# Patient Record
Sex: Male | Born: 1950
Health system: Southern US, Community
[De-identification: ages and names within clinical notes are randomized; demographics above are authoritative.]

## PROBLEM LIST (undated history)

## (undated) DIAGNOSIS — R079 Chest pain, unspecified: Secondary | ICD-10-CM

## (undated) DIAGNOSIS — E119 Type 2 diabetes mellitus without complications: Secondary | ICD-10-CM

## (undated) DIAGNOSIS — G4730 Sleep apnea, unspecified: Secondary | ICD-10-CM

## (undated) DIAGNOSIS — R011 Cardiac murmur, unspecified: Secondary | ICD-10-CM

## (undated) DIAGNOSIS — E349 Endocrine disorder, unspecified: Secondary | ICD-10-CM

## (undated) DIAGNOSIS — K219 Gastro-esophageal reflux disease without esophagitis: Secondary | ICD-10-CM

## (undated) DIAGNOSIS — I10 Essential (primary) hypertension: Secondary | ICD-10-CM

## (undated) DIAGNOSIS — R5383 Other fatigue: Secondary | ICD-10-CM

## (undated) DIAGNOSIS — Z79899 Other long term (current) drug therapy: Secondary | ICD-10-CM

## (undated) DIAGNOSIS — E669 Obesity, unspecified: Secondary | ICD-10-CM

## (undated) DIAGNOSIS — G4733 Obstructive sleep apnea (adult) (pediatric): Secondary | ICD-10-CM

## (undated) DIAGNOSIS — F419 Anxiety disorder, unspecified: Secondary | ICD-10-CM

## (undated) DIAGNOSIS — E559 Vitamin D deficiency, unspecified: Secondary | ICD-10-CM

## (undated) DIAGNOSIS — T7840XA Allergy, unspecified, initial encounter: Secondary | ICD-10-CM

## (undated) DIAGNOSIS — E785 Hyperlipidemia, unspecified: Secondary | ICD-10-CM

## (undated) DIAGNOSIS — K589 Irritable bowel syndrome without diarrhea: Secondary | ICD-10-CM

## (undated) DIAGNOSIS — I341 Nonrheumatic mitral (valve) prolapse: Secondary | ICD-10-CM

## (undated) HISTORY — DX: Cardiac murmur, unspecified: R01.1

## (undated) HISTORY — DX: Endocrine disorder, unspecified: E34.9

## (undated) HISTORY — DX: Type 2 diabetes mellitus without complications: E11.9

## (undated) HISTORY — DX: Allergy, unspecified, initial encounter: T78.40XA

## (undated) HISTORY — DX: Sleep apnea, unspecified: G47.30

## (undated) HISTORY — DX: Anxiety disorder, unspecified: F41.9

## (undated) HISTORY — DX: Other fatigue: R53.83

## (undated) HISTORY — DX: Nonrheumatic mitral (valve) prolapse: I34.1

## (undated) HISTORY — PX: WISDOM TOOTH EXTRACTION: SHX21

## (undated) HISTORY — DX: Gastro-esophageal reflux disease without esophagitis: K21.9

## (undated) HISTORY — DX: Obesity, unspecified: E66.9

## (undated) HISTORY — DX: Hyperlipidemia, unspecified: E78.5

## (undated) HISTORY — DX: Essential (primary) hypertension: I10

## (undated) HISTORY — DX: Irritable bowel syndrome, unspecified: K58.9

## (undated) HISTORY — DX: Other long term (current) drug therapy: Z79.899

## (undated) HISTORY — DX: Vitamin D deficiency, unspecified: E55.9

## (undated) HISTORY — DX: Chest pain, unspecified: R07.9

## (undated) HISTORY — PX: TONSILLECTOMY: SUR1361

## (undated) HISTORY — DX: Obstructive sleep apnea (adult) (pediatric): G47.33

## (undated) HISTORY — PX: COLONOSCOPY: SHX174

---

## 1999-08-29 ENCOUNTER — Ambulatory Visit: Admission: RE | Admit: 1999-08-29 | Discharge: 1999-08-29 | Payer: Self-pay | Admitting: Otolaryngology

## 2000-09-08 ENCOUNTER — Encounter: Payer: Self-pay | Admitting: Emergency Medicine

## 2000-09-08 ENCOUNTER — Emergency Department (HOSPITAL_COMMUNITY): Admission: EM | Admit: 2000-09-08 | Discharge: 2000-09-08 | Payer: Self-pay | Admitting: Emergency Medicine

## 2003-10-30 ENCOUNTER — Ambulatory Visit (HOSPITAL_COMMUNITY): Admission: RE | Admit: 2003-10-30 | Discharge: 2003-10-30 | Payer: Self-pay | Admitting: Internal Medicine

## 2005-06-23 ENCOUNTER — Ambulatory Visit (HOSPITAL_COMMUNITY): Admission: RE | Admit: 2005-06-23 | Discharge: 2005-06-23 | Payer: Self-pay | Admitting: Internal Medicine

## 2007-04-05 ENCOUNTER — Ambulatory Visit: Payer: Self-pay | Admitting: Gastroenterology

## 2007-06-28 ENCOUNTER — Ambulatory Visit: Payer: Self-pay | Admitting: Gastroenterology

## 2009-05-29 ENCOUNTER — Emergency Department (HOSPITAL_COMMUNITY): Admission: EM | Admit: 2009-05-29 | Discharge: 2009-05-29 | Payer: Self-pay | Admitting: Emergency Medicine

## 2010-07-31 LAB — POCT I-STAT, CHEM 8
BUN: 11 mg/dL (ref 6–23)
Calcium, Ion: 1.24 mmol/L (ref 1.12–1.32)
Chloride: 105 mEq/L (ref 96–112)
Creatinine, Ser: 0.9 mg/dL (ref 0.4–1.5)
Glucose, Bld: 113 mg/dL — ABNORMAL HIGH (ref 70–99)
HCT: 40 % (ref 39.0–52.0)
Hemoglobin: 13.6 g/dL (ref 13.0–17.0)
Potassium: 3.7 mEq/L (ref 3.5–5.1)
Sodium: 140 mEq/L (ref 135–145)
TCO2: 27 mmol/L (ref 0–100)

## 2012-02-23 ENCOUNTER — Other Ambulatory Visit (HOSPITAL_COMMUNITY): Payer: Self-pay | Admitting: Internal Medicine

## 2012-02-23 ENCOUNTER — Ambulatory Visit (HOSPITAL_COMMUNITY)
Admission: RE | Admit: 2012-02-23 | Discharge: 2012-02-23 | Disposition: A | Discharge status: Home / Self Care | Payer: 59 | Source: Ambulatory Visit | Attending: Internal Medicine | Admitting: Internal Medicine

## 2012-02-23 DIAGNOSIS — E119 Type 2 diabetes mellitus without complications: Secondary | ICD-10-CM | POA: Insufficient documentation

## 2012-02-23 DIAGNOSIS — J984 Other disorders of lung: Secondary | ICD-10-CM | POA: Insufficient documentation

## 2012-02-23 DIAGNOSIS — Z0000 Encounter for general adult medical examination without abnormal findings: Secondary | ICD-10-CM | POA: Insufficient documentation

## 2012-02-23 DIAGNOSIS — I10 Essential (primary) hypertension: Secondary | ICD-10-CM | POA: Insufficient documentation

## 2012-02-23 DIAGNOSIS — J9819 Other pulmonary collapse: Secondary | ICD-10-CM | POA: Insufficient documentation

## 2012-03-04 ENCOUNTER — Other Ambulatory Visit: Payer: Self-pay | Admitting: Internal Medicine

## 2012-03-04 ENCOUNTER — Ambulatory Visit
Admission: RE | Admit: 2012-03-04 | Discharge: 2012-03-04 | Disposition: A | Discharge status: Home / Self Care | Payer: 59 | Source: Ambulatory Visit | Attending: Internal Medicine | Admitting: Internal Medicine

## 2012-03-04 ENCOUNTER — Other Ambulatory Visit: Payer: 59

## 2012-03-04 DIAGNOSIS — R1011 Right upper quadrant pain: Secondary | ICD-10-CM

## 2012-06-11 ENCOUNTER — Encounter: Payer: Self-pay | Admitting: Gastroenterology

## 2012-12-10 ENCOUNTER — Encounter: Payer: Self-pay | Admitting: Gastroenterology

## 2013-01-17 ENCOUNTER — Encounter: Payer: Self-pay | Admitting: Cardiovascular Disease

## 2013-01-17 ENCOUNTER — Encounter: Payer: Self-pay | Admitting: *Deleted

## 2013-01-17 DIAGNOSIS — E782 Mixed hyperlipidemia: Secondary | ICD-10-CM | POA: Insufficient documentation

## 2013-01-17 DIAGNOSIS — K219 Gastro-esophageal reflux disease without esophagitis: Secondary | ICD-10-CM | POA: Insufficient documentation

## 2013-01-17 DIAGNOSIS — I10 Essential (primary) hypertension: Secondary | ICD-10-CM | POA: Insufficient documentation

## 2013-01-17 DIAGNOSIS — F419 Anxiety disorder, unspecified: Secondary | ICD-10-CM | POA: Insufficient documentation

## 2013-01-20 ENCOUNTER — Ambulatory Visit: Payer: 59 | Admitting: Cardiovascular Disease

## 2013-02-19 ENCOUNTER — Encounter: Payer: Self-pay | Admitting: Cardiovascular Disease

## 2013-02-20 ENCOUNTER — Ambulatory Visit (INDEPENDENT_AMBULATORY_CARE_PROVIDER_SITE_OTHER): Payer: 59 | Admitting: Cardiovascular Disease

## 2013-02-20 ENCOUNTER — Encounter: Payer: Self-pay | Admitting: Cardiovascular Disease

## 2013-02-20 ENCOUNTER — Encounter (INDEPENDENT_AMBULATORY_CARE_PROVIDER_SITE_OTHER): Payer: Self-pay

## 2013-02-20 VITALS — BP 122/70 | HR 84 | Ht 73.0 in | Wt 239.0 lb

## 2013-02-20 DIAGNOSIS — R0609 Other forms of dyspnea: Secondary | ICD-10-CM

## 2013-02-20 DIAGNOSIS — R0989 Other specified symptoms and signs involving the circulatory and respiratory systems: Secondary | ICD-10-CM

## 2013-02-20 DIAGNOSIS — R0600 Dyspnea, unspecified: Secondary | ICD-10-CM

## 2013-02-20 DIAGNOSIS — R079 Chest pain, unspecified: Secondary | ICD-10-CM

## 2013-02-20 DIAGNOSIS — I10 Essential (primary) hypertension: Secondary | ICD-10-CM

## 2013-02-20 DIAGNOSIS — R5383 Other fatigue: Secondary | ICD-10-CM

## 2013-02-20 DIAGNOSIS — E785 Hyperlipidemia, unspecified: Secondary | ICD-10-CM

## 2013-02-20 DIAGNOSIS — E119 Type 2 diabetes mellitus without complications: Secondary | ICD-10-CM

## 2013-02-20 DIAGNOSIS — R5381 Other malaise: Secondary | ICD-10-CM

## 2013-02-20 NOTE — Progress Notes (Signed)
Patient ID: Wayne [name], male   DOB: 1950-09-24, 62 y.o.   MRN: [id number] 62 yo referred for multiple atypical cardiac complaints and family history of premature CAD/CVA.  Patient describes having echo 20 years ago and having MVP.  Has infrequent palpitations with flip flops No sustained rapid beats.  Has sharp shooting pains on left side of chest not always with exercise. Has some exertional dyspnea.  Says he had a normal ETT 10 years ago.  Sounds like it may have been nuclear.  Been on statin for 4 years and takes baby aspirin  Last TC was 179 11/23/12 with LDL 124  A1c is 6.6 and is on metformin.  Wants to be more active but scared that something may happen to his heart    ROS: Denies fever, malais, weight loss, blurry vision, decreased visual acuity, cough, sputum, SOB, hemoptysis, pleuritic pain, palpitaitons, heartburn, abdominal pain, melena, lower extremity edema, claudication, or rash.  All other systems reviewed and negative   General: Affect appropriate Healthy:  appears stated age HEENT: normal Neck supple with no adenopathy JVP normal no bruits no thyromegaly Lungs clear with no wheezing and good diaphragmatic motion Heart:  S1/S2 no murmur,rub, gallop or click PMI normal Abdomen: benighn, BS positve, no tenderness, no AAA no bruit.  No HSM or HJR Distal pulses intact with no bruits No edema Neuro non-focal Skin warm and dry No muscular weakness  Medications Current Outpatient Prescriptions  Medication Sig Dispense Refill  . ALPRAZolam (XANAX) 1 MG tablet Take 1 mg by mouth at bedtime as needed.       Marland Kitchen aspirin 81 MG tablet Take 81 mg by mouth daily.      Marland Kitchen atenolol (TENORMIN) 100 MG tablet Take 1 tablet by mouth daily.      . Cholecalciferol (VITAMIN D3) 2000 UNITS capsule Take 2,000 Units by mouth daily.      . citalopram (CELEXA) 40 MG tablet Take 1 tablet by mouth daily.      . CRESTOR 10 MG tablet Take 1 tablet by mouth. On Monday, Wednesday & Friday      .  hyoscyamine (LEVBID) 0.375 MG 12 hr tablet Take 0.375 mg by mouth every 12 (twelve) hours as needed for cramping.      Marland Kitchen losartan-hydrochlorothiazide (HYZAAR) 100-25 MG per tablet       . Magnesium 400 MG CAPS Take by mouth daily.      . metFORMIN (GLUCOPHAGE-XR) 500 MG 24 hr tablet Take 1 tablet by mouth 2 (two) times daily.      . Probiotic Product (PROBIOTIC DAILY PO) Take by mouth daily.       No current facility-administered medications for this visit.    Allergies Review of patient's allergies indicates no known allergies.  Family History: Family History  Problem Relation Age of Onset  . Stroke    . Hypertension    . Hyperlipidemia    . Diabetes      Social History: History   Social History  . Marital Status: Married    Spouse Name: N/A    Number of Children: N/A  . Years of Education: N/A   Occupational History  . Not on file.   Social History Main Topics  . Smoking status: Former Games developer  . Smokeless tobacco: Not on file  . Alcohol Use: Yes     Comment: occasional  . Drug Use: No  . Sexual Activity: Not on file   Other Topics Concern  . Not on file  Social History Narrative  . No narrative on file    Electrocardiogram:  Assessment and Plan

## 2013-02-20 NOTE — Assessment & Plan Note (Signed)
Discussed low carb diet.  Target hemoglobin A1c is 6.5 or less.  Continue current medications.  

## 2013-02-20 NOTE — Patient Instructions (Signed)
Your physician recommends that you schedule a follow-up appointment in: AS  NEEDED Your physician recommends that you continue on your current medications as directed. Please refer to the Current Medication list given to you today.  Your physician has requested that you have an exercise tolerance test. For further information please visit [email or web address] Please also follow instruction sheet, as given.  Your physician has requested that you have an echocardiogram. Echocardiography is a painless test that uses sound waves to create images of your heart. It provides your doctor with information about the size and shape of your heart and how well your heart's chambers and valves are working. This procedure takes approximately one hour. There are no restrictions for this procedure.   CALCIUM  SCORE  OUT OF   POCKET  $150.00

## 2013-02-20 NOTE — Assessment & Plan Note (Addendum)
Atypical but multiple risk factors and wants to start exercise program  F/U ETT  Long term risk event can be assessed by calcium score and will help guide aggressiveness of chol and BS control

## 2013-02-20 NOTE — Assessment & Plan Note (Signed)
Dyspnea with history of MVP  F/u echo

## 2013-02-20 NOTE — Assessment & Plan Note (Signed)
If calcium score high may need higher dose of crestor or vytorin

## 2013-02-20 NOTE — Assessment & Plan Note (Signed)
Well controlled.  Continue current medications and low sodium Dash type diet.    

## 2013-04-02 ENCOUNTER — Ambulatory Visit (INDEPENDENT_AMBULATORY_CARE_PROVIDER_SITE_OTHER)
Admission: RE | Admit: 2013-04-02 | Discharge: 2013-04-02 | Disposition: A | Discharge status: Home / Self Care | Payer: 59 | Source: Ambulatory Visit | Attending: Cardiovascular Disease | Admitting: Cardiovascular Disease

## 2013-04-02 ENCOUNTER — Ambulatory Visit (INDEPENDENT_AMBULATORY_CARE_PROVIDER_SITE_OTHER): Payer: 59 | Admitting: Physician Assistant

## 2013-04-02 ENCOUNTER — Encounter: Payer: Self-pay | Admitting: Cardiovascular Disease

## 2013-04-02 ENCOUNTER — Ambulatory Visit (HOSPITAL_COMMUNITY): Payer: 59 | Attending: Cardiovascular Disease | Admitting: Radiology

## 2013-04-02 DIAGNOSIS — I10 Essential (primary) hypertension: Secondary | ICD-10-CM | POA: Insufficient documentation

## 2013-04-02 DIAGNOSIS — R0609 Other forms of dyspnea: Secondary | ICD-10-CM

## 2013-04-02 DIAGNOSIS — R5381 Other malaise: Secondary | ICD-10-CM | POA: Insufficient documentation

## 2013-04-02 DIAGNOSIS — R0602 Shortness of breath: Secondary | ICD-10-CM

## 2013-04-02 DIAGNOSIS — R0989 Other specified symptoms and signs involving the circulatory and respiratory systems: Secondary | ICD-10-CM | POA: Insufficient documentation

## 2013-04-02 DIAGNOSIS — R0600 Dyspnea, unspecified: Secondary | ICD-10-CM

## 2013-04-02 DIAGNOSIS — R079 Chest pain, unspecified: Secondary | ICD-10-CM

## 2013-04-02 DIAGNOSIS — Z8249 Family history of ischemic heart disease and other diseases of the circulatory system: Secondary | ICD-10-CM | POA: Insufficient documentation

## 2013-04-02 DIAGNOSIS — R5383 Other fatigue: Secondary | ICD-10-CM

## 2013-04-02 DIAGNOSIS — Z87891 Personal history of nicotine dependence: Secondary | ICD-10-CM | POA: Insufficient documentation

## 2013-04-02 DIAGNOSIS — E785 Hyperlipidemia, unspecified: Secondary | ICD-10-CM | POA: Insufficient documentation

## 2013-04-02 NOTE — Progress Notes (Signed)
Echocardiogram performed.  

## 2013-04-02 NOTE — Progress Notes (Signed)
Exercise Treadmill Test  Pre-Exercise Testing Evaluation Rhythm: normal sinus  Rate: 61 bpm     Test  Exercise Tolerance Test Ordering MD: Charlton Haws, MD  Interpreting MD: Tereso Newcomer PA-C  Unique Test No: 1  Treadmill:  1  Indication for ETT: chest pain - rule out ischemia  Contraindication to ETT: No   Stress Modality: exercise - treadmill  Cardiac Imaging Performed: non   Protocol: standard Bruce - maximal  Max BP:  154/65  Max MPHR (bpm):  160 85% MPR (bpm):  136  MPHR obtained (bpm):  137 % MPHR obtained:  85  Reached 85% MPHR (min:sec):  85 Total Exercise Time (min-sec):  9:00  Workload in METS:  10.1 Borg Scale: 15  Reason ETT Terminated:  patient's desire to stop    ST Segment Analysis At Rest: normal ST segments - no evidence of significant ST depression With Exercise: non-specific ST changes  Other Information Arrhythmia:  No Angina during ETT:  absent (0) Quality of ETT:  diagnostic  ETT Interpretation:  normal - no evidence of ischemia by ST analysis  Comments: Good exercise capacity. No chest pain. Normal BP response to exercise. No significant ST changes to suggest ischemia.  There were occasional PVCs and rare ventricular couplets.  Recommendations: F/u with Dr. Charlton Haws as directed. Signed,  Tereso Newcomer, PA-C   04/02/2013 3:11 PM

## 2013-04-06 ENCOUNTER — Other Ambulatory Visit: Payer: Self-pay | Admitting: Internal Medicine

## 2013-04-16 ENCOUNTER — Other Ambulatory Visit: Payer: Self-pay | Admitting: Emergency Medicine

## 2013-04-25 ENCOUNTER — Ambulatory Visit: Payer: Self-pay | Admitting: Emergency Medicine

## 2013-04-25 ENCOUNTER — Ambulatory Visit: Payer: Self-pay | Admitting: Internal Medicine

## 2013-05-01 ENCOUNTER — Encounter: Payer: Self-pay | Admitting: Internal Medicine

## 2013-05-13 ENCOUNTER — Other Ambulatory Visit: Payer: Self-pay | Admitting: Emergency Medicine

## 2013-05-13 ENCOUNTER — Other Ambulatory Visit: Payer: Self-pay | Admitting: *Deleted

## 2013-05-13 MED ORDER — HYOSCYAMINE SULFATE ER 0.375 MG PO TB12: 0.3750 mg | ORAL_TABLET | Freq: Two times a day (BID) | ORAL | Status: DC | PRN

## 2013-05-13 MED ORDER — CITALOPRAM HYDROBROMIDE 40 MG PO TABS: 40.0000 mg | ORAL_TABLET | Freq: Every day | ORAL | Status: DC

## 2013-05-13 MED ORDER — ATENOLOL 100 MG PO TABS: 100.0000 mg | ORAL_TABLET | Freq: Every day | ORAL | Status: DC

## 2013-05-13 MED ORDER — ROSUVASTATIN CALCIUM 10 MG PO TABS: 10.0000 mg | ORAL_TABLET | Freq: Every day | ORAL | Status: DC

## 2013-05-13 MED ORDER — METFORMIN HCL ER 500 MG PO TB24: ORAL_TABLET | ORAL | Status: DC

## 2013-06-12 DIAGNOSIS — N182 Chronic kidney disease, stage 2 (mild): Secondary | ICD-10-CM

## 2013-06-12 DIAGNOSIS — G4733 Obstructive sleep apnea (adult) (pediatric): Secondary | ICD-10-CM | POA: Insufficient documentation

## 2013-06-12 DIAGNOSIS — E1122 Type 2 diabetes mellitus with diabetic chronic kidney disease: Secondary | ICD-10-CM | POA: Insufficient documentation

## 2013-06-12 DIAGNOSIS — K589 Irritable bowel syndrome without diarrhea: Secondary | ICD-10-CM | POA: Insufficient documentation

## 2013-06-13 ENCOUNTER — Encounter: Payer: Self-pay | Admitting: Internal Medicine

## 2013-06-13 DIAGNOSIS — E559 Vitamin D deficiency, unspecified: Secondary | ICD-10-CM | POA: Insufficient documentation

## 2013-06-13 DIAGNOSIS — Z7189 Other specified counseling: Secondary | ICD-10-CM | POA: Insufficient documentation

## 2013-06-13 DIAGNOSIS — Z79899 Other long term (current) drug therapy: Secondary | ICD-10-CM

## 2013-06-13 HISTORY — DX: Other long term (current) drug therapy: Z79.899

## 2013-06-13 NOTE — Patient Instructions (Signed)

## 2013-06-13 NOTE — Progress Notes (Signed)
Patient ID: Wayne [name], male   DOB: [birth year], 63 y.o.   MRN: [id number]   This very nice 63 y.o. male presents for 3 month follow up with Hypertension, Hyperlipidemia, Pre-Diabetes and Vitamin D Deficiency.    HTN predates since   . BP has been controlled at home. Today's   . Patient denies any cardiac type chest pain, palpitations, dyspnea/orthopnea/PND, dizziness, claudication, or dependent edema.   Hyperlipidemia is controlled with diet & meds. Last Cholesterol was  , Triglycerides were    , HDL    and LDL    . Patient denies myalgias or other med SE's.    Also, the patient has history of PreDiabetes/insulin resistance since     with last A1c of    . Patient denies any symptoms of reactive hypoglycemia, diabetic polys, paresthesias or visual blurring.   Further, Patient has history of Vitamin D Deficiency with last vitamin D of   . Patient supplements vitamin D without any suspected side-effects.    Medication List       This list is accurate as of: 06/13/13  2:45 AM.  Always use your most recent med list.               ALPRAZolam 1 MG tablet  Commonly known as:  XANAX  Take 1 mg by mouth at bedtime as needed.     aspirin 81 MG tablet  Take 81 mg by mouth daily.     atenolol 100 MG tablet  Commonly known as:  TENORMIN  Take 1 tablet (100 mg total) by mouth daily.     citalopram 40 MG tablet  Commonly known as:  CELEXA  Take 1 tablet (40 mg total) by mouth daily.     hyoscyamine 0.375 MG 12 hr tablet  Commonly known as:  LEVBID  Take 1 tablet (0.375 mg total) by mouth every 12 (twelve) hours as needed for cramping.     losartan-hydrochlorothiazide 100-25 MG per tablet  Commonly known as:  HYZAAR  TAKE 1 TABLET BY MOUTH DAILY     Magnesium 400 MG Caps  Take by mouth daily.     metFORMIN 500 MG 24 hr tablet  Commonly known as:  GLUCOPHAGE-XR  Take 2 pills BID     PROBIOTIC DAILY PO  Take by mouth daily.     rosuvastatin 10 MG tablet  Commonly known as:   CRESTOR  Take 1 tablet (10 mg total) by mouth daily. On Monday, Wednesday & Friday     Vitamin D3 2000 UNITS capsule  Take 2,000 Units by mouth daily.         No Known Allergies  PMHx:   Past Medical History  Diagnosis Date  . Hyperlipemia   . Fatigue   . Chest pain   . GERD (gastroesophageal reflux disease)   . Anxiety     On Xanax  . Vitamin D deficiency   . OSA (obstructive sleep apnea)     Restated CPAP  . IBS (irritable bowel syndrome)   . HTN (hypertension)   . Type II or unspecified type diabetes mellitus without mention of complication, not stated as uncontrolled     FHx:    Reviewed / unchanged  SHx:    Reviewed / unchanged  Systems Review: Constitutional: Denies fever, chills, wt changes, headaches, insomnia, fatigue, night sweats, change in appetite. Eyes: Denies redness, blurred vision, diplopia, discharge, itchy, watery eyes.  ENT: Denies discharge, congestion, post nasal drip, epistaxis, sore throat, earache, hearing  loss, dental pain, tinnitus, vertigo, sinus pain, snoring.  CV: Denies chest pain, palpitations, irregular heartbeat, syncope, dyspnea, diaphoresis, orthopnea, PND, claudication, edema. Respiratory: denies cough, dyspnea, DOE, pleurisy, hoarseness, laryngitis, wheezing.  Gastrointestinal: Denies dysphagia, odynophagia, heartburn, reflux, water brash, abdominal pain or cramps, nausea, vomiting, bloating, diarrhea, constipation, hematemesis, melena, hematochezia,  or hemorrhoids. Genitourinary: Denies dysuria, frequency, urgency, nocturia, hesitancy, discharge, hematuria, flank pain. Musculoskeletal: Denies arthralgias, myalgias, stiffness, jt. swelling, pain, limp, strain/sprain.  Skin: Denies pruritus, rash, hives, warts, acne, eczema, change in skin lesion(s). Neuro: No weakness, tremor, incoordination, spasms, paresthesia, or pain. Psychiatric: Denies confusion, memory loss, or sensory loss. Endo: Denies change in weight, skin, hair change.   Heme/Lymph: No excessive bleeding, bruising, orenlarged lymph nodes.  There were no vitals filed for this visit.  Estimated body mass index is 31.54 kg/(m^2) as calculated from the following:   Height as of 02/20/13: 6\' 1"  (1.854 m).   Weight as of 02/20/13: 239 lb (108.41 kg).  On Exam: Appears well nourished - in no distress. Eyes: PERRLA, EOMs, conjunctiva no swelling or erythema. Sinuses: No frontal/maxillary tenderness ENT/Mouth: EAC's clear, TM's nl w/o erythema, bulging. Nares clear w/o erythema, swelling, exudates. Oropharynx clear without erythema or exudates. Oral hygiene is good. Tongue normal, non obstructing. Hearing intact.  Neck: Supple. Thyroid nl. Car 2+/2+ without bruits, nodes or JVD. Chest: Respirations nl with BS clear & equal w/o rales, rhonchi, wheezing or stridor.  Cor: Heart sounds normal w/ regular rate and rhythm without sig. murmurs, gallops, clicks, or rubs. Peripheral pulses normal and equal  without edema.  Abdomen: Soft & bowel sounds normal. Non-tender w/o guarding, rebound, hernias, masses, or organomegaly.  Lymphatics: Unremarkable.  Musculoskeletal: Full ROM all peripheral extremities, joint stability, 5/5 strength, and normal gait.  Skin: Warm, dry without exposed rashes, lesions, ecchymosis apparent.  Neuro: Cranial nerves intact, reflexes equal bilaterally. Sensory-motor testing grossly intact. Tendon reflexes grossly intact.  Pysch: Alert & oriented x 3. Insight and judgement nl & appropriate. No ideations.  Assessment and Plan:  1. Hypertension - Continue monitor blood pressure at home. Continue diet/meds same.  2. Hyperlipidemia - Continue diet/meds, exercise,& lifestyle modifications. Continue monitor periodic cholesterol/liver & renal functions   3. Pre-diabetes/Insulin Resistance - Continue diet, exercise, lifestyle modifications. Monitor appropriate labs.  3. Diabetes - continue recommend prudent low glycemic diet, weight control, regular  exercise, diabetic monitoring and periodic eye exams.  4. Vitamin D Deficiency - Continue supplementation.  Recommended regular exercise, BP monitoring, weight control, and discussed med and SE's. Recommended labs to assess and monitor clinical status. Further disposition pending results of labs.  This encounter was created in error - please disregard.

## 2013-06-16 ENCOUNTER — Encounter: Payer: Self-pay | Admitting: Internal Medicine

## 2013-06-16 ENCOUNTER — Encounter: Payer: Self-pay | Admitting: Physician Assistant

## 2013-07-18 ENCOUNTER — Ambulatory Visit (INDEPENDENT_AMBULATORY_CARE_PROVIDER_SITE_OTHER): Payer: 59 | Admitting: Internal Medicine

## 2013-07-18 ENCOUNTER — Encounter: Payer: Self-pay | Admitting: Internal Medicine

## 2013-07-18 VITALS — BP 104/76 | HR 68 | Temp 97.3°F | Resp 16 | Ht 78.0 in | Wt 237.8 lb

## 2013-07-18 DIAGNOSIS — Z79899 Other long term (current) drug therapy: Secondary | ICD-10-CM

## 2013-07-18 DIAGNOSIS — E559 Vitamin D deficiency, unspecified: Secondary | ICD-10-CM

## 2013-07-18 DIAGNOSIS — E785 Hyperlipidemia, unspecified: Secondary | ICD-10-CM

## 2013-07-18 DIAGNOSIS — I10 Essential (primary) hypertension: Secondary | ICD-10-CM

## 2013-07-18 DIAGNOSIS — E119 Type 2 diabetes mellitus without complications: Secondary | ICD-10-CM

## 2013-07-18 LAB — CBC WITH DIFFERENTIAL/PLATELET
BASOS ABS: 0.1 10*3/uL (ref 0.0–0.1)
BASOS PCT: 1 % (ref 0–1)
Eosinophils Absolute: 0.3 10*3/uL (ref 0.0–0.7)
Eosinophils Relative: 6 % — ABNORMAL HIGH (ref 0–5)
HEMATOCRIT: 40 % (ref 39.0–52.0)
Hemoglobin: 13.9 g/dL (ref 13.0–17.0)
LYMPHS PCT: 55 % — AB (ref 12–46)
Lymphs Abs: 3.2 10*3/uL (ref 0.7–4.0)
MCH: 31.2 pg (ref 26.0–34.0)
MCHC: 34.8 g/dL (ref 30.0–36.0)
MCV: 89.9 fL (ref 78.0–100.0)
Monocytes Absolute: 0.4 10*3/uL (ref 0.1–1.0)
Monocytes Relative: 7 % (ref 3–12)
NEUTROS ABS: 1.8 10*3/uL (ref 1.7–7.7)
NEUTROS PCT: 31 % — AB (ref 43–77)
Platelets: 315 10*3/uL (ref 150–400)
RBC: 4.45 MIL/uL (ref 4.22–5.81)
RDW: 13.3 % (ref 11.5–15.5)
WBC: 5.8 10*3/uL (ref 4.0–10.5)

## 2013-07-18 LAB — HEMOGLOBIN A1C
Hgb A1c MFr Bld: 6.8 % — ABNORMAL HIGH (ref <5.7)
MEAN PLASMA GLUCOSE: 148 mg/dL — AB (ref <117)

## 2013-07-18 NOTE — Patient Instructions (Signed)

## 2013-07-18 NOTE — Progress Notes (Signed)
Patient ID: Wayne [name], male   DOB: [birth year], 63 y.o.   MRN: [id number]    This very nice 63 y.o. MBM presents for 3 month follow up with Hypertension, Hyperlipidemia, OSA/CPAP,  T2 NIDDM and Vitamin D Deficiency.    HTN predates since 1989. BP has been controlled at home. Today's BP: 104/76 mmHg . In Nov 2014 he had a normal 2DEC and negative ETT by Dr Melburn Popper. Patient denies any cardiac type chest pain, palpitations, dyspnea/orthopnea/PND, dizziness, claudication, or dependent edema.   Hyperlipidemia is not controlled with diet & meds. Last Cholesterol was 179 , Triglycerides were 91, HDL 37  and LDL 124 in July 2014 - not at goal. Patient denies myalgias or other med SE's.    Also, the patient has history of Obesity (BMI 32) T2 NIDDM since 2007 with last A1c of 6.6% in July 2014.  GFR was 79 in July 2014 consistent with Stage I CKD. He infrequently checks blood sugars . Patient denies any symptoms of reactive hypoglycemia, diabetic polys, paresthesias or visual blurring.   Further, Patient has history of Vitamin D Deficiency of 22 in 2008 with last vitamin D of 79 in July 2014. Patient supplements vitamin D without any suspected side-effects.    Medication List       This list is accurate as of: 07/18/13  3:31 PM.  Always use your most recent med list.               ALPRAZolam 1 MG tablet  Commonly known as:  XANAX  Take 1 mg by mouth at bedtime as needed.     aspirin 81 MG tablet  Take 81 mg by mouth daily.     atenolol 100 MG tablet  Commonly known as:  TENORMIN  Take 1 tablet (100 mg total) by mouth daily.     citalopram 40 MG tablet  Commonly known as:  CELEXA  Take 1 tablet (40 mg total) by mouth daily.     hyoscyamine 0.375 MG 12 hr tablet  Commonly known as:  LEVBID  Take 1 tablet (0.375 mg total) by mouth every 12 (twelve) hours as needed for cramping.     losartan-hydrochlorothiazide 100-25 MG per tablet  Commonly known as:  HYZAAR  TAKE 1 TABLET BY MOUTH DAILY      Magnesium 400 MG Caps  Take by mouth 2 (two) times daily.     metFORMIN 500 MG 24 hr tablet  Commonly known as:  GLUCOPHAGE-XR  Take 2 pills BID     PROBIOTIC DAILY PO  Take by mouth daily.     rosuvastatin 10 MG tablet  Commonly known as:  CRESTOR  Take 1 tablet (10 mg total) by mouth daily. On Monday, Wednesday & Friday     Vitamin D3 2000 UNITS capsule  Take 2,000 Units by mouth daily.         No Known Allergies  PMHx:   Past Medical History  Diagnosis Date  . Hyperlipemia   . Fatigue   . Chest pain   . GERD (gastroesophageal reflux disease)   . Anxiety     On Xanax  . Vitamin D deficiency   . OSA (obstructive sleep apnea)     Restated CPAP  . IBS (irritable bowel syndrome)   . HTN (hypertension)   . Type II or unspecified type diabetes mellitus without mention of complication, not stated as uncontrolled     FHx:    Reviewed / unchanged  SHx:  Reviewed / unchanged  Systems Review: Constitutional: Denies fever, chills, wt changes, headaches, insomnia, fatigue, night sweats, change in appetite. Eyes: Denies redness, blurred vision, diplopia, discharge, itchy, watery eyes.  ENT: Denies discharge, congestion, post nasal drip, epistaxis, sore throat, earache, hearing loss, dental pain, tinnitus, vertigo, sinus pain, snoring.  CV: Denies chest pain, palpitations, irregular heartbeat, syncope, dyspnea, diaphoresis, orthopnea, PND, claudication, edema. Respiratory: denies cough, dyspnea, DOE, pleurisy, hoarseness, laryngitis, wheezing.  Gastrointestinal: Denies dysphagia, odynophagia, heartburn, reflux, water brash, abdominal pain or cramps, nausea, vomiting, bloating, diarrhea, constipation, hematemesis, melena, hematochezia,  or hemorrhoids. Genitourinary: Denies dysuria, frequency, urgency, nocturia, hesitancy, discharge, hematuria, flank pain. Musculoskeletal: Denies arthralgias, myalgias, stiffness, jt. swelling, pain, limp, strain/sprain.  Skin: Denies  pruritus, rash, hives, warts, acne, eczema, change in skin lesion(s). Neuro: No weakness, tremor, incoordination, spasms, paresthesia, or pain. Psychiatric: Denies confusion, memory loss, or sensory loss. Endo: Denies change in weight, skin, hair change.  Heme/Lymph: No excessive bleeding, bruising, orenlarged lymph nodes.  BP: 104/76  Pulse: 68  Temp: 97.3 F (36.3 C)  Resp: 16    Estimated body mass index is 27.49 kg/(m^2) as calculated from the following:   Height as of this encounter: 6\' 6"  (1.981 m).   Weight as of this encounter: 237 lb 12.8 oz (107.865 kg).  On Exam: Appears well nourished - in no distress. Eyes: PERRLA, EOMs, conjunctiva no swelling or erythema. Sinuses: No frontal/maxillary tenderness ENT/Mouth: EAC's clear, TM's nl w/o erythema, bulging. Nares clear w/o erythema, swelling, exudates. Oropharynx clear without erythema or exudates. Oral hygiene is good. Tongue normal, non obstructing. Hearing intact.  Neck: Supple. Thyroid nl. Car 2+/2+ without bruits, nodes or JVD. Chest: Respirations nl with BS clear & equal w/o rales, rhonchi, wheezing or stridor.  Cor: Heart sounds normal w/ regular rate and rhythm without sig. murmurs, gallops, clicks, or rubs. Peripheral pulses normal and equal  without edema.  Abdomen: Soft & bowel sounds normal. Non-tender w/o guarding, rebound, hernias, masses, or organomegaly.  Lymphatics: Unremarkable.  Musculoskeletal: Full ROM all peripheral extremities, joint stability, 5/5 strength, and normal gait.  Skin: Warm, dry without exposed rashes, lesions, ecchymosis apparent.  Neuro: Cranial nerves intact, reflexes equal bilaterally. Sensory-motor testing grossly intact. Tendon reflexes grossly intact.  Pysch: Alert & oriented x 3. Insight and judgement nl & appropriate. No ideations.  Assessment and Plan:  1. Hypertension - at goal - Continue monitor blood pressure at home. Continue diet/meds same.  2. Hyperlipidemia - not at goal  - Continue diet/meds, exercise,& lifestyle modifications. Continue monitor periodic cholesterol/liver & renal functions   3. T2 NIDDM w/ Stage I CKD- Continue recommend prudent low glycemic diet, weight control, regular exercise, diabetic monitoring and periodic eye exams.  4. Vitamin D Deficiency - Continue supplementation.  5. IBS  Recommended regular exercise, BP monitoring, weight control, and discussed med and SE's. Recommended labs to assess and monitor clinical status. Further disposition pending results of labs.

## 2013-07-19 LAB — HEPATIC FUNCTION PANEL
ALK PHOS: 51 U/L (ref 39–117)
ALT: 16 U/L (ref 0–53)
AST: 17 U/L (ref 0–37)
Albumin: 4.3 g/dL (ref 3.5–5.2)
BILIRUBIN DIRECT: 0.1 mg/dL (ref 0.0–0.3)
BILIRUBIN INDIRECT: 0.5 mg/dL (ref 0.2–1.2)
BILIRUBIN TOTAL: 0.6 mg/dL (ref 0.2–1.2)
TOTAL PROTEIN: 7 g/dL (ref 6.0–8.3)

## 2013-07-19 LAB — LIPID PANEL
CHOLESTEROL: 158 mg/dL (ref 0–200)
HDL: 36 mg/dL — AB (ref >39)
LDL CALC: 104 mg/dL — AB (ref 0–99)
TRIGLYCERIDES: 90 mg/dL (ref <150)
Total CHOL/HDL Ratio: 4.4 Ratio
VLDL: 18 mg/dL (ref 0–40)

## 2013-07-19 LAB — BASIC METABOLIC PANEL WITH GFR
BUN: 17 mg/dL (ref 6–23)
CHLORIDE: 101 meq/L (ref 96–112)
CO2: 31 mEq/L (ref 19–32)
Calcium: 9.9 mg/dL (ref 8.4–10.5)
Creat: 0.88 mg/dL (ref 0.50–1.35)
Glucose, Bld: 99 mg/dL (ref 70–99)
POTASSIUM: 4.2 meq/L (ref 3.5–5.3)
SODIUM: 140 meq/L (ref 135–145)

## 2013-07-19 LAB — TSH: TSH: 1.262 u[IU]/mL (ref 0.350–4.500)

## 2013-07-19 LAB — VITAMIN D 25 HYDROXY (VIT D DEFICIENCY, FRACTURES): Vit D, 25-Hydroxy: 61 ng/mL (ref 30–89)

## 2013-07-19 LAB — MAGNESIUM: Magnesium: 1.6 mg/dL (ref 1.5–2.5)

## 2013-07-19 LAB — INSULIN, FASTING: Insulin fasting, serum: 20 u[IU]/mL (ref 3–28)

## 2013-08-18 ENCOUNTER — Other Ambulatory Visit: Payer: Self-pay | Admitting: Emergency Medicine

## 2013-09-04 ENCOUNTER — Other Ambulatory Visit: Payer: Self-pay | Admitting: Emergency Medicine

## 2013-09-05 ENCOUNTER — Ambulatory Visit (INDEPENDENT_AMBULATORY_CARE_PROVIDER_SITE_OTHER): Payer: 59 | Admitting: Emergency Medicine

## 2013-09-05 ENCOUNTER — Encounter: Payer: Self-pay | Admitting: Emergency Medicine

## 2013-09-05 VITALS — BP 130/72 | HR 80 | Temp 98.6°F | Resp 18 | Ht 72.0 in | Wt 240.0 lb

## 2013-09-05 DIAGNOSIS — L259 Unspecified contact dermatitis, unspecified cause: Secondary | ICD-10-CM

## 2013-09-05 DIAGNOSIS — L309 Dermatitis, unspecified: Secondary | ICD-10-CM

## 2013-09-05 MED ORDER — AZITHROMYCIN 250 MG PO TABS: ORAL_TABLET | ORAL | Status: AC

## 2013-09-05 MED ORDER — PREDNISONE 10 MG PO TABS: ORAL_TABLET | ORAL | Status: DC

## 2013-09-05 MED ORDER — CLOTRIMAZOLE-BETAMETHASONE 1-0.05 % EX CREA: 1.0000 "application " | TOPICAL_CREAM | Freq: Two times a day (BID) | CUTANEOUS | Status: DC

## 2013-09-05 NOTE — Patient Instructions (Signed)
Folliculitis  Folliculitis is redness, soreness, and swelling (inflammation) of the hair follicles. This condition can occur anywhere on the body. People with weakened immune systems, diabetes, or obesity have a greater risk of getting folliculitis. CAUSES  Bacterial infection. This is the most common cause.  Fungal infection.  Viral infection.  Contact with certain chemicals, especially oils and tars. Long-term folliculitis can result from bacteria that live in the nostrils. The bacteria may trigger multiple outbreaks of folliculitis over time. SYMPTOMS Folliculitis most commonly occurs on the scalp, thighs, legs, back, buttocks, and areas where hair is shaved frequently. An early sign of folliculitis is a small, white or yellow, pus-filled, itchy lesion (pustule). These lesions appear on a red, inflamed follicle. They are usually less than 0.2 inches (5 mm) wide. When there is an infection of the follicle that goes deeper, it becomes a boil or furuncle. A group of closely packed boils creates a larger lesion (carbuncle). Carbuncles tend to occur in hairy, sweaty areas of the body. DIAGNOSIS  Your caregiver can usually tell what is wrong by doing a physical exam. A sample may be taken from one of the lesions and tested in a lab. This can help determine what is causing your folliculitis. TREATMENT  Treatment may include:  Applying warm compresses to the affected areas.  Taking antibiotic medicines orally or applying them to the skin.  Draining the lesions if they contain a large amount of pus or fluid.  Laser hair removal for cases of long-lasting folliculitis. This helps to prevent regrowth of the hair. HOME CARE INSTRUCTIONS  Apply warm compresses to the affected areas as directed by your caregiver.  If antibiotics are prescribed, take them as directed. Finish them even if you start to feel better.  You may take over-the-counter medicines to relieve itching.  Do not shave  irritated skin.  Follow up with your caregiver as directed. SEEK IMMEDIATE MEDICAL CARE IF:   You have increasing redness, swelling, or pain in the affected area.  You have a fever. MAKE SURE YOU:  Understand these instructions.  Will watch your condition.  Will get help right away if you are not doing well or get worse. Document Released: 07/10/2001 Document Revised: 10/31/2011 Document Reviewed: 08/01/2011 [employer] Patient Information 2014 [employer].  

## 2013-09-05 NOTE — Progress Notes (Signed)
   Subjective:    Patient ID: Wayne [name], male    DOB: 07/20/1950, 63 y.o.   MRN: [id number]  HPI Comments: 63 yo male with rash on back of neck x 1 week. He has had similar type rash in past and was treated with Zpak, Clotrimazole/ betamethasone with relief. He notes he shaved his head then went out to clean gutters before rash came. He also notes left hand has been itching since then.   Rash   Current Outpatient Prescriptions on File Prior to Visit  Medication Sig Dispense Refill  . ALPRAZolam (XANAX) 1 MG tablet Take 1 mg by mouth at bedtime as needed.       Marland Kitchen. aspirin 81 MG tablet Take 81 mg by mouth daily.      Marland Kitchen. atenolol (TENORMIN) 100 MG tablet Take 1 tablet (100 mg total) by mouth daily.  90 tablet  0  . Cholecalciferol (VITAMIN D3) 2000 UNITS capsule Take 2,000 Units by mouth daily.      . citalopram (CELEXA) 40 MG tablet Take 1 tablet (40 mg total) by mouth daily.  90 tablet  0  . hyoscyamine (LEVBID) 0.375 MG 12 hr tablet Take 1 tablet (0.375 mg total) by mouth every 12 (twelve) hours as needed for cramping.  90 tablet  0  . losartan-hydrochlorothiazide (HYZAAR) 100-25 MG per tablet TAKE 1 TABLET BY MOUTH EVERY DAY  90 tablet  0  . Magnesium 400 MG CAPS Take by mouth 2 (two) times daily.       . metFORMIN (GLUCOPHAGE-XR) 500 MG 24 hr tablet Take 2 pills BID  360 tablet  0  . Probiotic Product (PROBIOTIC DAILY PO) Take by mouth daily.      . rosuvastatin (CRESTOR) 10 MG tablet Take 1 tablet (10 mg total) by mouth daily. On Monday, Wednesday & Friday  90 tablet  0   No current facility-administered medications on file prior to visit.   No Known Allergies Past Medical History  Diagnosis Date  . Hyperlipemia   . Fatigue   . Chest pain   . GERD (gastroesophageal reflux disease)   . Anxiety     On Xanax  . Vitamin D deficiency   . OSA (obstructive sleep apnea)     Restated CPAP  . IBS (irritable bowel syndrome)   . HTN (hypertension)   . Type II or unspecified type  diabetes mellitus without mention of complication, not stated as uncontrolled       Review of Systems  Skin: Positive for rash.  All other systems reviewed and are negative.  BP 130/72  Pulse 80  Temp(Src) 98.6 F (37 C) (Temporal)  Resp 18  Ht 6' (1.829 m)  Wt 240 lb (108.863 kg)  BMI 32.54 kg/m2     Objective:   Physical Exam  Nursing note and vitals reviewed. Constitutional: He is oriented to person, place, and time. He appears well-developed and well-nourished.  HENT:  Head: Normocephalic and atraumatic.    Right Ear: External ear normal.  Left Ear: External ear normal.  Neurological: He is alert and oriented to person, place, and time.  Skin: Skin is warm and dry. Rash noted.  Psychiatric: He has a normal mood and affect. Judgment normal.          Assessment & Plan:  Folliculitis/Dermatitis- Prob related to shaving head. Advised of hygiene. Pred Dp 10 mg, Lotrisone cream, Zpak all AD

## 2013-10-10 ENCOUNTER — Other Ambulatory Visit: Payer: Self-pay | Admitting: Emergency Medicine

## 2013-10-24 ENCOUNTER — Ambulatory Visit: Payer: Self-pay | Admitting: Emergency Medicine

## 2013-11-28 ENCOUNTER — Other Ambulatory Visit: Payer: Self-pay | Admitting: Internal Medicine

## 2013-12-08 ENCOUNTER — Other Ambulatory Visit: Payer: Self-pay | Admitting: Emergency Medicine

## 2014-01-05 ENCOUNTER — Other Ambulatory Visit: Payer: Self-pay | Admitting: Emergency Medicine

## 2014-01-09 ENCOUNTER — Encounter: Payer: Self-pay | Admitting: Physician Assistant

## 2014-01-09 ENCOUNTER — Ambulatory Visit (INDEPENDENT_AMBULATORY_CARE_PROVIDER_SITE_OTHER): Payer: 59 | Admitting: Physician Assistant

## 2014-01-09 VITALS — BP 120/80 | HR 72 | Temp 97.7°F | Resp 16 | Ht 72.0 in | Wt 243.0 lb

## 2014-01-09 DIAGNOSIS — M26609 Unspecified temporomandibular joint disorder, unspecified side: Secondary | ICD-10-CM

## 2014-01-09 DIAGNOSIS — J069 Acute upper respiratory infection, unspecified: Secondary | ICD-10-CM

## 2014-01-09 MED ORDER — AZITHROMYCIN 250 MG PO TABS: ORAL_TABLET | ORAL | Status: AC

## 2014-01-09 MED ORDER — BACLOFEN 10 MG PO TABS: 10.0000 mg | ORAL_TABLET | Freq: Two times a day (BID) | ORAL | Status: DC

## 2014-01-09 NOTE — Patient Instructions (Signed)
Add an allergy pill from over the counter like allegra, zyrtec, or claritin daily If you get worse fever, green mucus then get on zpak  Tylenol is safe for you blood pressure Aleve, advil, ibuprofen can increase your blood pressure but are safe to take for small amount of time for pain with food.   What is the TMJ? The temporomandibular (tem-PUH-ro-man-DIB-yoo-ler) joint, or the TMJ, connects the upper and lower jawbones. This joint allows the jaw to open wide and move back and forth when you chew, talk, or yawn.There are also several muscles that help this joint move. There can be muscle tightness and pain in the muscle that can cause several symptoms.  What causes TMJ pain? There are many causes of TMJ pain. Repeated chewing (for example, chewing gum) and clenching your teeth can cause pain in the joint. Some TMJ pain has no obvious cause. What can I do to ease the pain? There are many things you can do to help your pain get better. When you have pain:  Eat soft foods and stay away from chewy foods (for example, taffy) Try to use both sides of your mouth to chew Don't chew gum Don't open your mouth wide (for example, during yawning or singing) Don't bite your cheeks or fingernails Lower your amount of stress and worry Massage Can take the muscle relaxer as needed AT NIGHT Applying a warm, damp washcloth to the joint may help. Over-the-counter pain medicines such as ibuprofen (one brand: Advil) or acetaminophen (one brand: Tylenol) might also help. Do not use these medicines if you are allergic to them or if your doctor told you not to use them. How can I stop the pain from coming back? When your pain is better, you can do these exercises to make your muscles stronger and to keep the pain from coming back:  Resisted mouth opening: Place your thumb or two fingers under your chin and open your mouth slowly, pushing up lightly on your chin with your thumb. Hold for three to six seconds. Close  your mouth slowly. Resisted mouth closing: Place your thumbs under your chin and your two index fingers on the ridge between your mouth and the bottom of your chin. Push down lightly on your chin as you close your mouth. Tongue up: Slowly open and close your mouth while keeping the tongue touching the roof of the mouth. Side-to-side jaw movement: Place an object about one fourth of an inch thick (for example, two tongue depressors) between your front teeth. Slowly move your jaw from side to side. Increase the thickness of the object as the exercise becomes easier Forward jaw movement: Place an object about one fourth of an inch thick between your front teeth and move the bottom jaw forward so that the bottom teeth are in front of the top teeth. Increase the thickness of the object as the exercise becomes easier. These exercises should not be painful. If it hurts to do these exercises, stop doing them and talk to your family doctor.

## 2014-01-09 NOTE — Progress Notes (Signed)
   Subjective:    Patient ID: Wayne [name], male    DOB: Nov 20, 1950, 63 y.o.   MRN: [id number]  Sore Throat  This is a new problem. The current episode started in the past 7 days (went to the dentist first but they told him to go tto PCP). The problem has been unchanged. Associated symptoms include congestion, ear pain (left ear) and headaches (left sided headache). Pertinent negatives include no abdominal pain, coughing, diarrhea, drooling, ear discharge, hoarse voice, plugged ear sensation, neck pain, shortness of breath, stridor, swollen glands, trouble swallowing or vomiting. He has tried nothing for the symptoms.      Review of Systems  Constitutional: Negative for fever, chills and diaphoresis.  HENT: Positive for congestion, ear pain (left ear), postnasal drip and rhinorrhea. Negative for drooling, ear discharge, hoarse voice, sinus pressure, sneezing, sore throat, trouble swallowing and voice change.   Eyes: Negative.   Respiratory: Negative for cough, chest tightness, shortness of breath, wheezing and stridor.   Cardiovascular: Negative.   Gastrointestinal: Negative.  Negative for vomiting, abdominal pain and diarrhea.  Genitourinary: Negative.   Musculoskeletal: Negative.  Negative for neck pain.  Neurological: Positive for headaches (left sided headache).       Objective:   Physical Exam  Constitutional: He appears well-developed and well-nourished.  HENT:  Head: Normocephalic and atraumatic.  Right Ear: External ear normal.  Left Ear: External ear normal.  Nose: Nose normal.  Mouth/Throat: Oropharynx is clear and moist. No oropharyngeal exudate.  + TMJ left ear  Eyes: Conjunctivae are normal. Pupils are equal, round, and reactive to light.  Neck: Normal range of motion. Neck supple.  Cardiovascular: Normal rate and regular rhythm.   Pulmonary/Chest: Effort normal and breath sounds normal.  Abdominal: Soft. Bowel sounds are normal.  Lymphadenopathy:    He has no  cervical adenopathy.      Assessment & Plan:  Viral versus TMJ- add allergy pill, will give zpak in case fever worse, likely TMJ too, information given to the patient, no gum/decrease hard foods, warm wet wash clothes, decrease stress, talk with dentist about possible night guard, can do massage, and exercise.

## 2014-01-20 ENCOUNTER — Other Ambulatory Visit: Payer: Self-pay | Admitting: Physician Assistant

## 2014-01-20 MED ORDER — CITALOPRAM HYDROBROMIDE 40 MG PO TABS: ORAL_TABLET | ORAL | Status: DC

## 2014-01-30 ENCOUNTER — Ambulatory Visit (INDEPENDENT_AMBULATORY_CARE_PROVIDER_SITE_OTHER): Payer: 59 | Admitting: Internal Medicine

## 2014-01-30 ENCOUNTER — Encounter: Payer: Self-pay | Admitting: Internal Medicine

## 2014-01-30 VITALS — BP 110/72 | HR 76 | Temp 97.6°F | Resp 16 | Ht 72.5 in | Wt 239.2 lb

## 2014-01-30 DIAGNOSIS — R7401 Elevation of levels of liver transaminase levels: Secondary | ICD-10-CM

## 2014-01-30 DIAGNOSIS — Z113 Encounter for screening for infections with a predominantly sexual mode of transmission: Secondary | ICD-10-CM

## 2014-01-30 DIAGNOSIS — Z125 Encounter for screening for malignant neoplasm of prostate: Secondary | ICD-10-CM

## 2014-01-30 DIAGNOSIS — K219 Gastro-esophageal reflux disease without esophagitis: Secondary | ICD-10-CM

## 2014-01-30 DIAGNOSIS — K589 Irritable bowel syndrome without diarrhea: Secondary | ICD-10-CM

## 2014-01-30 DIAGNOSIS — E785 Hyperlipidemia, unspecified: Secondary | ICD-10-CM

## 2014-01-30 DIAGNOSIS — Z111 Encounter for screening for respiratory tuberculosis: Secondary | ICD-10-CM

## 2014-01-30 DIAGNOSIS — I10 Essential (primary) hypertension: Secondary | ICD-10-CM

## 2014-01-30 DIAGNOSIS — R7402 Elevation of levels of lactic acid dehydrogenase (LDH): Secondary | ICD-10-CM

## 2014-01-30 DIAGNOSIS — Z0000 Encounter for general adult medical examination without abnormal findings: Secondary | ICD-10-CM

## 2014-01-30 DIAGNOSIS — R740 Nonspecific elevation of levels of transaminase and lactic acid dehydrogenase [LDH]: Secondary | ICD-10-CM

## 2014-01-30 DIAGNOSIS — E119 Type 2 diabetes mellitus without complications: Secondary | ICD-10-CM

## 2014-01-30 DIAGNOSIS — G4733 Obstructive sleep apnea (adult) (pediatric): Secondary | ICD-10-CM

## 2014-01-30 DIAGNOSIS — Z23 Encounter for immunization: Secondary | ICD-10-CM

## 2014-01-30 DIAGNOSIS — E559 Vitamin D deficiency, unspecified: Secondary | ICD-10-CM

## 2014-01-30 DIAGNOSIS — Z1212 Encounter for screening for malignant neoplasm of rectum: Secondary | ICD-10-CM

## 2014-01-30 LAB — CBC WITH DIFFERENTIAL/PLATELET
Basophils Absolute: 0 10*3/uL (ref 0.0–0.1)
Basophils Relative: 1 % (ref 0–1)
Eosinophils Absolute: 0.2 10*3/uL (ref 0.0–0.7)
Eosinophils Relative: 5 % (ref 0–5)
HCT: 38 % — ABNORMAL LOW (ref 39.0–52.0)
HEMOGLOBIN: 13.2 g/dL (ref 13.0–17.0)
LYMPHS ABS: 2.8 10*3/uL (ref 0.7–4.0)
Lymphocytes Relative: 58 % — ABNORMAL HIGH (ref 12–46)
MCH: 30.5 pg (ref 26.0–34.0)
MCHC: 34.7 g/dL (ref 30.0–36.0)
MCV: 87.8 fL (ref 78.0–100.0)
MONOS PCT: 6 % (ref 3–12)
Monocytes Absolute: 0.3 10*3/uL (ref 0.1–1.0)
NEUTROS ABS: 1.5 10*3/uL — AB (ref 1.7–7.7)
NEUTROS PCT: 30 % — AB (ref 43–77)
Platelets: 324 10*3/uL (ref 150–400)
RBC: 4.33 MIL/uL (ref 4.22–5.81)
RDW: 13.4 % (ref 11.5–15.5)
WBC: 4.9 10*3/uL (ref 4.0–10.5)

## 2014-01-30 LAB — HEMOGLOBIN A1C
HEMOGLOBIN A1C: 7.3 % — AB (ref <5.7)
Mean Plasma Glucose: 163 mg/dL — ABNORMAL HIGH (ref <117)

## 2014-01-30 NOTE — Progress Notes (Signed)
Patient ID: Wayne [name], male   DOB: [birth year], 63 y.o.   MRN: [id number]  Annual Preventative Comprehensive Examination  This very nice 63 y.o.mbm presents for complete physical.  Patient has been followed for HTN, T2_NIDDM, Hyperlipidemia, and Vitamin D Deficiency.   HTN predates since 1989. Patient's BP has been controlled at home.Today's BP: 110/72 mmHg. Patient denies any cardiac symptoms as chest pain, palpitations, shortness of breath, dizziness or ankle swelling.   Patient's hyperlipidemia is controlled with diet and medications. Patient denies myalgias or other medication SE's. Last lipids were Total Chol 158; HDL 36*; LDL 104*; Trig 90 on 07/18/2013.   Patient has T2_NIDDM (2007) and Stage 2 CKD with GFR 79 ml/min and patient denies reactive hypoglycemic symptoms, visual blurring, diabetic polys, or paresthesias. Last A1c was 6.8% on 07/18/2013.   Finally, patient has history of Vitamin D Deficiency (22 in 2008) and last vitamin D was on 07/18/2013.  Medication Sig  . ALPRAZolam 1 MG tablet Take 1 mg by mouth at bedtime as needed.   Marland Kitchen aspirin 81 MG tablet Take 81 mg by mouth daily.  Marland Kitchen atenolol  100 MG tablet TAKE 1 TABLET BY MOUTH EVERY DAY  . baclofen (LIORESAL) 10 MG tablet Take 1 tablet (10 mg total) by mouth 2 (two) times daily.  Marland Kitchen VITAMIN  2000 U Take 2,000 Units by mouth daily.  . citalopram  40 MG tablet TAKE 1 TABLET BY MOUTH EVERY DAY  .  (LOTRISONE) crm Apply 1 application topically 2 (two) times daily.  . CRESTOR 10 MG tablet TAKE 1 TAB 3 x week MWF  . hyoscyamine (LEVBID) 0.375 MG 12 hr t Take 1 tab every 12  hours as needed  . losartan-hctz 100-25 MG per tablet TAKE ONE TAB DAILY  . Magnesium 400 MG CAPS Take by mouth 2 (two) times daily.   . metFORMIN -XR 500 MG 24 hr tab TAKE 4 TABLETS BY MOUTH DAILY AS DIRECTED  . predniSONE  10 MG tab 1 po TID x 3 days, 1 PO BID x 3 days, 1 po QD x 5 days  . PROBIOTIC DAILY  Take by mouth daily.   No Known Allergies  Past Medical  History  Diagnosis Date  . Hyperlipemia   . Fatigue   . Chest pain   . GERD (gastroesophageal reflux disease)   . Anxiety     On Xanax  . Vitamin D deficiency   . OSA (obstructive sleep apnea)     Restated CPAP  . IBS (irritable bowel syndrome)   . HTN (hypertension)   . Type II or unspecified type diabetes mellitus without mention of complication, not stated as uncontrolled    Past Surgical History  Procedure Laterality Date  . Wisdom tooth extraction     Family History  Problem Relation Age of Onset  . Stroke    . Hypertension    . Hyperlipidemia    . Diabetes     History   Social History  . Marital Status: Married    Spouse Name: N/A    Number of Children: N/A  . Years of Education: N/A   Occupational History  . Sales in alcohol sales inventory.   Social History Main Topics  . Smoking status: Former Smoker    Quit date: 09/06/2007  . Smokeless tobacco: Not on file  . Alcohol Use: Yes     Comment: occasional  . Drug Use: No  . Sexual Activity: Active    ROS Constitutional: Denies fever,  chills, weight loss/gain, headaches, insomnia, fatigue, night sweats or change in appetite. Eyes: Denies redness, blurred vision, diplopia, discharge, itchy or watery eyes.  ENT: Denies discharge, congestion, post nasal drip, epistaxis, sore throat, earache, hearing loss, dental pain, Tinnitus, Vertigo, Sinus pain or snoring.  Cardio: Denies chest pain, palpitations, irregular heartbeat, syncope, dyspnea, diaphoresis, orthopnea, PND, claudication or edema Respiratory: denies cough, dyspnea, DOE, pleurisy, hoarseness, laryngitis or wheezing.  Gastrointestinal: Denies dysphagia, heartburn, reflux, water brash, pain, cramps, nausea, vomiting, bloating, diarrhea, constipation, hematemesis, melena, hematochezia, jaundice or hemorrhoids Genitourinary: Denies dysuria, frequency, urgency, nocturia, hesitancy, discharge, hematuria or flank pain Musculoskeletal: Denies arthralgia,  myalgia, stiffness, Jt. Swelling, pain, limp or strain/sprain. Denies Falls. Skin: Denies puritis, rash, hives, warts, acne, eczema or change in skin lesion Neuro: No weakness, tremor, incoordination, spasms, paresthesia or pain Psychiatric: Denies confusion, memory loss or sensory loss. Denies Depression. Endocrine: Denies change in weight, skin, hair change, nocturia, and paresthesia, diabetic polys, visual blurring or hyper / hypo glycemic episodes.  Heme/Lymph: No excessive bleeding, bruising or enlarged lymph nodes.  Physical Exam  BP 110/72  Pulse 76  Temp 97.6 F   Resp 16  Ht 6' 0.5"   Wt 239 lb 3.2 oz  BMI 31.98   General Appearance: Well nourished, in no apparent distress. Eyes: PERRLA, EOMs, conjunctiva no swelling or erythema, normal fundi and vessels. Sinuses: No frontal/maxillary tenderness ENT/Mouth: EACs patent / TMs  nl. Nares clear without erythema, swelling, mucoid exudates. Oral hygiene is good. No erythema, swelling, or exudate. Tongue normal, non-obstructing. Tonsils not swollen or erythematous. Hearing normal.  Neck: Supple, thyroid normal. No bruits, nodes or JVD. Respiratory: Respiratory effort normal.  BS equal and clear bilateral without rales, rhonci, wheezing or stridor. Cardio: Heart sounds are normal with regular rate and rhythm and no murmurs, rubs or gallops. Peripheral pulses are normal and equal bilaterally without edema. No aortic or femoral bruits. Chest: symmetric with normal excursions and percussion.  Abdomen: Flat, soft, with bowl sounds. Nontender, no guarding, rebound, hernias, masses, or organomegaly.  Lymphatics: Non tender without lymphadenopathy.  Genitourinary: No hernias.Testes nl. DRE - prostate nl for age - smooth & firm w/o nodules. Musculoskeletal: Full ROM all peripheral extremities, joint stability, 5/5 strength, and normal gait. Skin: Warm and dry without rashes, lesions, cyanosis, clubbing or  ecchymosis.  Neuro: Cranial nerves  intact, reflexes equal bilaterally. Normal muscle tone, no cerebellar symptoms. Sensation intact.  Pysch: Awake and oriented X 3with normal affect, insight and judgment appropriate.   Assessment and Plan  1. Annual Screening Examination 2. Hypertension  3. Hyperlipidemia 4. T2_NIDDM 5. Vitamin D Deficiency 6. GERD 7. IBS 8. OSA Continue prudent diet as discussed, weight control, BP monitoring, regular exercise, and medications as discussed.  Discussed med effects and SE's. Routine screening labs and tests as requested with regular follow-up as recommended.

## 2014-01-30 NOTE — Patient Instructions (Signed)
Recommend the book "The END of DIETING" by Dr Baker Janus   and the book "The END of DIABETES " by Dr Excell Seltzer  At [email or web address] - get book & Audio CD's      Being diabetic has a  300% increased risk for heart attack, stroke, cancer, and alzheimer- type vascular dementia. It is very important that you work harder with diet by avoiding all foods that are white except chicken & fish. Avoid white rice (brown & wild rice is OK), white potatoes (sweetpotatoes in moderation is OK), White bread or wheat bread or anything made out of white flour like bagels, donuts, rolls, buns, biscuits, cakes, pastries, cookies, pizza crust, and pasta (made from white flour & egg whites) - vegetarian pasta or spinach or wheat pasta is OK. Multigrain breads like Arnold's or [employer], or multigrain sandwich thins or flatbreads.  Diet, exercise and weight loss can reverse and cure diabetes in the early stages.  Diet, exercise and weight loss is very important in the control and prevention of complications of diabetes which affects every system in your body, ie. Brain - dementia/stroke, eyes - glaucoma/blindness, heart - heart attack/heart failure, kidneys - dialysis, stomach - gastric paralysis, intestines - malabsorption, nerves - severe painful neuritis, circulation - gangrene & loss of a leg(s), and finally cancer and Alzheimers.    I recommend avoid fried & greasy foods,  sweets/candy, white rice (brown or wild rice or Quinoa is OK), white potatoes (sweet potatoes are OK) - anything made from white flour - bagels, doughnuts, rolls, buns, biscuits,white and wheat breads, pizza crust and traditional pasta made of white flour & egg white(vegetarian pasta or spinach or wheat pasta is OK).  Multi-grain bread is OK - like multi-grain flat bread or sandwich thins. Avoid alcohol in excess. Exercise is also important.    Eat all the vegetables you want - avoid meat, especially red meat and dairy - especially cheese.  Cheese  is the most concentrated form of trans-fats which is the worst thing to clog up our arteries. Veggie cheese is OK which can be found in the fresh produce section at [employer] or [employer] or [employer]  Preventive Care for Adults A healthy lifestyle and preventive care can promote health and wellness. Preventive health guidelines for men include the following key practices:  A routine yearly physical is a good way to check with your health care provider about your health and preventative screening. It is a chance to share any concerns and updates on your health and to receive a thorough exam.  Visit your dentist for a routine exam and preventative care every 6 months. Brush your teeth twice a day and floss once a day. Good oral hygiene prevents tooth decay and gum disease.  The frequency of eye exams is based on your age, health, family medical history, use of contact lenses, and other factors. Follow your health care provider's recommendations for frequency of eye exams.  Eat a healthy diet. Foods such as vegetables, fruits, whole grains, low-fat dairy products, and lean protein foods contain the nutrients you need without too many calories. Decrease your intake of foods high in solid fats, added sugars, and salt. Eat the right amount of calories for you.Get information about a proper diet from your health care provider, if necessary.  Regular physical exercise is one of the most important things you can do for your health. Most adults should get at least 150 minutes of moderate-intensity exercise (any activity that  increases your heart rate and causes you to sweat) each week. In addition, most adults need muscle-strengthening exercises on 2 or more days a week.  Maintain a healthy weight. The body mass index (BMI) is a screening tool to identify possible weight problems. It provides an estimate of body fat based on height and weight. Your health care provider can find your BMI and can help you  achieve or maintain a healthy weight.For adults 20 years and older:  A BMI below 18.5 is considered underweight.  A BMI of 18.5 to 24.9 is normal.  A BMI of 25 to 29.9 is considered overweight.  A BMI of 30 and above is considered obese.  Maintain normal blood lipids and cholesterol levels by exercising and minimizing your intake of saturated fat. Eat a balanced diet with plenty of fruit and vegetables. Blood tests for lipids and cholesterol should begin at age 20 and be repeated every 5 years. If your lipid or cholesterol levels are high, you are over 50, or you are at high risk for heart disease, you may need your cholesterol levels checked more frequently.Ongoing high lipid and cholesterol levels should be treated with medicines if diet and exercise are not working.  If you smoke, find out from your health care provider how to quit. If you do not use tobacco, do not start.  Lung cancer screening is recommended for adults aged 72-80 years who are at high risk for developing lung cancer because of a history of smoking. A yearly low-dose CT scan of the lungs is recommended for people who have at least a 30-pack-year history of smoking and are a current smoker or have quit within the past 15 years. A pack year of smoking is smoking an average of 1 pack of cigarettes a day for 1 year (for example: 1 pack a day for 30 years or 2 packs a day for 15 years). Yearly screening should continue until the smoker has stopped smoking for at least 15 years. Yearly screening should be stopped for people who develop a health problem that would prevent them from having lung cancer treatment.  If you choose to drink alcohol, do not have more than 2 drinks per day. One drink is considered to be 12 ounces (355 mL) of beer, 5 ounces (148 mL) of wine, or 1.5 ounces (44 mL) of liquor.  Avoid use of street drugs. Do not share needles with anyone. Ask for help if you need support or instructions about stopping the use of  drugs.  High blood pressure causes heart disease and increases the risk of stroke. Your blood pressure should be checked at least every 1-2 years. Ongoing high blood pressure should be treated with medicines, if weight loss and exercise are not effective.  If you are 28-64 years old, ask your health care provider if you should take aspirin to prevent heart disease.  Diabetes screening involves taking a blood sample to check your fasting blood sugar level. This should be done once every 3 years, after age 13, if you are within normal weight and without risk factors for diabetes. Testing should be considered at a younger age or be carried out more frequently if you are overweight and have at least 1 risk factor for diabetes.  Colorectal cancer can be detected and often prevented. Most routine colorectal cancer screening begins at the age of 78 and continues through age 56. However, your health care provider may recommend screening at an earlier age if you have risk  factors for colon cancer. On a yearly basis, your health care provider may provide home test kits to check for hidden blood in the stool. Use of a small camera at the end of a tube to directly examine the colon (sigmoidoscopy or colonoscopy) can detect the earliest forms of colorectal cancer. Talk to your health care provider about this at age 48, when routine screening begins. Direct exam of the colon should be repeated every 5-10 years through age 60, unless early forms of precancerous polyps or small growths are found.  People who are at an increased risk for hepatitis B should be screened for this virus. You are considered at high risk for hepatitis B if:  You were born in a country where hepatitis B occurs often. Talk with your health care provider about which countries are considered high risk.  Your parents were born in a high-risk country and you have not received a shot to protect against hepatitis B (hepatitis B vaccine).  You have  HIV or AIDS.  You use needles to inject street drugs.  You live with, or have sex with, someone who has hepatitis B.  You are a man who has sex with other men (MSM).  You get hemodialysis treatment.  You take certain medicines for conditions such as cancer, organ transplantation, and autoimmune conditions.  Hepatitis C blood testing is recommended for all people born from [birth year] through 1965 and any individual with known risks for hepatitis C.  Practice safe sex. Use condoms and avoid high-risk sexual practices to reduce the spread of sexually transmitted infections (STIs). STIs include gonorrhea, chlamydia, syphilis, trichomonas, herpes, HPV, and human immunodeficiency virus (HIV). Herpes, HIV, and HPV are viral illnesses that have no cure. They can result in disability, cancer, and death.  If you are at risk of being infected with HIV, it is recommended that you take a prescription medicine daily to prevent HIV infection. This is called preexposure prophylaxis (PrEP). You are considered at risk if:  You are a man who has sex with other men (MSM) and have other risk factors.  You are a heterosexual man, are sexually active, and are at increased risk for HIV infection.  You take drugs by injection.  You are sexually active with a partner who has HIV.  Talk with your health care provider about whether you are at high risk of being infected with HIV. If you choose to begin PrEP, you should first be tested for HIV. You should then be tested every 3 months for as long as you are taking PrEP.  A one-time screening for abdominal aortic aneurysm (AAA) and surgical repair of large AAAs by ultrasound are recommended for men ages 51 to 11 years who are current or former smokers.  Healthy men should no longer receive prostate-specific antigen (PSA) blood tests as part of routine cancer screening. Talk with your health care provider about prostate cancer screening.  Testicular cancer screening is  not recommended for adult males who have no symptoms. Screening includes self-exam, a health care provider exam, and other screening tests. Consult with your health care provider about any symptoms you have or any concerns you have about testicular cancer.  Use sunscreen. Apply sunscreen liberally and repeatedly throughout the day. You should seek shade when your shadow is shorter than you. Protect yourself by wearing long sleeves, pants, a wide-brimmed hat, and sunglasses year round, whenever you are outdoors.  Once a month, do a whole-body skin exam, using a mirror to look  at the skin on your back. Tell your health care provider about new moles, moles that have irregular borders, moles that are larger than a pencil eraser, or moles that have changed in shape or color.  Stay current with required vaccines (immunizations).  Influenza vaccine. All adults should be immunized every year.  Tetanus, diphtheria, and acellular pertussis (Td, Tdap) vaccine. An adult who has not previously received Tdap or who does not know his vaccine status should receive 1 dose of Tdap. This initial dose should be followed by tetanus and diphtheria toxoids (Td) booster doses every 10 years. Adults with an unknown or incomplete history of completing a 3-dose immunization series with Td-containing vaccines should begin or complete a primary immunization series including a Tdap dose. Adults should receive a Td booster every 10 years.  Varicella vaccine. An adult without evidence of immunity to varicella should receive 2 doses or a second dose if he has previously received 1 dose.  Human papillomavirus (HPV) vaccine. Males aged 29-21 years who have not received the vaccine previously should receive the 3-dose series. Males aged 22-26 years may be immunized. Immunization is recommended through the age of 26 years for any male who has sex with males and did not get any or all doses earlier. Immunization is recommended for any  person with an immunocompromised condition through the age of 29 years if he did not get any or all doses earlier. During the 3-dose series, the second dose should be obtained 4-8 weeks after the first dose. The third dose should be obtained 24 weeks after the first dose and 16 weeks after the second dose.  Zoster vaccine. One dose is recommended for adults aged 38 years or older unless certain conditions are present.  Measles, mumps, and rubella (MMR) vaccine. Adults born before [birth year] generally are considered immune to measles and mumps. Adults born in [birth year] or later should have 1 or more doses of MMR vaccine unless there is a contraindication to the vaccine or there is laboratory evidence of immunity to each of the three diseases. A routine second dose of MMR vaccine should be obtained at least 28 days after the first dose for students attending postsecondary schools, health care workers, or international travelers. People who received inactivated measles vaccine or an unknown type of measles vaccine during 1963-1967 should receive 2 doses of MMR vaccine. People who received inactivated mumps vaccine or an unknown type of mumps vaccine before 1979 and are at high risk for mumps infection should consider immunization with 2 doses of MMR vaccine. Unvaccinated health care workers born before [birth year] who lack laboratory evidence of measles, mumps, or rubella immunity or laboratory confirmation of disease should consider measles and mumps immunization with 2 doses of MMR vaccine or rubella immunization with 1 dose of MMR vaccine.  Pneumococcal 13-valent conjugate (PCV13) vaccine. When indicated, a person who is uncertain of his immunization history and has no record of immunization should receive the PCV13 vaccine. An adult aged 68 years or older who has certain medical conditions and has not been previously immunized should receive 1 dose of PCV13 vaccine. This PCV13 should be followed with a dose of pneumococcal  polysaccharide (PPSV23) vaccine. The PPSV23 vaccine dose should be obtained at least 8 weeks after the dose of PCV13 vaccine. An adult aged 95 years or older who has certain medical conditions and previously received 1 or more doses of PPSV23 vaccine should receive 1 dose of PCV13. The PCV13 vaccine dose should be obtained 1  or more years after the last PPSV23 vaccine dose.  Pneumococcal polysaccharide (PPSV23) vaccine. When PCV13 is also indicated, PCV13 should be obtained first. All adults aged 65 years and older should be immunized. An adult younger than age 65 years who has certain medical conditions should be immunized. Any person who resides in a nursing home or long-term care facility should be immunized. An adult smoker should be immunized. People with an immunocompromised condition and certain other conditions should receive both PCV13 and PPSV23 vaccines. People with human immunodeficiency virus (HIV) infection should be immunized as soon as possible after diagnosis. Immunization during chemotherapy or radiation therapy should be avoided. Routine use of PPSV23 vaccine is not recommended for American Indians, [place] Natives, or people younger than 65 years unless there are medical conditions that require PPSV23 vaccine. When indicated, people who have unknown immunization and have no record of immunization should receive PPSV23 vaccine. One-time revaccination 5 years after the first dose of PPSV23 is recommended for people aged 19-64 years who have chronic kidney failure, nephrotic syndrome, asplenia, or immunocompromised conditions. People who received 1-2 doses of PPSV23 before age 65 years should receive another dose of PPSV23 vaccine at age 65 years or later if at least 5 years have passed since the previous dose. Doses of PPSV23 are not needed for people immunized with PPSV23 at or after age 65 years.  Meningococcal vaccine. Adults with asplenia or persistent complement component deficiencies  should receive 2 doses of quadrivalent meningococcal conjugate (MenACWY-D) vaccine. The doses should be obtained at least 2 months apart. Microbiologists working with certain meningococcal bacteria, military recruits, people at risk during an outbreak, and people who travel to or live in countries with a high rate of meningitis should be immunized. A first-year college student up through age 21 years who is living in a residence hall should receive a dose if he did not receive a dose on or after his 16th birthday. Adults who have certain high-risk conditions should receive one or more doses of vaccine.  Hepatitis A vaccine. Adults who wish to be protected from this disease, have certain high-risk conditions, work with hepatitis A-infected animals, work in hepatitis A research labs, or travel to or work in countries with a high rate of hepatitis A should be immunized. Adults who were previously unvaccinated and who anticipate close contact with an international adoptee during the first 60 days after arrival in the [place] from a country with a high rate of hepatitis A should be immunized.  Hepatitis B vaccine. Adults should be immunized if they wish to be protected from this disease, have certain high-risk conditions, may be exposed to blood or other infectious body fluids, are household contacts or sex partners of hepatitis B positive people, are clients or workers in certain care facilities, or travel to or work in countries with a high rate of hepatitis B.  Haemophilus influenzae type b (Hib) vaccine. A previously unvaccinated person with asplenia or sickle cell disease or having a scheduled splenectomy should receive 1 dose of Hib vaccine. Regardless of previous immunization, a recipient of a hematopoietic stem cell transplant should receive a 3-dose series 6-12 months after his successful transplant. Hib vaccine is not recommended for adults with HIV infection. Preventive Service / Frequency Ages  40 to 64  Blood pressure check.** / Every 1 to 2 years.  Lipid and cholesterol check.** / Every 5 years beginning at age 20.  Lung cancer screening. / Every year if you are aged 55-80   55-80 years and have a 30-pack-year history of smoking and currently smoke or have quit within the past 15 years. Yearly screening is stopped once you have quit smoking for at least 15 years or develop a health problem that would prevent you from having lung cancer treatment.  Fecal occult blood test (FOBT) of stool. / Every year beginning at age 50 and continuing until age 13. You may not have to do this test if you get a colonoscopy every 10 years.  Flexible sigmoidoscopy** or colonoscopy.** / Every 5 years for a flexible sigmoidoscopy or every 10 years for a colonoscopy beginning at age 62 and continuing until age 83.  Hepatitis C blood test.** / For all people born from [birth year] through 1965 and any individual with known risks for hepatitis C.  Skin self-exam. / Monthly.  Influenza vaccine. / Every year.  Tetanus, diphtheria, and acellular pertussis (Tdap/Td) vaccine.** / Consult your health care provider. 1 dose of Td every 10 years.  Varicella vaccine.** / Consult your health care provider.  Zoster vaccine.** / 1 dose for adults aged 24 years or older.  Measles, mumps, rubella (MMR) vaccine.** / You need at least 1 dose of MMR if you were born in 1957 or later. You may also need a second dose.  Pneumococcal 13-valent conjugate (PCV13) vaccine.** / Consult your health care provider.  Pneumococcal polysaccharide (PPSV23) vaccine.** / 1 to 2 doses if you smoke cigarettes or if you have certain conditions.  Meningococcal vaccine.** / Consult your health care provider.  Hepatitis A vaccine.** / Consult your health care provider.  Hepatitis B vaccine.** / Consult your health care provider.  Haemophilus influenzae type b (Hib) vaccine.** / Consult your health care provider.

## 2014-01-31 LAB — BASIC METABOLIC PANEL WITH GFR
BUN: 15 mg/dL (ref 6–23)
CO2: 29 meq/L (ref 19–32)
Calcium: 10 mg/dL (ref 8.4–10.5)
Chloride: 101 mEq/L (ref 96–112)
Creat: 0.86 mg/dL (ref 0.50–1.35)
GFR, Est Non African American: >89 mL/min
Glucose, Bld: 142 mg/dL — ABNORMAL HIGH (ref 70–99)
POTASSIUM: 4.1 meq/L (ref 3.5–5.3)
SODIUM: 139 meq/L (ref 135–145)

## 2014-01-31 LAB — PSA: PSA: 1.24 ng/mL

## 2014-01-31 LAB — URINALYSIS, MICROSCOPIC ONLY
Bacteria, UA: NONE SEEN
Casts: NONE SEEN
Crystals: NONE SEEN
Squamous Epithelial / LPF: NONE SEEN

## 2014-01-31 LAB — MICROALBUMIN / CREATININE URINE RATIO
Creatinine, Urine: 228.1 mg/dL
Microalb Creat Ratio: 4.9 mg/g (ref 0.0–30.0)
Microalb, Ur: 1.12 mg/dL (ref 0.00–1.89)

## 2014-01-31 LAB — HEPATIC FUNCTION PANEL
ALT: 18 U/L (ref 0–53)
AST: 20 U/L (ref 0–37)
Albumin: 4.2 g/dL (ref 3.5–5.2)
Alkaline Phosphatase: 55 U/L (ref 39–117)
BILIRUBIN DIRECT: 0.1 mg/dL (ref 0.0–0.3)
BILIRUBIN INDIRECT: 0.4 mg/dL (ref 0.2–1.2)
Total Bilirubin: 0.5 mg/dL (ref 0.2–1.2)
Total Protein: 7.3 g/dL (ref 6.0–8.3)

## 2014-01-31 LAB — HIV ANTIBODY (ROUTINE TESTING W REFLEX): HIV 1&2 Ab, 4th Generation: NONREACTIVE

## 2014-01-31 LAB — LIPID PANEL
CHOLESTEROL: 157 mg/dL (ref 0–200)
HDL: 41 mg/dL (ref >39)
LDL Cholesterol: 100 mg/dL — ABNORMAL HIGH (ref 0–99)
Total CHOL/HDL Ratio: 3.8 Ratio
Triglycerides: 80 mg/dL (ref <150)
VLDL: 16 mg/dL (ref 0–40)

## 2014-01-31 LAB — TSH: TSH: 1.419 u[IU]/mL (ref 0.350–4.500)

## 2014-01-31 LAB — HEPATITIS C ANTIBODY: HCV Ab: NEGATIVE

## 2014-01-31 LAB — HEPATITIS B CORE ANTIBODY, TOTAL: Hep B Core Total Ab: NONREACTIVE

## 2014-01-31 LAB — MAGNESIUM: Magnesium: 1.6 mg/dL (ref 1.5–2.5)

## 2014-01-31 LAB — TESTOSTERONE: TESTOSTERONE: 209 ng/dL — AB (ref 300–890)

## 2014-01-31 LAB — VITAMIN D 25 HYDROXY (VIT D DEFICIENCY, FRACTURES): Vit D, 25-Hydroxy: 53 ng/mL (ref 30–89)

## 2014-01-31 LAB — HEPATITIS A ANTIBODY, TOTAL: HEP A TOTAL AB: NONREACTIVE

## 2014-01-31 LAB — INSULIN, FASTING: Insulin fasting, serum: 76.3 u[IU]/mL — ABNORMAL HIGH (ref 2.0–19.6)

## 2014-01-31 LAB — HEPATITIS B SURFACE ANTIBODY,QUALITATIVE: Hep B S Ab: NEGATIVE

## 2014-01-31 LAB — SYPHILIS: RPR W/REFLEX TO RPR TITER AND TREPONEMAL ANTIBODIES, TRADITIONAL SCREENING AND DIAGNOSIS ALGORITHM

## 2014-01-31 LAB — VITAMIN B12: Vitamin B-12: 416 pg/mL (ref 211–911)

## 2014-02-02 ENCOUNTER — Other Ambulatory Visit: Payer: Self-pay | Admitting: *Deleted

## 2014-02-02 LAB — HEPATITIS B E ANTIBODY: Hepatitis Be Antibody: NONREACTIVE

## 2014-02-02 LAB — TB SKIN TEST
Induration: 0 mm
TB Skin Test: NEGATIVE

## 2014-02-02 MED ORDER — TESTOSTERONE CYPIONATE 200 MG/ML IM SOLN: 300.0000 mg | INTRAMUSCULAR | Status: DC

## 2014-02-06 ENCOUNTER — Encounter: Payer: Self-pay | Admitting: Gastroenterology

## 2014-03-04 ENCOUNTER — Other Ambulatory Visit: Payer: Self-pay | Admitting: Internal Medicine

## 2014-03-16 ENCOUNTER — Other Ambulatory Visit: Payer: Self-pay | Admitting: *Deleted

## 2014-03-16 MED ORDER — HYOSCYAMINE SULFATE ER 0.375 MG PO TB12: ORAL_TABLET | ORAL | Status: DC

## 2014-05-04 ENCOUNTER — Other Ambulatory Visit: Payer: Self-pay

## 2014-05-04 MED ORDER — LOSARTAN POTASSIUM-HCTZ 100-25 MG PO TABS: 1.0000 | ORAL_TABLET | Freq: Every day | ORAL | Status: DC

## 2014-05-04 MED ORDER — ATENOLOL 100 MG PO TABS: 100.0000 mg | ORAL_TABLET | Freq: Every day | ORAL | Status: DC

## 2014-05-06 ENCOUNTER — Encounter: Payer: Self-pay | Admitting: Internal Medicine

## 2014-06-29 ENCOUNTER — Other Ambulatory Visit: Payer: Self-pay | Admitting: *Deleted

## 2014-06-29 MED ORDER — METFORMIN HCL ER 500 MG PO TB24: ORAL_TABLET | ORAL | Status: DC

## 2014-06-30 ENCOUNTER — Other Ambulatory Visit: Payer: Self-pay | Admitting: *Deleted

## 2014-06-30 MED ORDER — ROSUVASTATIN CALCIUM 10 MG PO TABS: ORAL_TABLET | ORAL | Status: DC

## 2014-07-01 ENCOUNTER — Other Ambulatory Visit: Payer: Self-pay | Admitting: *Deleted

## 2014-07-01 MED ORDER — ROSUVASTATIN CALCIUM 10 MG PO TABS: ORAL_TABLET | ORAL | Status: DC

## 2014-07-01 MED ORDER — METFORMIN HCL ER 500 MG PO TB24: ORAL_TABLET | ORAL | Status: DC

## 2014-07-05 ENCOUNTER — Encounter (HOSPITAL_COMMUNITY): Payer: Self-pay | Admitting: *Deleted

## 2014-07-05 ENCOUNTER — Emergency Department (INDEPENDENT_AMBULATORY_CARE_PROVIDER_SITE_OTHER)
Admission: EM | Admit: 2014-07-05 | Discharge: 2014-07-05 | Disposition: A | Discharge status: Home / Self Care | Payer: 59 | Source: Home / Self Care | Attending: Family Medicine | Admitting: Family Medicine

## 2014-07-05 DIAGNOSIS — S46912A Strain of unspecified muscle, fascia and tendon at shoulder and upper arm level, left arm, initial encounter: Secondary | ICD-10-CM

## 2014-07-05 MED ORDER — METAXALONE 800 MG PO TABS: 800.0000 mg | ORAL_TABLET | Freq: Three times a day (TID) | ORAL | Status: DC

## 2014-07-05 NOTE — ED Notes (Signed)
Pt  Reports  Neck     And  l  Shoulder    Pain  With  Pain  Which  Radiates  Across  The    Shoulder    He    denys  Any  specefic injury  But  Pain is  Worse  On  Certain  Movements                    \\

## 2014-07-05 NOTE — Discharge Instructions (Signed)
Use heat, stretch and medicine as needed. See your doctor as needed.

## 2014-07-05 NOTE — ED Provider Notes (Signed)
CSN: [id number]     Arrival date & time 07/05/14  1131 History   None    Chief Complaint  Patient presents with  . Neck Pain   (Consider location/radiation/quality/duration/timing/severity/associated sxs/prior Treatment) Patient is a 64 y.o. male presenting with neck pain. The history is provided by the patient.  Neck Pain Pain location:  L side Quality:  Shooting Pain radiates to:  L scapula and L shoulder Pain severity:  Mild Onset quality:  Gradual Duration:  1 week Progression:  Unchanged Chronicity:  New Context: not fall, not lifting a heavy object and not MVA   Exacerbated by: ice pack. Ineffective treatments:  None tried   Past Medical History  Diagnosis Date  . Hyperlipemia   . Fatigue   . Chest pain   . GERD (gastroesophageal reflux disease)   . Anxiety     On Xanax  . Vitamin D deficiency   . OSA (obstructive sleep apnea)     Restated CPAP  . IBS (irritable bowel syndrome)   . HTN (hypertension)   . Type II or unspecified type diabetes mellitus without mention of complication, not stated as uncontrolled    Past Surgical History  Procedure Laterality Date  . Wisdom tooth extraction     Family History  Problem Relation Age of Onset  . Stroke    . Hypertension    . Hyperlipidemia    . Diabetes     History  Substance Use Topics  . Smoking status: Former Smoker    Quit date: 09/06/2007  . Smokeless tobacco: Not on file  . Alcohol Use: Yes     Comment: occasional    Review of Systems  Constitutional: Negative.   Musculoskeletal: Positive for myalgias, back pain and neck pain. Negative for gait problem.    Allergies  Review of patient's allergies indicates no known allergies.  Home Medications   Prior to Admission medications   Medication Sig Start Date End Date Taking? Authorizing Provider  ALPRAZolam Prudy Feeler) 1 MG tablet Take 1 mg by mouth at bedtime as needed.     Historical Provider, MD  aspirin 81 MG tablet Take 81 mg by mouth daily.     Historical Provider, MD  atenolol (TENORMIN) 100 MG tablet Take 1 tablet (100 mg total) by mouth daily. 05/04/14   Lucky Cowboy, MD  Cholecalciferol (VITAMIN D3) 2000 UNITS capsule Take 2,000 Units by mouth daily.    Historical Provider, MD  citalopram (CELEXA) 40 MG tablet TAKE 1 TABLET BY MOUTH EVERY DAY 01/20/14   Quentin Mulling, PA-C  hyoscyamine (LEVBID) 0.375 MG 12 hr tablet Take 1 -2 tabs Twice Daily prn 03/16/14   Lucky Cowboy, MD  losartan-hydrochlorothiazide (HYZAAR) 100-25 MG per tablet Take 1 tablet by mouth daily. 05/04/14   Lucky Cowboy, MD  Magnesium 400 MG CAPS Take by mouth 2 (two) times daily.     Historical Provider, MD  metaxalone (SKELAXIN) 800 MG tablet Take 1 tablet (800 mg total) by mouth 3 (three) times daily. As muscle relaxer. 07/05/14   Linna Hoff, MD  metFORMIN (GLUCOPHAGE-XR) 500 MG 24 hr tablet TAKE 4 TABLETS BY MOUTH DAILY AS DIRECTED 07/01/14   Lucky Cowboy, MD  Probiotic Product (PROBIOTIC DAILY PO) Take by mouth daily.    Historical Provider, MD  rosuvastatin (CRESTOR) 10 MG tablet TAKE 1 TABLET BY MOUTH EVERY DAY ON MONDAY [facility] AND FRIDAY 07/01/14   Lucky Cowboy, MD  testosterone cypionate (DEPOTESTOTERONE CYPIONATE) 200 MG/ML injection Inject 1.5 mLs (300 mg total)  into the muscle every 21 ( twenty-one) days. 02/02/14   Lucky CowboyWilliam McKeown, MD   BP 122/74 mmHg  Pulse 86  Temp(Src) 98.5 F (36.9 C) (Oral)  Resp 18  SpO2 97% Physical Exam  Constitutional: He is oriented to person, place, and time. He appears well-developed and well-nourished.  Neck: Normal range of motion. Neck supple.  Musculoskeletal: He exhibits tenderness.       Arms: Neurological: He is alert and oriented to person, place, and time.  Skin: Skin is warm and dry.  Nursing note and vitals reviewed.   ED Course  Procedures (including critical care time) Labs Review Labs Reviewed - No data to display  Imaging Review No results found.   MDM   1. Muscle strain of  scapular region, left, initial encounter        Linna HoffJames D Megahn Killings, MD 07/05/14 1237

## 2014-08-15 ENCOUNTER — Other Ambulatory Visit: Payer: Self-pay | Admitting: Physician Assistant

## 2014-08-17 ENCOUNTER — Other Ambulatory Visit: Payer: Self-pay | Admitting: *Deleted

## 2014-08-17 MED ORDER — HYOSCYAMINE SULFATE ER 0.375 MG PO TB12: ORAL_TABLET | ORAL | Status: DC

## 2014-08-17 MED ORDER — CITALOPRAM HYDROBROMIDE 40 MG PO TABS: ORAL_TABLET | ORAL | Status: DC

## 2014-08-20 ENCOUNTER — Other Ambulatory Visit: Payer: Self-pay | Admitting: *Deleted

## 2014-08-20 MED ORDER — CITALOPRAM HYDROBROMIDE 40 MG PO TABS: ORAL_TABLET | ORAL | Status: DC

## 2014-09-10 ENCOUNTER — Encounter: Payer: Self-pay | Admitting: Internal Medicine

## 2014-09-10 ENCOUNTER — Ambulatory Visit (INDEPENDENT_AMBULATORY_CARE_PROVIDER_SITE_OTHER): Payer: 59 | Admitting: Internal Medicine

## 2014-09-10 VITALS — BP 106/74 | HR 64 | Temp 97.6°F | Resp 16 | Ht 72.5 in | Wt 227.2 lb

## 2014-09-10 DIAGNOSIS — E559 Vitamin D deficiency, unspecified: Secondary | ICD-10-CM

## 2014-09-10 DIAGNOSIS — Z79899 Other long term (current) drug therapy: Secondary | ICD-10-CM

## 2014-09-10 DIAGNOSIS — E785 Hyperlipidemia, unspecified: Secondary | ICD-10-CM

## 2014-09-10 DIAGNOSIS — G4733 Obstructive sleep apnea (adult) (pediatric): Secondary | ICD-10-CM

## 2014-09-10 DIAGNOSIS — E119 Type 2 diabetes mellitus without complications: Secondary | ICD-10-CM

## 2014-09-10 DIAGNOSIS — I10 Essential (primary) hypertension: Secondary | ICD-10-CM

## 2014-09-10 LAB — HEMOGLOBIN A1C
Hgb A1c MFr Bld: 6.4 % — ABNORMAL HIGH (ref <5.7)
Mean Plasma Glucose: 137 mg/dL — ABNORMAL HIGH (ref <117)

## 2014-09-10 LAB — BASIC METABOLIC PANEL WITH GFR
BUN: 17 mg/dL (ref 6–23)
CO2: 32 meq/L (ref 19–32)
CREATININE: 0.83 mg/dL (ref 0.50–1.35)
Calcium: 9.9 mg/dL (ref 8.4–10.5)
Chloride: 102 mEq/L (ref 96–112)
GFR, Est African American: >89 mL/min
GFR, Est Non African American: >89 mL/min
Glucose, Bld: 113 mg/dL — ABNORMAL HIGH (ref 70–99)
Potassium: 4 mEq/L (ref 3.5–5.3)
Sodium: 139 mEq/L (ref 135–145)

## 2014-09-10 LAB — CBC WITH DIFFERENTIAL/PLATELET
BASOS PCT: 1 % (ref 0–1)
Basophils Absolute: 0.1 10*3/uL (ref 0.0–0.1)
Eosinophils Absolute: 0.3 10*3/uL (ref 0.0–0.7)
Eosinophils Relative: 4 % (ref 0–5)
HCT: 37.4 % — ABNORMAL LOW (ref 39.0–52.0)
Hemoglobin: 12.7 g/dL — ABNORMAL LOW (ref 13.0–17.0)
LYMPHS PCT: 50 % — AB (ref 12–46)
Lymphs Abs: 3.2 10*3/uL (ref 0.7–4.0)
MCH: 30.9 pg (ref 26.0–34.0)
MCHC: 34 g/dL (ref 30.0–36.0)
MCV: 91 fL (ref 78.0–100.0)
MONOS PCT: 4 % (ref 3–12)
MPV: 10.1 fL (ref 8.6–12.4)
Monocytes Absolute: 0.3 10*3/uL (ref 0.1–1.0)
NEUTROS ABS: 2.6 10*3/uL (ref 1.7–7.7)
NEUTROS PCT: 41 % — AB (ref 43–77)
Platelets: 281 10*3/uL (ref 150–400)
RBC: 4.11 MIL/uL — ABNORMAL LOW (ref 4.22–5.81)
RDW: 13.5 % (ref 11.5–15.5)
WBC: 6.4 10*3/uL (ref 4.0–10.5)

## 2014-09-10 LAB — HEPATIC FUNCTION PANEL
ALK PHOS: 57 U/L (ref 39–117)
ALT: 18 U/L (ref 0–53)
AST: 23 U/L (ref 0–37)
Albumin: 4.2 g/dL (ref 3.5–5.2)
BILIRUBIN INDIRECT: 0.5 mg/dL (ref 0.2–1.2)
Bilirubin, Direct: 0.1 mg/dL (ref 0.0–0.3)
Total Bilirubin: 0.6 mg/dL (ref 0.2–1.2)
Total Protein: 7 g/dL (ref 6.0–8.3)

## 2014-09-10 LAB — LIPID PANEL
CHOL/HDL RATIO: 3.9 ratio
CHOLESTEROL: 164 mg/dL (ref 0–200)
HDL: 42 mg/dL (ref >=40)
LDL Cholesterol: 109 mg/dL — ABNORMAL HIGH (ref 0–99)
TRIGLYCERIDES: 65 mg/dL (ref <150)
VLDL: 13 mg/dL (ref 0–40)

## 2014-09-10 LAB — MAGNESIUM: Magnesium: 2 mg/dL (ref 1.5–2.5)

## 2014-09-10 LAB — TSH: TSH: 1.85 u[IU]/mL (ref 0.350–4.500)

## 2014-09-10 NOTE — Progress Notes (Signed)
Patient ID: Wayne [name], male   DOB: 12/04/1950, 64 y.o.   MRN: [id number]   This very nice 64 y.o. MBM presents for overdue 3 month follow up with Hypertension, Hyperlipidemia, T2_DM and Vitamin D Deficiency.    Patient is treated for HTN & BP has been controlled at home. Today's BP: 106/74 mmHg. Patient has had no complaints of any cardiac type chest pain, palpitations, dyspnea/orthopnea/PND, dizziness, claudication, or dependent edema.   Hyperlipidemia is controlled with diet & meds. Patient denies myalgias or other med SE's. Last Lipids were near goal - Total Chol 164; HDL 42; with sl elevated LDL  109; Triglycerides 65 on 09/10/2014.   Also, the patient has history of T2_NIDDM w/CKD2 (GFR 79 ml/min) since 2007 and has had no symptoms of reactive hypoglycemia, diabetic polys, paresthesias or visual blurring.  Last A1c was not at goal -  7.3% on 01/30/2014.    Further, the patient also has history of Vitamin D Deficiency of 22 in 2008 and supplements vitamin D without any suspected side-effects. Last vitamin D was 53 on  01/30/2014.     Medication Sig  . ALPRAZolam (XANAX) 1 MG tablet Take 1 mg by mouth at bedtime as needed.   Marland Kitchen. aspirin 81 MG tablet Take 81 mg by mouth daily.  Marland Kitchen. atenolol (TENORMIN) 100 MG tablet Take 1 tablet (100 mg total) by mouth daily.  . Cholecalciferol (VITAMIN D3) 2000 UNITS capsule Take 2,000 Units by mouth daily. Takes 6000 units daily  . citalopram (CELEXA) 40 MG tablet TAKE 1 TABLET BY MOUTH EVERY DAY  . hyoscyamine (LEVBID) 0.375 MG 12 hr tablet Take 1 -2 tabs Twice Daily prn  . losartan-hydrochlorothiazide (HYZAAR) 100-25 MG per tablet Take 1 tablet by mouth daily.  . Magnesium 400 MG CAPS Take by mouth 2 (two) times daily.   . metFORMIN (GLUCOPHAGE-XR) 500 MG 24 hr tablet TAKE 4 TABLETS BY MOUTH DAILY AS DIRECTED  . Probiotic Product (PROBIOTIC DAILY PO) Take by mouth daily.  . rosuvastatin (CRESTOR) 10 MG tablet TAKE 1 TABLET BY MOUTH EVERY DAY ON MONDAY  WEDNESDAY AND FRIDAY  . metaxalone (SKELAXIN) 800 MG tablet Take 1 tablet (800 mg total) by mouth 3 (three) times daily. As muscle relaxer.  . testosterone cypionate (DEPOTESTOTERONE CYPIONATE) 200 MG/ML injection Inject 1.5 mLs (300 mg total) into the muscle every 21 ( twenty-one) days.   No Known Allergies  PMHx:   Past Medical History  Diagnosis Date  . Hyperlipemia   . Fatigue   . Chest pain   . GERD (gastroesophageal reflux disease)   . Anxiety     On Xanax  . Vitamin D deficiency   . OSA (obstructive sleep apnea)     Restated CPAP  . IBS (irritable bowel syndrome)   . HTN (hypertension)   . Type II or unspecified type diabetes mellitus without mention of complication, not stated as uncontrolled    Immunization History  Administered Date(s) Administered  . PPD Test 01/30/2014  . Pneumococcal Polysaccharide-23 05/21/2008  . Td 10/30/2003  . Tdap 01/30/2014   Past Surgical History  Procedure Laterality Date  . Wisdom tooth extraction     FHx:    Reviewed / unchanged  SHx:    Reviewed / unchanged  Systems Review:  Constitutional: Denies fever, chills, wt changes, headaches, insomnia, fatigue, night sweats, change in appetite. Eyes: Denies redness, blurred vision, diplopia, discharge, itchy, watery eyes.  ENT: Denies discharge, congestion, post nasal drip, epistaxis, sore throat, earache, hearing loss,  dental pain, tinnitus, vertigo, sinus pain, snoring.  CV: Denies chest pain, palpitations, irregular heartbeat, syncope, dyspnea, diaphoresis, orthopnea, PND, claudication or edema. Respiratory: denies cough, dyspnea, DOE, pleurisy, hoarseness, laryngitis, wheezing.  Gastrointestinal: Denies dysphagia, odynophagia, heartburn, reflux, water brash, abdominal pain or cramps, nausea, vomiting, bloating, diarrhea, constipation, hematemesis, melena, hematochezia  or hemorrhoids. Genitourinary: Denies dysuria, frequency, urgency, nocturia, hesitancy, discharge, hematuria or flank  pain. Musculoskeletal: Denies arthralgias, myalgias, stiffness, jt. swelling, pain, limping or strain/sprain.  Skin: Denies pruritus, rash, hives, warts, acne, eczema or change in skin lesion(s). Neuro: No weakness, tremor, incoordination, spasms, paresthesia or pain. Psychiatric: Denies confusion, memory loss or sensory loss. Endo: Denies change in weight, skin or hair change.  Heme/Lymph: No excessive bleeding, bruising or enlarged lymph nodes.  Physical Exam  BP 106/74   Pulse 64  Temp 97.6 F   Resp 16  Ht 6' 0.5"   Wt 227 lb      BMI 30.37   Appears well nourished and in no distress. Eyes: PERRLA, EOMs, conjunctiva no swelling or erythema. Sinuses: No frontal/maxillary tenderness ENT/Mouth: EAC's clear, TM's nl w/o erythema, bulging. Nares clear w/o erythema, swelling, exudates. Oropharynx clear without erythema or exudates. Oral hygiene is good. Tongue normal, non obstructing. Hearing intact.  Neck: Supple. Thyroid nl. Car 2+/2+ without bruits, nodes or JVD. Chest: Respirations nl with BS clear & equal w/o rales, rhonchi, wheezing or stridor.  Cor: Heart sounds normal w/ regular rate and rhythm without sig. murmurs, gallops, clicks, or rubs. Peripheral pulses normal and equal  without edema.  Abdomen: Soft & bowel sounds normal. Non-tender w/o guarding, rebound, hernias, masses, or organomegaly.  Lymphatics: Unremarkable.  Musculoskeletal: Full ROM all peripheral extremities, joint stability, 5/5 strength, and normal gait.  Skin: Warm, dry without exposed rashes, lesions or ecchymosis apparent.  Neuro: Cranial nerves intact, reflexes equal bilaterally. Sensory-motor testing grossly intact. Tendon reflexes grossly intact.  Pysch: Alert & oriented x 3.  Insight and judgement nl & appropriate. No ideations.  Assessment and Plan:  1. Essential hypertension  - TSH  2. Hyperlipemia  - Lipid panel  3. Diabetes mellitus type II, controlled  - Hemoglobin A1c - Insulin,  random  4. Vitamin D deficiency  - Vit D  25 hydroxy   5. OSA (obstructive sleep apnea)   6. Medication management  - CBC with Differential/Platelet - BASIC METABOLIC PANEL WITH GFR - Hepatic function panel - Magnesium   Recommended regular exercise, BP monitoring, weight control, and discussed med and SE's. Recommended labs to assess and monitor clinical status. Further disposition pending results of labs. Over 30 minutes of exam, counseling, chart review was performed

## 2014-09-10 NOTE — Patient Instructions (Signed)
++++++++++++++++++++++++++++++++++    Recommend Low dose or baby Aspirin 81 mg daily   To reduce risk of Colon Cancer 20 %, Skin Cancer 26 % , Melanoma 46% and   Pancreatic cancer 60%  +++++++++++++++++++++++++++++++++ +++++++++++++++++++++++++++++++++++++++++++++++++++++++++++  Vitamin D goal is between 70-100.   Please make sure that you are taking your Vitamin D as directed.   It is very important as a natural antiinflammatory   helping hair, skin, and nails, as well as reducing stroke and heart attack risk.   It helps your bones and helps with mood.  It also decreases numerous cancer risks so please take it as directed.   +++++++++++++++++++++++++++++++++++++++++++++++++++++++++++ Recommend the book "The END of DIETING" by Dr Joel Fuhrman   & the book "The END of DIABETES " by Dr Joel Fuhrman  At [employer] - get book & Audio CD's     Being diabetic has a  300% increased risk for heart attack, stroke, cancer, and alzheimer- type vascular dementia. It is very important that you work harder with diet by avoiding all foods that are white. Avoid white rice (brown & wild rice is OK), white potatoes (sweetpotatoes in moderation is OK), White bread or wheat bread or anything made out of white flour like bagels, donuts, rolls, buns, biscuits, cakes, pastries, cookies, pizza crust, and pasta (made from white flour & egg whites) - vegetarian pasta or spinach or wheat pasta is OK. Multigrain breads like Arnold's or [employer], or multigrain sandwich thins or flatbreads.  Diet, exercise and weight loss can reverse and cure diabetes in the early stages.  Diet, exercise and weight loss is very important in the control and prevention of complications of diabetes which affects every system in your body, ie. Brain - dementia/stroke, eyes - glaucoma/blindness, heart - heart attack/heart failure, kidneys - dialysis, stomach - gastric paralysis, intestines - malabsorption, nerves - severe painful  neuritis, circulation - gangrene & loss of a leg(s), and finally cancer and Alzheimers.    I recommend avoid fried & greasy foods,  sweets/candy, white rice (brown or wild rice or Quinoa is OK), white potatoes (sweet potatoes are OK) - anything made from white flour - bagels, doughnuts, rolls, buns, biscuits,white and wheat breads, pizza crust and traditional pasta made of white flour & egg white(vegetarian pasta or spinach or wheat pasta is OK).  Multi-grain bread is OK - like multi-grain flat bread or sandwich thins. Avoid alcohol in excess. Exercise is also important.    Eat all the vegetables you want - avoid meat, especially red meat and dairy - especially cheese.  Cheese is the most concentrated form of trans-fats which is the worst thing to clog up our arteries. Veggie cheese is OK which can be found in the fresh produce section at [employer] or [employer] or [employer]  ++++++++++++++++++++++++++++++++++++++++++++++++++++++++   

## 2014-09-11 ENCOUNTER — Other Ambulatory Visit: Payer: Self-pay | Admitting: *Deleted

## 2014-09-11 LAB — VITAMIN D 25 HYDROXY (VIT D DEFICIENCY, FRACTURES): VIT D 25 HYDROXY: 50 ng/mL (ref 30–100)

## 2014-09-11 LAB — INSULIN, RANDOM: Insulin: 55.5 u[IU]/mL — ABNORMAL HIGH (ref 2.0–19.6)

## 2014-09-11 MED ORDER — LOSARTAN POTASSIUM-HCTZ 100-25 MG PO TABS: 1.0000 | ORAL_TABLET | Freq: Every day | ORAL | Status: DC

## 2014-09-11 MED ORDER — ROSUVASTATIN CALCIUM 10 MG PO TABS: ORAL_TABLET | ORAL | Status: DC

## 2014-09-11 MED ORDER — ATENOLOL 100 MG PO TABS: 100.0000 mg | ORAL_TABLET | Freq: Every day | ORAL | Status: DC

## 2014-09-11 MED ORDER — METFORMIN HCL ER 500 MG PO TB24: ORAL_TABLET | ORAL | Status: DC

## 2014-09-14 ENCOUNTER — Telehealth: Payer: Self-pay | Admitting: *Deleted

## 2014-09-14 NOTE — Telephone Encounter (Signed)
Pt aware of lab results 

## 2014-09-21 ENCOUNTER — Other Ambulatory Visit: Payer: Self-pay

## 2014-09-21 MED ORDER — CITALOPRAM HYDROBROMIDE 40 MG PO TABS: ORAL_TABLET | ORAL | Status: DC

## 2015-02-03 ENCOUNTER — Encounter: Payer: Self-pay | Admitting: Internal Medicine

## 2015-02-24 ENCOUNTER — Other Ambulatory Visit: Payer: Self-pay | Admitting: Internal Medicine

## 2015-02-24 DIAGNOSIS — E119 Type 2 diabetes mellitus without complications: Secondary | ICD-10-CM

## 2015-02-24 DIAGNOSIS — I10 Essential (primary) hypertension: Secondary | ICD-10-CM

## 2015-02-24 MED ORDER — LOSARTAN POTASSIUM-HCTZ 100-25 MG PO TABS: 30.0000 | ORAL_TABLET | Freq: Every day | ORAL | Status: DC

## 2015-02-24 MED ORDER — METFORMIN HCL ER 500 MG PO TB24
ORAL_TABLET | ORAL | Status: DC
Start: 2015-02-24 — End: 2015-03-01

## 2015-02-24 MED ORDER — ATENOLOL 100 MG PO TABS: 100.0000 mg | ORAL_TABLET | Freq: Every day | ORAL | Status: DC

## 2015-03-01 ENCOUNTER — Ambulatory Visit: Payer: Self-pay | Admitting: Internal Medicine

## 2015-03-01 ENCOUNTER — Encounter: Payer: Self-pay | Admitting: Internal Medicine

## 2015-03-01 ENCOUNTER — Ambulatory Visit (INDEPENDENT_AMBULATORY_CARE_PROVIDER_SITE_OTHER): Payer: 59 | Admitting: Internal Medicine

## 2015-03-01 VITALS — BP 122/74 | HR 68 | Temp 98.4°F | Resp 16 | Ht 72.5 in | Wt 234.0 lb

## 2015-03-01 DIAGNOSIS — Z23 Encounter for immunization: Secondary | ICD-10-CM | POA: Diagnosis not present

## 2015-03-01 DIAGNOSIS — Z1331 Encounter for screening for depression: Secondary | ICD-10-CM

## 2015-03-01 DIAGNOSIS — Z789 Other specified health status: Secondary | ICD-10-CM | POA: Diagnosis not present

## 2015-03-01 DIAGNOSIS — Z130 Encounter for screening for diseases of the blood and blood-forming organs and certain disorders involving the immune mechanism: Secondary | ICD-10-CM

## 2015-03-01 DIAGNOSIS — Z125 Encounter for screening for malignant neoplasm of prostate: Secondary | ICD-10-CM

## 2015-03-01 DIAGNOSIS — I493 Ventricular premature depolarization: Secondary | ICD-10-CM

## 2015-03-01 DIAGNOSIS — I10 Essential (primary) hypertension: Secondary | ICD-10-CM | POA: Diagnosis not present

## 2015-03-01 DIAGNOSIS — Z9181 History of falling: Secondary | ICD-10-CM

## 2015-03-01 DIAGNOSIS — Z0000 Encounter for general adult medical examination without abnormal findings: Secondary | ICD-10-CM

## 2015-03-01 DIAGNOSIS — Z0001 Encounter for general adult medical examination with abnormal findings: Secondary | ICD-10-CM

## 2015-03-01 DIAGNOSIS — Z1212 Encounter for screening for malignant neoplasm of rectum: Secondary | ICD-10-CM

## 2015-03-01 DIAGNOSIS — Z1389 Encounter for screening for other disorder: Secondary | ICD-10-CM

## 2015-03-01 DIAGNOSIS — E785 Hyperlipidemia, unspecified: Secondary | ICD-10-CM

## 2015-03-01 DIAGNOSIS — E559 Vitamin D deficiency, unspecified: Secondary | ICD-10-CM

## 2015-03-01 DIAGNOSIS — Z136 Encounter for screening for cardiovascular disorders: Secondary | ICD-10-CM

## 2015-03-01 DIAGNOSIS — E119 Type 2 diabetes mellitus without complications: Secondary | ICD-10-CM

## 2015-03-01 DIAGNOSIS — Z79899 Other long term (current) drug therapy: Secondary | ICD-10-CM

## 2015-03-01 DIAGNOSIS — E349 Endocrine disorder, unspecified: Secondary | ICD-10-CM

## 2015-03-01 LAB — CBC WITH DIFFERENTIAL/PLATELET
BASOS ABS: 0.1 10*3/uL (ref 0.0–0.1)
Basophils Relative: 1 % (ref 0–1)
Eosinophils Absolute: 0.5 10*3/uL (ref 0.0–0.7)
Eosinophils Relative: 5 % (ref 0–5)
HEMATOCRIT: 38.1 % — AB (ref 39.0–52.0)
Hemoglobin: 13 g/dL (ref 13.0–17.0)
LYMPHS PCT: 56 % — AB (ref 12–46)
Lymphs Abs: 5 10*3/uL — ABNORMAL HIGH (ref 0.7–4.0)
MCH: 31.2 pg (ref 26.0–34.0)
MCHC: 34.1 g/dL (ref 30.0–36.0)
MCV: 91.4 fL (ref 78.0–100.0)
MPV: 10.1 fL (ref 8.6–12.4)
Monocytes Absolute: 0.5 10*3/uL (ref 0.1–1.0)
Monocytes Relative: 5 % (ref 3–12)
NEUTROS PCT: 33 % — AB (ref 43–77)
Neutro Abs: 3 10*3/uL (ref 1.7–7.7)
Platelets: 297 10*3/uL (ref 150–400)
RBC: 4.17 MIL/uL — ABNORMAL LOW (ref 4.22–5.81)
RDW: 13.6 % (ref 11.5–15.5)
WBC: 9 10*3/uL (ref 4.0–10.5)

## 2015-03-01 LAB — MAGNESIUM: MAGNESIUM: 1.7 mg/dL (ref 1.5–2.5)

## 2015-03-01 LAB — BASIC METABOLIC PANEL WITH GFR
BUN: 13 mg/dL (ref 7–25)
CALCIUM: 9.6 mg/dL (ref 8.6–10.3)
CO2: 28 mmol/L (ref 20–31)
Chloride: 103 mmol/L (ref 98–110)
Creat: 0.83 mg/dL (ref 0.70–1.25)
Glucose, Bld: 84 mg/dL (ref 65–99)
Potassium: 4.1 mmol/L (ref 3.5–5.3)
Sodium: 139 mmol/L (ref 135–146)

## 2015-03-01 LAB — HEPATIC FUNCTION PANEL
ALT: 17 U/L (ref 9–46)
AST: 18 U/L (ref 10–35)
Albumin: 4.2 g/dL (ref 3.6–5.1)
Alkaline Phosphatase: 53 U/L (ref 40–115)
BILIRUBIN DIRECT: 0.1 mg/dL (ref <=0.2)
BILIRUBIN INDIRECT: 0.3 mg/dL (ref 0.2–1.2)
BILIRUBIN TOTAL: 0.4 mg/dL (ref 0.2–1.2)
Total Protein: 7.6 g/dL (ref 6.1–8.1)

## 2015-03-01 LAB — IRON AND TIBC
%SAT: 19 % (ref 15–60)
IRON: 68 ug/dL (ref 50–180)
TIBC: 349 ug/dL (ref 250–425)
UIBC: 281 ug/dL (ref 125–400)

## 2015-03-01 LAB — LIPID PANEL
CHOL/HDL RATIO: 3.8 ratio (ref <=5.0)
Cholesterol: 129 mg/dL (ref 125–200)
HDL: 34 mg/dL — AB (ref >=40)
LDL Cholesterol: 74 mg/dL (ref <130)
Triglycerides: 107 mg/dL (ref <150)
VLDL: 21 mg/dL (ref <30)

## 2015-03-01 MED ORDER — ROSUVASTATIN CALCIUM 10 MG PO TABS: ORAL_TABLET | ORAL | Status: DC

## 2015-03-01 MED ORDER — HYOSCYAMINE SULFATE ER 0.375 MG PO TB12: ORAL_TABLET | ORAL | Status: DC

## 2015-03-01 MED ORDER — METFORMIN HCL ER 500 MG PO TB24: ORAL_TABLET | ORAL | Status: DC

## 2015-03-01 MED ORDER — ATENOLOL 100 MG PO TABS: 100.0000 mg | ORAL_TABLET | Freq: Every day | ORAL | Status: DC

## 2015-03-01 MED ORDER — LOSARTAN POTASSIUM-HCTZ 100-25 MG PO TABS: 1.0000 | ORAL_TABLET | Freq: Every day | ORAL | Status: DC

## 2015-03-01 NOTE — Patient Instructions (Signed)
[email or web address]   Preventive Care for Adults  A healthy lifestyle and preventive care can promote health and wellness. Preventive health guidelines for men include the following key practices:  A routine yearly physical is a good way to check with your health care provider about your health and preventative screening. It is a chance to share any concerns and updates on your health and to receive a thorough exam.  Visit your dentist for a routine exam and preventative care every 6 months. Brush your teeth twice a day and floss once a day. Good oral hygiene prevents tooth decay and gum disease.  The frequency of eye exams is based on your age, health, family medical history, use of contact lenses, and other factors. Follow your health care provider's recommendations for frequency of eye exams.  Eat a healthy diet. Foods such as vegetables, fruits, whole grains, low-fat dairy products, and lean protein foods contain the nutrients you need without too many calories. Decrease your intake of foods high in solid fats, added sugars, and salt. Eat the right amount of calories for you.Get information about a proper diet from your health care provider, if necessary.  Regular physical exercise is one of the most important things you can do for your health. Most adults should get at least 150 minutes of moderate-intensity exercise (any activity that increases your heart rate and causes you to sweat) each week. In addition, most adults need muscle-strengthening exercises on 2 or more days a week.  Maintain a healthy weight. The body mass index (BMI) is a screening tool to identify possible weight problems. It provides an estimate of body fat based on height and weight. Your health care provider can find your BMI and can help you achieve or maintain a healthy weight.For adults 20 years and older:  A BMI below 18.5 is considered underweight.  A BMI of 18.5 to 24.9 is normal.  A BMI of 25 to 29.9 is  considered overweight.  A BMI of 30 and above is considered obese.  Maintain normal blood lipids and cholesterol levels by exercising and minimizing your intake of saturated fat. Eat a balanced diet with plenty of fruit and vegetables. Blood tests for lipids and cholesterol should begin at age 33 and be repeated every 5 years. If your lipid or cholesterol levels are high, you are over 50, or you are at high risk for heart disease, you may need your cholesterol levels checked more frequently.Ongoing high lipid and cholesterol levels should be treated with medicines if diet and exercise are not working.  If you smoke, find out from your health care provider how to quit. If you do not use tobacco, do not start.  Lung cancer screening is recommended for adults aged 55-80 years who are at high risk for developing lung cancer because of a history of smoking. A yearly low-dose CT scan of the lungs is recommended for people who have at least a 30-pack-year history of smoking and are a current smoker or have quit within the past 15 years. A pack year of smoking is smoking an average of 1 pack of cigarettes a day for 1 year (for example: 1 pack a day for 30 years or 2 packs a day for 15 years). Yearly screening should continue until the smoker has stopped smoking for at least 15 years. Yearly screening should be stopped for people who develop a health problem that would prevent them from having lung cancer treatment.  If you choose to drink alcohol, do  not have more than 2 drinks per day. One drink is considered to be 12 ounces (355 mL) of beer, 5 ounces (148 mL) of wine, or 1.5 ounces (44 mL) of liquor.  Avoid use of street drugs. Do not share needles with anyone. Ask for help if you need support or instructions about stopping the use of drugs.  High blood pressure causes heart disease and increases the risk of stroke. Your blood pressure should be checked at least every 1-2 years. Ongoing high blood pressure  should be treated with medicines, if weight loss and exercise are not effective.  If you are 32-53 years old, ask your health care provider if you should take aspirin to prevent heart disease.  Diabetes screening involves taking a blood sample to check your fasting blood sugar level. This should be done once every 3 years, after age 19, if you are within normal weight and without risk factors for diabetes. Testing should be considered at a younger age or be carried out more frequently if you are overweight and have at least 1 risk factor for diabetes.  Colorectal cancer can be detected and often prevented. Most routine colorectal cancer screening begins at the age of 32 and continues through age 89. However, your health care provider may recommend screening at an earlier age if you have risk factors for colon cancer. On a yearly basis, your health care provider may provide home test kits to check for hidden blood in the stool. Use of a small camera at the end of a tube to directly examine the colon (sigmoidoscopy or colonoscopy) can detect the earliest forms of colorectal cancer. Talk to your health care provider about this at age 15, when routine screening begins. Direct exam of the colon should be repeated every 5-10 years through age 73, unless early forms of precancerous polyps or small growths are found.   Talk with your health care provider about prostate cancer screening.  Testicular cancer screening isrecommended for adult males. Screening includes self-exam, a health care provider exam, and other screening tests. Consult with your health care provider about any symptoms you have or any concerns you have about testicular cancer.  Use sunscreen. Apply sunscreen liberally and repeatedly throughout the day. You should seek shade when your shadow is shorter than you. Protect yourself by wearing long sleeves, pants, a wide-brimmed hat, and sunglasses year round, whenever you are outdoors.  Once a  month, do a whole-body skin exam, using a mirror to look at the skin on your back. Tell your health care provider about new moles, moles that have irregular borders, moles that are larger than a pencil eraser, or moles that have changed in shape or color.  Stay current with required vaccines (immunizations).  Influenza vaccine. All adults should be immunized every year.  Tetanus, diphtheria, and acellular pertussis (Td, Tdap) vaccine. An adult who has not previously received Tdap or who does not know his vaccine status should receive 1 dose of Tdap. This initial dose should be followed by tetanus and diphtheria toxoids (Td) booster doses every 10 years. Adults with an unknown or incomplete history of completing a 3-dose immunization series with Td-containing vaccines should begin or complete a primary immunization series including a Tdap dose. Adults should receive a Td booster every 10 years.  Varicella vaccine. An adult without evidence of immunity to varicella should receive 2 doses or a second dose if he has previously received 1 dose.  Human papillomavirus (HPV) vaccine. Males aged 13-21 years  who have not received the vaccine previously should receive the 3-dose series. Males aged 22-26 years may be immunized. Immunization is recommended through the age of 57 years for any male who has sex with males and did not get any or all doses earlier. Immunization is recommended for any person with an immunocompromised condition through the age of 26 years if he did not get any or all doses earlier. During the 3-dose series, the second dose should be obtained 4-8 weeks after the first dose. The third dose should be obtained 24 weeks after the first dose and 16 weeks after the second dose.  Zoster vaccine. One dose is recommended for adults aged 40 years or older unless certain conditions are present.    PREVNAR  - Pneumococcal 13-valent conjugate (PCV13) vaccine. When indicated, a person who is uncertain  of his immunization history and has no record of immunization should receive the PCV13 vaccine. An adult aged 78 years or older who has certain medical conditions and has not been previously immunized should receive 1 dose of PCV13 vaccine. This PCV13 should be followed with a dose of pneumococcal polysaccharide (PPSV23) vaccine. The PPSV23 vaccine dose should be obtained at least 8 weeks after the dose of PCV13 vaccine. An adult aged 65 years or older who has certain medical conditions and previously received 1 or more doses of PPSV23 vaccine should receive 1 dose of PCV13. The PCV13 vaccine dose should be obtained 1 or more years after the last PPSV23 vaccine dose.    PNEUMOVAX - Pneumococcal polysaccharide (PPSV23) vaccine. When PCV13 is also indicated, PCV13 should be obtained first. All adults aged 63 years and older should be immunized. An adult younger than age 79 years who has certain medical conditions should be immunized. Any person who resides in a nursing home or long-term care facility should be immunized. An adult smoker should be immunized. People with an immunocompromised condition and certain other conditions should receive both PCV13 and PPSV23 vaccines. People with human immunodeficiency virus (HIV) infection should be immunized as soon as possible after diagnosis. Immunization during chemotherapy or radiation therapy should be avoided. Routine use of PPSV23 vaccine is not recommended for American Indians, [address], or people younger than 65 years unless there are medical conditions that require PPSV23 vaccine. When indicated, people who have unknown immunization and have no record of immunization should receive PPSV23 vaccine. One-time revaccination 5 years after the first dose of PPSV23 is recommended for people aged 19-64 years who have chronic kidney failure, nephrotic syndrome, asplenia, or immunocompromised conditions. People who received 1-2 doses of PPSV23 before age 90 years  should receive another dose of PPSV23 vaccine at age 51 years or later if at least 5 years have passed since the previous dose. Doses of PPSV23 are not needed for people immunized with PPSV23 at or after age 3 years.    Hepatitis A vaccine. Adults who wish to be protected from this disease, have certain high-risk conditions, work with hepatitis A-infected animals, work in hepatitis A research labs, or travel to or work in countries with a high rate of hepatitis A should be immunized. Adults who were previously unvaccinated and who anticipate close contact with an international adoptee during the first 60 days after arrival in the [place] from a country with a high rate of hepatitis A should be immunized.    Hepatitis B vaccine. Adults should be immunized if they wish to be protected from this disease, have certain high-risk conditions, may  be exposed to blood or other infectious body fluids, are household contacts or sex partners of hepatitis B positive people, are clients or workers in certain care facilities, or travel to or work in countries with a high rate of hepatitis B.   Preventive Service / Frequency   Ages 2740 to 5064  Blood pressure check.  Lipid and cholesterol check  Lung cancer screening. / Every year if you are aged 55-80 years and have a 30-pack-year history of smoking and currently smoke or have quit within the past 15 years. Yearly screening is stopped once you have quit smoking for at least 15 years or develop a health problem that would prevent you from having lung cancer treatment.  Fecal occult blood test (FOBT) of stool. / Every year beginning at age 62 and continuing until age 62. You may not have to do this test if you get a colonoscopy every 10 years.  Flexible sigmoidoscopy** or colonoscopy.** / Every 5 years for a flexible sigmoidoscopy or every 10 years for a colonoscopy beginning at age 62 and continuing until age 62. Screening for abdominal aortic aneurysm  (AAA)  by ultrasound is recommended for people who have history of high blood pressure or who are current or former smokers.

## 2015-03-01 NOTE — Progress Notes (Signed)
Patient ID: Wayne [name], male   DOB: 04/02/1951, 64 y.o.   MRN: [id number]  Complete Physical  Assessment and Plan:  Discussed med's effects and SE's. Screening labs and tests as requested with regular follow-up as recommended.  HPI Patient presents for a complete physical.   His blood pressure has been controlled at home, today their BP is BP: 122/74 mmHg He does workout. He denies chest pain, shortness of breath, dizziness.  He reports that he occasionally has some high readings of blood pressure.     He is on cholesterol medication and denies myalgias. His cholesterol is at goal. The cholesterol last visit was:   Lab Results  Component Value Date   CHOL 164 09/10/2014   HDL 42 09/10/2014   LDLCALC 109* 09/10/2014   TRIG 65 09/10/2014   CHOLHDL 3.9 09/10/2014    He has been working on diet and exercise for prediabetes, he is on bASA, he is on ACE/ARB and denies foot ulcerations, hyperglycemia, hypoglycemia , increased appetite, nausea, paresthesia of the feet, polydipsia, polyuria, vomiting and weight loss. Last A1C in the office was:  Lab Results  Component Value Date   HGBA1C 6.4* 09/10/2014    Patient is on Vitamin D supplement.   Lab Results  Component Value Date   VD25OH 50 09/10/2014      Last PSA was: Lab Results  Component Value Date   PSA 1.24 01/30/2014  .  Denies BPH symptoms daytime frequency, double voiding, dysuria, hematuria, hesitancy, incontinence, intermittency, nocturia, sensation of incomplete bladder emptying, suprapubic pain, urgency or weak urinary stream.  He reports that he had some early onset of glaucoma at his last visit.  He is due to go back.  He had that done in the last 60 days.    He is due to have a colonoscopy.      Current Medications:  Current Outpatient Prescriptions on File Prior to Visit  Medication Sig Dispense Refill  . ALPRAZolam (XANAX) 1 MG tablet Take 1 mg by mouth at bedtime as needed.     Marland Kitchen. aspirin 81 MG tablet Take 81  mg by mouth daily.    Marland Kitchen. atenolol (TENORMIN) 100 MG tablet Take 1 tablet (100 mg total) by mouth daily. 30 tablet 0  . Cholecalciferol (VITAMIN D3) 2000 UNITS capsule Take 2,000 Units by mouth daily. Takes 6000 units daily    . citalopram (CELEXA) 40 MG tablet TAKE 1 TABLET BY MOUTH EVERY DAY 90 tablet 4  . losartan-hydrochlorothiazide (HYZAAR) 100-25 MG tablet Take 30 tablets by mouth daily. 90 tablet 0  . Magnesium 400 MG CAPS Take by mouth 2 (two) times daily.     . metFORMIN (GLUCOPHAGE-XR) 500 MG 24 hr tablet TAKE 4 TABLETS BY MOUTH DAILY AS DIRECTED 120 tablet 0  . Probiotic Product (PROBIOTIC DAILY PO) Take by mouth daily.    . rosuvastatin (CRESTOR) 10 MG tablet TAKE 1 TABLET BY MOUTH EVERY DAY 90 tablet 1  . hyoscyamine (LEVBID) 0.375 MG 12 hr tablet Take 1 -2 tabs Twice Daily prn (Patient not taking: Reported on 03/01/2015) 10 tablet 0   No current facility-administered medications on file prior to visit.    Health Maintenance:  Immunization History  Administered Date(s) Administered  . PPD Test 01/30/2014  . Pneumococcal Polysaccharide-23 05/21/2008  . Td 10/30/2003  . Tdap 01/30/2014    Tetanus: UTD Pneumovax: UTD Flu vaccine: Done today Colonoscopy: He is due, he is going to schedule with Dr. Molli P[facility]Jacobs [facility] Exam: UTD Dentist:  Sees his dentist at least yearly  Patient Care Team: Lucky Cowboy, MD as PCP - General (Internal Medicine)  Allergies: No Known Allergies  Medical History:  Past Medical History  Diagnosis Date  . Hyperlipemia   . Fatigue   . Chest pain   . GERD (gastroesophageal reflux disease)   . Anxiety     On Xanax  . Vitamin D deficiency   . OSA (obstructive sleep apnea)     Restated CPAP  . IBS (irritable bowel syndrome)   . HTN (hypertension)   . Type II or unspecified type diabetes mellitus without mention of complication, not stated as uncontrolled     Surgical History:  Past Surgical History  Procedure Laterality Date  . Wisdom tooth  extraction      Family History:  Family History  Problem Relation Age of Onset  . Stroke    . Hypertension    . Hyperlipidemia    . Diabetes      Social History:   Social History  Substance Use Topics  . Smoking status: Former Smoker    Quit date: 09/06/2007  . Smokeless tobacco: None  . Alcohol Use: Yes     Comment: occasional    Review of Systems:  Review of Systems  Constitutional: Negative for fever, chills and malaise/fatigue.  Skin: Negative.     Physical Exam: Estimated body mass index is 31.28 kg/(m^2) as calculated from the following:   Height as of this encounter: 6' 0.5" (1.842 m).   Weight as of this encounter: 234 lb (106.142 kg). BP 122/74 mmHg  Pulse 68  Temp(Src) 98.4 F (36.9 C) (Temporal)  Resp 16  Ht 6' 0.5" (1.842 m)  Wt 234 lb (106.142 kg)  BMI 31.28 kg/m2  General Appearance: Well nourished, in no apparent distress.  Eyes: PERRLA, EOMs, conjunctiva no swelling or erythema ENT/Mouth: Ear canals clear bilaterally with no erythema, swelling, discharge.  TMs normal bilaterally with no erythema, bulging, or retractions.  Oropharynx clear and moist with no exudate, swelling, or erythema.  Dentition normal.   Neck: Supple, thyroid normal. No bruits, JVD, cervical adenopathy Respiratory: Respiratory effort normal, BS equal bilaterally without rales, rhonchi, wheezing or stridor.  Cardio: RRR without murmurs, rubs or gallops. Brisk peripheral pulses without edema.  Chest: symmetric, with normal excursions Abdomen: Soft, nontender, no guarding, rebound, hernias, masses, or organomegaly. Musculoskeletal: Full ROM all peripheral extremities,5/5 strength, and normal gait.  Skin: Warm, dry without rashes, lesions, ecchymosis. Normal testing on foot exam Neuro: A&Ox3, Cranial nerves intact, reflexes equal bilaterally. Normal muscle tone, no cerebellar symptoms. Sensation intact.  Psych: Normal affect, Insight and Judgment appropriate.   EKG: WNL no  changes.  AORTA SCAN: WNL  Over 40 minutes of exam, counseling, chart review and critical decision making was performed  Terri Piedra 4:42 PM [facility]

## 2015-03-02 LAB — PSA: PSA: 1.3 ng/mL (ref <=4.00)

## 2015-03-02 LAB — URINALYSIS, ROUTINE W REFLEX MICROSCOPIC
BILIRUBIN URINE: NEGATIVE
GLUCOSE, UA: NEGATIVE
HGB URINE DIPSTICK: NEGATIVE
KETONES UR: NEGATIVE
LEUKOCYTES UA: NEGATIVE
Nitrite: NEGATIVE
PROTEIN: NEGATIVE
Specific Gravity, Urine: 1.023 (ref 1.001–1.035)
pH: 5.5 (ref 5.0–8.0)

## 2015-03-02 LAB — TESTOSTERONE: Testosterone: 154 ng/dL — ABNORMAL LOW (ref 300–890)

## 2015-03-02 LAB — MICROALBUMIN / CREATININE URINE RATIO
Creatinine, Urine: 182 mg/dL (ref 20–370)
Microalb Creat Ratio: 6 mcg/mg creat (ref <30)
Microalb, Ur: 1.1 mg/dL

## 2015-03-02 LAB — TSH: TSH: 1.461 u[IU]/mL (ref 0.350–4.500)

## 2015-03-02 LAB — INSULIN, RANDOM: Insulin: 10 u[IU]/mL (ref 2.0–19.6)

## 2015-03-02 LAB — VITAMIN D 25 HYDROXY (VIT D DEFICIENCY, FRACTURES): Vit D, 25-Hydroxy: 47 ng/mL (ref 30–100)

## 2015-03-02 LAB — HEMOGLOBIN A1C
HEMOGLOBIN A1C: 6.6 % — AB (ref <5.7)
Mean Plasma Glucose: 143 mg/dL — ABNORMAL HIGH (ref <117)

## 2015-03-02 LAB — VITAMIN B12: VITAMIN B 12: 699 pg/mL (ref 211–911)

## 2015-03-02 NOTE — Addendum Note (Signed)
Addended by: Dontel Harshberger A on: 03/02/2015 08:22 AM   Modules accepted: Orders

## 2015-04-29 ENCOUNTER — Encounter: Payer: Self-pay | Admitting: Internal Medicine

## 2015-06-14 ENCOUNTER — Ambulatory Visit: Payer: Self-pay | Admitting: Internal Medicine

## 2015-08-09 ENCOUNTER — Other Ambulatory Visit: Payer: Self-pay | Admitting: Internal Medicine

## 2015-08-20 ENCOUNTER — Other Ambulatory Visit: Payer: Self-pay | Admitting: *Deleted

## 2015-08-20 ENCOUNTER — Ambulatory Visit (INDEPENDENT_AMBULATORY_CARE_PROVIDER_SITE_OTHER): Payer: 59 | Admitting: Physician Assistant

## 2015-08-20 ENCOUNTER — Encounter: Payer: Self-pay | Admitting: Internal Medicine

## 2015-08-20 VITALS — BP 104/66 | HR 72 | Temp 97.0°F | Resp 16 | Ht 72.5 in | Wt 238.4 lb

## 2015-08-20 DIAGNOSIS — E119 Type 2 diabetes mellitus without complications: Secondary | ICD-10-CM | POA: Diagnosis not present

## 2015-08-20 DIAGNOSIS — Z79899 Other long term (current) drug therapy: Secondary | ICD-10-CM

## 2015-08-20 DIAGNOSIS — E349 Endocrine disorder, unspecified: Secondary | ICD-10-CM

## 2015-08-20 DIAGNOSIS — I10 Essential (primary) hypertension: Secondary | ICD-10-CM

## 2015-08-20 DIAGNOSIS — E785 Hyperlipidemia, unspecified: Secondary | ICD-10-CM | POA: Diagnosis not present

## 2015-08-20 DIAGNOSIS — E291 Testicular hypofunction: Secondary | ICD-10-CM

## 2015-08-20 DIAGNOSIS — E559 Vitamin D deficiency, unspecified: Secondary | ICD-10-CM

## 2015-08-20 DIAGNOSIS — E669 Obesity, unspecified: Secondary | ICD-10-CM

## 2015-08-20 DIAGNOSIS — E66811 Obesity, class 1: Secondary | ICD-10-CM

## 2015-08-20 HISTORY — DX: Obesity, unspecified: E66.9

## 2015-08-20 HISTORY — DX: Endocrine disorder, unspecified: E34.9

## 2015-08-20 HISTORY — DX: Obesity, class 1: E66.811

## 2015-08-20 LAB — LIPID PANEL
CHOL/HDL RATIO: 3.5 ratio (ref <=5.0)
CHOLESTEROL: 115 mg/dL — AB (ref 125–200)
HDL: 33 mg/dL — ABNORMAL LOW (ref >=40)
LDL Cholesterol: 60 mg/dL (ref <130)
TRIGLYCERIDES: 108 mg/dL (ref <150)
VLDL: 22 mg/dL (ref <30)

## 2015-08-20 LAB — CBC WITH DIFFERENTIAL/PLATELET
BASOS ABS: 53 {cells}/uL (ref 0–200)
Basophils Relative: 1 %
EOS PCT: 5 %
Eosinophils Absolute: 265 cells/uL (ref 15–500)
HCT: 37.3 % — ABNORMAL LOW (ref 38.5–50.0)
HEMOGLOBIN: 12.5 g/dL — AB (ref 13.2–17.1)
LYMPHS ABS: 3233 {cells}/uL (ref 850–3900)
LYMPHS PCT: 61 %
MCH: 30.7 pg (ref 27.0–33.0)
MCHC: 33.5 g/dL (ref 32.0–36.0)
MCV: 91.6 fL (ref 80.0–100.0)
MONOS PCT: 5 %
MPV: 10.1 fL (ref 7.5–12.5)
Monocytes Absolute: 265 cells/uL (ref 200–950)
NEUTROS PCT: 28 %
Neutro Abs: 1484 cells/uL — ABNORMAL LOW (ref 1500–7800)
PLATELETS: 304 10*3/uL (ref 140–400)
RBC: 4.07 MIL/uL — AB (ref 4.20–5.80)
RDW: 13.2 % (ref 11.0–15.0)
WBC: 5.3 10*3/uL (ref 3.8–10.8)

## 2015-08-20 LAB — HEPATIC FUNCTION PANEL
ALT: 20 U/L (ref 9–46)
AST: 18 U/L (ref 10–35)
Albumin: 4 g/dL (ref 3.6–5.1)
Alkaline Phosphatase: 53 U/L (ref 40–115)
BILIRUBIN DIRECT: 0.1 mg/dL (ref <=0.2)
BILIRUBIN INDIRECT: 0.3 mg/dL (ref 0.2–1.2)
BILIRUBIN TOTAL: 0.4 mg/dL (ref 0.2–1.2)
Total Protein: 7 g/dL (ref 6.1–8.1)

## 2015-08-20 LAB — BASIC METABOLIC PANEL WITH GFR
BUN: 15 mg/dL (ref 7–25)
CALCIUM: 9.7 mg/dL (ref 8.6–10.3)
CO2: 26 mmol/L (ref 20–31)
CREATININE: 0.84 mg/dL (ref 0.70–1.25)
Chloride: 101 mmol/L (ref 98–110)
Glucose, Bld: 120 mg/dL — ABNORMAL HIGH (ref 65–99)
Potassium: 4.3 mmol/L (ref 3.5–5.3)
SODIUM: 139 mmol/L (ref 135–146)

## 2015-08-20 LAB — MAGNESIUM: MAGNESIUM: 1.5 mg/dL (ref 1.5–2.5)

## 2015-08-20 LAB — HEMOGLOBIN A1C
Hgb A1c MFr Bld: 7 % — ABNORMAL HIGH (ref <5.7)
MEAN PLASMA GLUCOSE: 154 mg/dL

## 2015-08-20 LAB — TSH: TSH: 1.89 m[IU]/L (ref 0.40–4.50)

## 2015-08-20 MED ORDER — HYOSCYAMINE SULFATE ER 0.375 MG PO TB12: ORAL_TABLET | ORAL | Status: DC

## 2015-08-20 NOTE — Progress Notes (Signed)
Assessment and Plan:  1. Hypertension -Continue medication, if looks dehydrated then may need to decrease HCTZ  - monitor blood pressure at home. Continue DASH diet.  Reminder to go to the ER if any CP, SOB, nausea, dizziness, severe HA, changes vision/speech, left arm numbness and tingling and jaw pain.  2. Cholesterol -Continue diet and exercise. Check cholesterol.   3. Diabetes without complications -Continue diet and exercise. Check A1C Stop fruit juice  4. Vitamin D Def - check level and continue medications.   5. Obesity with co morbidities - long discussion about weight loss, diet, and exercise  6. Fatigue - try natural ways to increase testosterone, get back on CPAP, control sugars.    Continue diet and meds as discussed. Further disposition pending results of labs. Discussed med's effects and SE's.   Over 30 minutes of exam, counseling, chart review, and critical decision making was performed   HPI 65 y.o. male  presents for 3 month follow up on hypertension, cholesterol, diabetes and vitamin D deficiency.   His blood pressure has been controlled at home, today his BP is BP: 104/66 mmHg.  He does workout. He denies chest pain, shortness of breath, dizziness.  He is on cholesterol medication and denies myalgias. His cholesterol is at goal. The cholesterol was:   Lab Results  Component Value Date   CHOL 129 03/01/2015   HDL 34* 03/01/2015   LDLCALC 74 03/01/2015   TRIG 107 03/01/2015   CHOLHDL 3.8 03/01/2015    He has been working on diet and exercise for diabetes without complications, he is on bASA, he is on ACE/ARB, and denies  paresthesia of the feet, polydipsia, polyuria and visual disturbances. Last A1C was:  Lab Results  Component Value Date   HGBA1C 6.6* 03/01/2015   Patient is on Vitamin D supplement. Lab Results  Component Value Date   VD25OH 47 03/01/2015   BMI is Body mass index is 31.87 kg/(m^2)., he is working on diet and exercise. Wt Readings  from Last 3 Encounters:  08/20/15 238 lb 6.4 oz (108.138 kg)  03/01/15 234 lb (106.142 kg)  09/10/14 227 lb 3.2 oz (103.057 kg)    Current Medications:  Current Outpatient Prescriptions on File Prior to Visit  Medication Sig Dispense Refill  . ALPRAZolam (XANAX) 1 MG tablet Take 1 mg by mouth at bedtime as needed.     Marland Kitchen aspirin 81 MG tablet Take 81 mg by mouth daily.    Marland Kitchen atenolol (TENORMIN) 100 MG tablet Take 1 tablet (100 mg total) by mouth daily. 30 tablet 0  . Cholecalciferol (VITAMIN D3) 2000 UNITS capsule Take 2,000 Units by mouth daily. Takes 6000 units daily    . citalopram (CELEXA) 40 MG tablet TAKE 1 TABLET BY MOUTH EVERY DAY 90 tablet 4  . losartan-hydrochlorothiazide (HYZAAR) 100-25 MG tablet Take 1 tablet by mouth daily. 90 tablet 2  . Magnesium 400 MG CAPS Take by mouth 2 (two) times daily.     . metFORMIN (GLUCOPHAGE-XR) 500 MG 24 hr tablet TAKE 4 TABLETS BY MOUTH DAILY AS DIRECTED 120 tablet 0  . Probiotic Product (PROBIOTIC DAILY PO) Take by mouth daily.    . rosuvastatin (CRESTOR) 10 MG tablet TAKE 1 TABLET BY MOUTH EVERY DAY 90 tablet 1   No current facility-administered medications on file prior to visit.   Medical History:  Past Medical History  Diagnosis Date  . Hyperlipemia   . Fatigue   . Chest pain   . GERD (gastroesophageal reflux  disease)   . Anxiety     On Xanax  . Vitamin D deficiency   . OSA (obstructive sleep apnea)     Restated CPAP  . IBS (irritable bowel syndrome)   . HTN (hypertension)   . Type II or unspecified type diabetes mellitus without mention of complication, not stated as uncontrolled    Allergies: No Known Allergies   Review of Systems:  Review of Systems  Constitutional: Negative.  Negative for fever, chills and malaise/fatigue.  HENT: Negative.   Eyes: Negative.   Respiratory: Negative.   Cardiovascular: Negative.   Gastrointestinal: Negative.   Genitourinary: Negative.   Musculoskeletal: Negative.   Skin: Negative.    Neurological: Negative.   Endo/Heme/Allergies: Negative.   Psychiatric/Behavioral: Negative.     Family history- Review and unchanged Social history- Review and unchanged Physical Exam: BP 104/66 mmHg  Pulse 72  Temp(Src) 97 F (36.1 C)  Resp 16  Ht 6' 0.5" (1.842 m)  Wt 238 lb 6.4 oz (108.138 kg)  BMI 31.87 kg/m2 Wt Readings from Last 3 Encounters:  08/20/15 238 lb 6.4 oz (108.138 kg)  03/01/15 234 lb (106.142 kg)  09/10/14 227 lb 3.2 oz (103.057 kg)   General Appearance: Well nourished, in no apparent distress. Eyes: PERRLA, EOMs, conjunctiva no swelling or erythema Sinuses: No Frontal/maxillary tenderness ENT/Mouth: Ext aud canals clear, TMs without erythema, bulging. No erythema, swelling, or exudate on post pharynx.  Tonsils not swollen or erythematous. Hearing normal.  Neck: Supple, thyroid normal.  Respiratory: Respiratory effort normal, BS equal bilaterally without rales, rhonchi, wheezing or stridor.  Cardio: RRR with no MRGs. Brisk peripheral pulses without edema.  Abdomen: Soft, + BS.  Non tender, no guarding, rebound, hernias, masses. Lymphatics: Non tender without lymphadenopathy.  Musculoskeletal: Full ROM, 5/5 strength, Normal gait Skin: Warm, dry without rashes, lesions, ecchymosis.  Neuro: Cranial nerves intact. No cerebellar symptoms.  Psych: Awake and oriented X 3, normal affect, Insight and Judgment appropriate.    Quentin MullingAmanda Adalberto Metzgar, PA-C 12:19 PM [facility]

## 2015-08-20 NOTE — Patient Instructions (Signed)
9 Ways to Naturally Increase Testosterone Levels  1.   Lose Weight If you're overweight, shedding the excess pounds may increase your testosterone levels, according to research presented at the Endocrine Society's 2012 meeting. Overweight men are more likely to have low testosterone levels to begin with, so this is an important trick to increase your body's testosterone production when you need it most.  2.   High-Intensity Exercise like Peak Fitness  Short intense exercise has a proven positive effect on increasing testosterone levels and preventing its decline. That's unlike aerobics or prolonged moderate exercise, which have shown to have negative or no effect on testosterone levels. Having a whey protein meal after exercise can further enhance the satiety/testosterone-boosting impact (hunger hormones cause the opposite effect on your testosterone and libido). Here's a summary of what a typical high-intensity Peak Fitness routine might look like: " Warm up for three minutes  " Exercise as hard and fast as you can for 30 seconds. You should feel like you couldn't possibly go on another few seconds  " Recover at a slow to moderate pace for 90 seconds  " Repeat the high intensity exercise and recovery 7 more times .  3.   Consume Plenty of Zinc The mineral zinc is important for testosterone production, and supplementing your diet for as little as six weeks has been shown to cause a marked improvement in testosterone among men with low levels.1 Likewise, research has shown that restricting dietary sources of zinc leads to a significant decrease in testosterone, while zinc supplementation increases it2 -- and even protects men from exercised-induced reductions in testosterone levels.3 It's estimated that up to 45 percent of adults over the age of 60 may have lower than recommended zinc intakes; even when dietary supplements were added in, an estimated 20-25 percent of older adults still had inadequate  zinc intakes, according to a National Health and Nutrition Examination Survey.4 Your diet is the best source of zinc; along with protein-rich foods like meats and fish, other good dietary sources of zinc include raw milk, raw cheese, beans, and yogurt or kefir made from raw milk. It can be difficult to obtain enough dietary zinc if you're a vegetarian, and also for meat-eaters as well, largely because of conventional farming methods that rely heavily on chemical fertilizers and pesticides. These chemicals deplete the soil of nutrients ... nutrients like zinc that must be absorbed by plants in order to be passed on to you. In many cases, you may further deplete the nutrients in your food by the way you prepare it. For most food, cooking it will drastically reduce its levels of nutrients like zinc . particularly over-cooking, which many people do. If you decide to use a zinc supplement, stick to a dosage of less than 40 mg a day, as this is the recommended adult upper limit. Taking too much zinc can interfere with your body's ability to absorb other minerals, especially copper, and may cause nausea as a side effect.  4.   Strength Training In addition to Peak Fitness, strength training is also known to boost testosterone levels, provided you are doing so intensely enough. When strength training to boost testosterone, you'll want to increase the weight and lower your number of reps, and then focus on exercises that work a large number of muscles, such as dead lifts or squats.  You can "turbo-charge" your weight training by going slower. By slowing down your movement, you're actually turning it into a high-intensity exercise. Super Slow   movement allows your muscle, at the microscopic level, to access the maximum number of cross-bridges between the protein filaments that produce movement in the muscle.   5.   Optimize Your Vitamin D Levels Vitamin D, a steroid hormone, is essential for the healthy development  of the nucleus of the sperm cell, and helps maintain semen quality and sperm count. Vitamin D also increases levels of testosterone, which may boost libido. In one study, overweight men who were given vitamin D supplements had a significant increase in testosterone levels after one year.5   6.   Reduce Stress When you're under a lot of stress, your body releases high levels of the stress hormone cortisol. This hormone actually blocks the effects of testosterone,6 presumably because, from a biological standpoint, testosterone-associated behaviors (mating, competing, aggression) may have lowered your chances of survival in an emergency (hence, the "fight or flight" response is dominant, courtesy of cortisol).  7.   Limit or Eliminate Sugar from Your Diet Testosterone levels decrease after you eat sugar, which is likely because the sugar leads to a high insulin level, another factor leading to low testosterone.7 Based on USDA estimates, the average American consumes 12 teaspoons of sugar a day, which equates to about TWO TONS of sugar during a lifetime.  8.   Eat Healthy Fats By healthy, this means not only mon- and polyunsaturated fats, like that found in avocadoes and nuts, but also saturated, as these are essential for building testosterone. Research shows that a diet with less than 40 percent of energy as fat (and that mainly from animal sources, i.e. saturated) lead to a decrease in testosterone levels.8 My personal diet is about 60-70 percent healthy fat, and other experts agree that the ideal diet includes somewhere between 50-70 percent fat.  It's important to understand that your body requires saturated fats from animal and vegetable sources (such as meat, dairy, certain oils, and tropical plants like coconut) for optimal functioning, and if you neglect this important food group in favor of sugar, grains and other starchy carbs, your health and weight are almost guaranteed to suffer. Examples of  healthy fats you can eat more of to give your testosterone levels a boost include: Olives and Olive oil  Coconuts and coconut oil Butter made from raw grass-fed organic milk Raw nuts, such as, almonds or pecans Organic pastured egg yolks Avocados Grass-fed meats Palm oil Unheated organic nut oils   9.   Boost Your Intake of Branch Chain Amino Acids (BCAA) from Foods Like Whey Protein Research suggests that BCAAs result in higher testosterone levels, particularly when taken along with resistance training.9 While BCAAs are available in supplement form, you'll find the highest concentrations of BCAAs like leucine in dairy products - especially quality cheeses and whey protein. Even when getting leucine from your natural food supply, it's often wasted or used as a building block instead of an anabolic agent. So to create the correct anabolic environment, you need to boost leucine consumption way beyond mere maintenance levels. That said, keep in mind that using leucine as a free form amino acid can be highly counterproductive as when free form amino acids are artificially administrated, they rapidly enter your circulation while disrupting insulin function, and impairing your body's glycemic control. Food-based leucine is really the ideal form that can benefit your muscles without side effects.   Diabetes is a very complicated disease...lets simplify it.  An easy way to look at it to understand the complications is if you think   of the extra sugar floating in your blood stream as glass shards floating through your blood stream.    Diabetes affects your small vessels first: 1) The glass shards (sugar) scraps down the tiny blood vessels in your eyes and lead to diabetic retinopathy, the leading cause of blindness in the [place]. Diabetes is  the leading cause of newly diagnosed adult (20 to 63 years of age) blindness in the [place].  2) The glass shards scratches down the tiny vessels of your legs leading  to nerve damage called neuropathy and can lead to amputations of your feet. More than 60% of all non-traumatic amputations of lower limbs occur in people with diabetes.  3) Over time the small vessels in your brain are shredded and closed off, individually this does not cause any problems but over a long period of time many of the small vessels being blocked can lead to Vascular Dementia.   4) Your kidney's are a filter system and have a "net" that keeps certain things in the body and lets bad things out. Sugar shreds this net and leads to kidney damage and eventually failure. Decreasing the sugar that is destroying the net and certain blood pressure medications can help stop or decrease progression of kidney disease. Diabetes was the primary cause of kidney failure in 44 percent of all new cases in 2011.  5) Diabetes also destroys the small vessels in your penis that lead to erectile dysfunction. Eventually the vessels are so damaged that you may not be responsive to cialis or viagra.   Diabetes and your large vessels: Your larger vessels consist of your coronary arteries in your heart and the carotid vessels to your brain. Diabetes or even increased sugars put you at 300% increased risk of heart attack and stroke and this is why.. The sugar scrapes down your large blood vessels and your body sees this as an internal injury and tries to repair itself. Just like you get a scab on your skin, your platelets will stick to the blood vessel wall trying to heal it. This is why we have diabetics on low dose aspirin daily, this prevents the platelets from sticking and can prevent plaque formation. In addition, your body takes cholesterol and tries to shove it into the open wound. This is why we want your LDL, or bad cholesterol, below 70.   The combination of platelets and cholesterol over 5-10 years forms plaque that can break off and cause a heart attack or stroke.   PLEASE REMEMBER:  Diabetes is  preventable! Up to 85 percent of complications and morbidities among individuals with type 2 diabetes can be prevented, delayed, or effectively treated and minimized with regular visits to a health professional, appropriate monitoring and medication, and a healthy diet and lifestyle.     Bad carbs also include fruit juice, alcohol, and sweet tea. These are empty calories that do not signal to your brain that you are full.   Please remember the good carbs are still carbs which convert into sugar. So please measure them out no more than 1/2-1 cup of rice, oatmeal, pasta, and beans  Veggies are however free foods! Pile them on.   Not all fruit is created equal. Please see the list below, the fruit at the bottom is higher in sugars than the fruit at the top. Please avoid all dried fruits.      

## 2015-08-21 LAB — VITAMIN D 25 HYDROXY (VIT D DEFICIENCY, FRACTURES): Vit D, 25-Hydroxy: 40 ng/mL (ref 30–100)

## 2015-10-26 ENCOUNTER — Other Ambulatory Visit: Payer: Self-pay | Admitting: *Deleted

## 2015-10-26 DIAGNOSIS — I10 Essential (primary) hypertension: Secondary | ICD-10-CM

## 2015-10-26 DIAGNOSIS — E119 Type 2 diabetes mellitus without complications: Secondary | ICD-10-CM

## 2015-10-26 MED ORDER — ALPRAZOLAM 1 MG PO TABS: 1.0000 mg | ORAL_TABLET | Freq: Every evening | ORAL | Status: DC | PRN

## 2015-10-26 MED ORDER — CITALOPRAM HYDROBROMIDE 40 MG PO TABS: ORAL_TABLET | ORAL | Status: DC

## 2015-10-26 MED ORDER — METFORMIN HCL ER 500 MG PO TB24: ORAL_TABLET | ORAL | Status: DC

## 2015-10-26 MED ORDER — ATENOLOL 100 MG PO TABS: 100.0000 mg | ORAL_TABLET | Freq: Every day | ORAL | Status: DC

## 2015-10-26 MED ORDER — HYOSCYAMINE SULFATE ER 0.375 MG PO TB12: ORAL_TABLET | ORAL | Status: DC

## 2015-10-26 MED ORDER — LOSARTAN POTASSIUM-HCTZ 100-25 MG PO TABS: 1.0000 | ORAL_TABLET | Freq: Every day | ORAL | Status: DC

## 2015-10-26 MED ORDER — ROSUVASTATIN CALCIUM 10 MG PO TABS: ORAL_TABLET | ORAL | Status: DC

## 2015-10-27 ENCOUNTER — Other Ambulatory Visit: Payer: Self-pay | Admitting: *Deleted

## 2015-10-27 DIAGNOSIS — I10 Essential (primary) hypertension: Secondary | ICD-10-CM

## 2015-10-27 MED ORDER — ATENOLOL 50 MG PO TABS: ORAL_TABLET | ORAL | Status: DC

## 2015-11-26 ENCOUNTER — Ambulatory Visit (INDEPENDENT_AMBULATORY_CARE_PROVIDER_SITE_OTHER): Payer: 59 | Admitting: Internal Medicine

## 2015-11-26 ENCOUNTER — Encounter: Payer: Self-pay | Admitting: Internal Medicine

## 2015-11-26 VITALS — BP 108/64 | HR 68 | Temp 98.0°F | Resp 18 | Ht 72.5 in | Wt 227.0 lb

## 2015-11-26 DIAGNOSIS — G4733 Obstructive sleep apnea (adult) (pediatric): Secondary | ICD-10-CM

## 2015-11-26 DIAGNOSIS — E559 Vitamin D deficiency, unspecified: Secondary | ICD-10-CM | POA: Diagnosis not present

## 2015-11-26 DIAGNOSIS — E785 Hyperlipidemia, unspecified: Secondary | ICD-10-CM

## 2015-11-26 DIAGNOSIS — E119 Type 2 diabetes mellitus without complications: Secondary | ICD-10-CM | POA: Diagnosis not present

## 2015-11-26 DIAGNOSIS — I10 Essential (primary) hypertension: Secondary | ICD-10-CM | POA: Diagnosis not present

## 2015-11-26 DIAGNOSIS — Z79899 Other long term (current) drug therapy: Secondary | ICD-10-CM | POA: Diagnosis not present

## 2015-11-26 LAB — BASIC METABOLIC PANEL WITH GFR
BUN: 19 mg/dL (ref 7–25)
CALCIUM: 9.7 mg/dL (ref 8.6–10.3)
CO2: 26 mmol/L (ref 20–31)
CREATININE: 1 mg/dL (ref 0.70–1.25)
Chloride: 103 mmol/L (ref 98–110)
GFR, Est African American: >89 mL/min (ref >=60)
GFR, Est Non African American: 80 mL/min (ref >=60)
Glucose, Bld: 113 mg/dL — ABNORMAL HIGH (ref 65–99)
Potassium: 4 mmol/L (ref 3.5–5.3)
SODIUM: 138 mmol/L (ref 135–146)

## 2015-11-26 LAB — LIPID PANEL
CHOLESTEROL: 121 mg/dL — AB (ref 125–200)
HDL: 35 mg/dL — ABNORMAL LOW (ref >=40)
LDL Cholesterol: 68 mg/dL (ref <130)
TRIGLYCERIDES: 88 mg/dL (ref <150)
Total CHOL/HDL Ratio: 3.5 Ratio (ref <=5.0)
VLDL: 18 mg/dL (ref <30)

## 2015-11-26 LAB — CBC WITH DIFFERENTIAL/PLATELET
BASOS PCT: 1 %
Basophils Absolute: 60 cells/uL (ref 0–200)
EOS PCT: 4 %
Eosinophils Absolute: 240 cells/uL (ref 15–500)
HCT: 38.3 % — ABNORMAL LOW (ref 38.5–50.0)
HEMOGLOBIN: 12.7 g/dL — AB (ref 13.2–17.1)
LYMPHS ABS: 3180 {cells}/uL (ref 850–3900)
Lymphocytes Relative: 53 %
MCH: 30.3 pg (ref 27.0–33.0)
MCHC: 33.2 g/dL (ref 32.0–36.0)
MCV: 91.4 fL (ref 80.0–100.0)
MPV: 10.1 fL (ref 7.5–12.5)
Monocytes Absolute: 420 cells/uL (ref 200–950)
Monocytes Relative: 7 %
NEUTROS ABS: 2100 {cells}/uL (ref 1500–7800)
Neutrophils Relative %: 35 %
PLATELETS: 296 10*3/uL (ref 140–400)
RBC: 4.19 MIL/uL — AB (ref 4.20–5.80)
RDW: 13.2 % (ref 11.0–15.0)
WBC: 6 10*3/uL (ref 3.8–10.8)

## 2015-11-26 LAB — HEPATIC FUNCTION PANEL
ALBUMIN: 4 g/dL (ref 3.6–5.1)
ALT: 15 U/L (ref 9–46)
AST: 18 U/L (ref 10–35)
Alkaline Phosphatase: 52 U/L (ref 40–115)
BILIRUBIN DIRECT: 0.1 mg/dL (ref <=0.2)
BILIRUBIN TOTAL: 0.3 mg/dL (ref 0.2–1.2)
Indirect Bilirubin: 0.2 mg/dL (ref 0.2–1.2)
Total Protein: 7.3 g/dL (ref 6.1–8.1)

## 2015-11-26 LAB — TSH: TSH: 1.21 m[IU]/L (ref 0.40–4.50)

## 2015-11-26 LAB — HEMOGLOBIN A1C
Hgb A1c MFr Bld: 6.3 % — ABNORMAL HIGH (ref <5.7)
Mean Plasma Glucose: 134 mg/dL

## 2015-11-26 NOTE — Progress Notes (Signed)
Assessment and Plan:  Hypertension:  -Continue medication -monitor blood pressure at home. -Continue DASH diet -Reminder to go to the ER if any CP, SOB, nausea, dizziness, severe HA, changes vision/speech, left arm numbness and tingling and jaw pain.  Cholesterol - Continue diet and exercise -Check cholesterol.   Diabetes without complications  -adjust medications if not at goal -Continue diet and exercise.  -Check A1C  Vitamin D Def -check level -continue medications.   OSA -apnea link at home test -likely needs CPAP machine  Continue diet and meds as discussed. Further disposition pending results of labs. Discussed med's effects and SE's.    HPI 65 y.o. male  presents for 3 month follow up with hypertension, hyperlipidemia, diabetes and vitamin D deficiency.   His blood pressure has been controlled at home, today their BP is BP: 108/64 mmHg.He does workout. He denies chest pain, shortness of breath, dizziness.   He is on cholesterol medication and denies myalgias. His cholesterol is at goal. The cholesterol was:  08/20/2015: Cholesterol 115*; HDL 33*; LDL Cholesterol 60; Triglycerides 108   He has been working on diet and exercise for diabetes without complications, he is on bASA, he is on ACE/ARB, and denies  foot ulcerations, hyperglycemia, hypoglycemia , increased appetite, nausea, paresthesia of the feet, polydipsia, polyuria, visual disturbances, vomiting and weight loss. Last A1C was: 08/20/2015: Hgb A1c MFr Bld 7.0*   Patient is on Vitamin D supplement. 08/20/2015: Vit D, 25-Hydroxy 40  Patient reports that sometimes he wakes up in the middle of the night gasping.  He reports that he feels like he can't breath well.  No nightmares.  He does have a history of obstructive sleep apnea.  He reports that he is not using a cpap machine.  He was told to lose weight.    Current Medications:  Current Outpatient Prescriptions on File Prior to Visit  Medication Sig Dispense Refill   . ALPRAZolam (XANAX) 1 MG tablet Take 1 tablet (1 mg total) by mouth at bedtime as needed. 90 tablet 0  . aspirin 81 MG tablet Take 81 mg by mouth daily.    Marland Kitchen. atenolol (TENORMIN) 50 MG tablet Take 2 tablets daily for BP. 180 tablet 0  . Cholecalciferol (VITAMIN D3) 2000 UNITS capsule Take 2,000 Units by mouth daily. Takes 6000 units daily    . citalopram (CELEXA) 40 MG tablet TAKE 1 TABLET BY MOUTH EVERY DAY 90 tablet 0  . hyoscyamine (LEVBID) 0.375 MG 12 hr tablet Take 1 -2 tabs Twice Daily prn 180 tablet 0  . losartan-hydrochlorothiazide (HYZAAR) 100-25 MG tablet Take 1 tablet by mouth daily. 90 tablet 0  . Magnesium 400 MG CAPS Take by mouth 2 (two) times daily.     . metFORMIN (GLUCOPHAGE-XR) 500 MG 24 hr tablet TAKE 4 TABLETS BY MOUTH DAILY AS DIRECTED 360 tablet 0  . Probiotic Product (PROBIOTIC DAILY PO) Take by mouth daily.    . rosuvastatin (CRESTOR) 10 MG tablet TAKE 1 TABLET BY MOUTH EVERY DAY 90 tablet 0   No current facility-administered medications on file prior to visit.   Medical History:  Past Medical History  Diagnosis Date  . Hyperlipemia   . Fatigue   . Chest pain   . GERD (gastroesophageal reflux disease)   . Anxiety     On Xanax  . Vitamin D deficiency   . OSA (obstructive sleep apnea)     Restated CPAP  . IBS (irritable bowel syndrome)   . HTN (hypertension)   .  Type II or unspecified type diabetes mellitus without mention of complication, not stated as uncontrolled    Allergies: No Known Allergies   Review of Systems:  Review of Systems  Constitutional: Negative for fever, chills and malaise/fatigue.  HENT: Negative for congestion, ear pain and sore throat.   Eyes: Negative.   Respiratory: Negative for cough, shortness of breath and wheezing.   Cardiovascular: Negative for chest pain, palpitations and leg swelling.  Gastrointestinal: Negative for heartburn, abdominal pain, diarrhea, constipation, blood in stool and melena.  Genitourinary: Negative.    Skin: Negative.   Neurological: Negative for dizziness, sensory change, loss of consciousness and headaches.  Psychiatric/Behavioral: Negative for depression. The patient is not nervous/anxious and does not have insomnia.     Family history- Review and unchanged  Social history- Review and unchanged  Physical Exam: BP 108/64 mmHg  Pulse 68  Temp(Src) 98 F (36.7 C) (Temporal)  Resp 18  Ht 6' 0.5" (1.842 m)  Wt 227 lb (102.967 kg)  BMI 30.35 kg/m2 Wt Readings from Last 3 Encounters:  11/26/15 227 lb (102.967 kg)  08/20/15 238 lb 6.4 oz (108.138 kg)  03/01/15 234 lb (106.142 kg)   General Appearance: Well nourished well developed, non-toxic appearing, in no apparent distress. Eyes: PERRLA, EOMs, conjunctiva no swelling or erythema ENT/Mouth: Ear canals clear with no erythema, swelling, or discharge.  TMs normal bilaterally, oropharynx clear, moist, with no exudate.   Neck: Supple, thyroid normal, no JVD, no cervical adenopathy.  Respiratory: Respiratory effort normal, breath sounds clear A&P, no wheeze, rhonchi or rales noted.  No retractions, no accessory muscle usage Cardio: RRR with no MRGs. No noted edema.  Abdomen: Soft, + BS.  Non tender, no guarding, rebound, hernias, masses. Musculoskeletal: Full ROM, 5/5 strength, Normal gait Skin: Warm, dry without rashes, lesions, ecchymosis.  Neuro: Awake and oriented X 3, Cranial nerves intact. No cerebellar symptoms.  Psych: normal affect, Insight and Judgment appropriate.    Terri Piedra, PA-C 9:25 AM [facility]

## 2015-12-27 ENCOUNTER — Institutional Professional Consult (permissible substitution): Payer: 59 | Admitting: Neurology

## 2016-01-03 ENCOUNTER — Ambulatory Visit (INDEPENDENT_AMBULATORY_CARE_PROVIDER_SITE_OTHER): Payer: 59 | Admitting: Neurology

## 2016-01-03 ENCOUNTER — Encounter: Payer: Self-pay | Admitting: Neurology

## 2016-01-03 VITALS — BP 106/80 | HR 80 | Resp 20 | Ht 72.0 in | Wt 225.0 lb

## 2016-01-03 DIAGNOSIS — R0683 Snoring: Secondary | ICD-10-CM | POA: Diagnosis not present

## 2016-01-03 DIAGNOSIS — G4752 REM sleep behavior disorder: Secondary | ICD-10-CM

## 2016-01-03 NOTE — Progress Notes (Signed)
SLEEP MEDICINE CLINIC   Provider:  Melvyn Novasarmen  Jennalee Greaves, M D  Referring Provider: Lucky CowboyMcKeown, William, MD Primary Care Physician:  Nadean CorwinMCKEOWN,WILLIAM DAVID, MD  Chief Complaint  Patient presents with  . New Patient (Initial Visit)    snores sometimes, not all the time, has had sleep study a long time ago, tried cpap in the past    HPI:  Wayne [name] is a 65 y.o. male , seen here as a referral  from Dr. Oneta RackMcKeown office by Terri Piedraourtney Forcucci, PA for a sleep evaluation.   Chief complaint according to patient : "no problem to sleep, I can go to sleep anytime, anywhere"  "If I am very tired, I wake up suddently and feel short of breath, gasping".   The patient works between 6 AM and 6 PM, his hours fluctuates with in that frame. 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 He has been in this field for 14 years.  He has been diagnosed with a vitamin D deficiency which may contribute to fatigue, he has been recommended to take 2000 units of vitamin D supplement a day, he has been diagnosed with diabetes without any complications at this time there is neither nephropathy, neuropathy no retinopathy noted. He continues to watch his diet and exercise. The same is true for his elevated cholesterol, and his hypertension. He has implemented the DASH diet. He has not experienced severe vertigo nausea, dizziness, severe headaches, changes in vision or speech, body numbness, weakness, or confusion. He noted delayed memory function, coming up with a name, a work in [employer]sentence.    Sleep habits are as follows:  Exercise he does some walking in the afternoons, but usually is in bed by about 9 PM, he has to rise early at about 5 AM. He usually falls asleep immediately, and sleeps soundly. Sometimes his sleep is interrupted not by his own needs but by his dogs.The bedroom is described as cool, quiet and dark. He shares a bed with his wife, and has light blocking  window treatments. 2-3  bathroom breaks.  The first one at or after midnight. He reports vivid dreams, sometimes he yells or thrashes.  His wife has at times expelled him from the bedroom to the sofa. He sleeps on his side, on 2 pillows. He wakes up spontaneously without alarm. He may wake with a dry mouth but usually does not suffer any headaches in the morning. He feels usually restored and refreshed in the morning.  Sleep medical history and family sleep history: No family history of sleep apnea, no night terrors or sleepwalking in childhood. No history of head or neck trauma, surgeries- had tonsillectomy a age 58  , his only ENT procedures.  Social history:  Married, no children, 2 boxers. He quit smoking in the year 2000, he occasionally drinks alcohol but not more than 5 standard drinks a week, caffeine use; every day he will have one or 2 cups of coffee in the morning, sometimes a coffee in the afternoon. He drinks iced tea for dinner, he does drink 1-2 sodas.  Review of Systems: Out of a complete 14 system review, the patient complains of only the following symptoms, and all other reviewed systems are negative. Snoring, REM BD, Nocturia.  How likely are you to doze in the following situations: 0 = not likely, 1 = slight chance, 2 = moderate chance, 3 = high chance  Sitting and Reading? 3 Watching Television? 3  Sitting inactive in a public place (theater or meeting)? 2 Lying down in the afternoon when circumstances permit?3 Sitting and talking to someone?0 Sitting quietly after lunch without alcohol?1 In a car, while stopped for a few minutes in traffic?0 As a passenger in a car for an hour without a break? 1  Total = 15     , Fatigue severity score 22 , depression score 1/15    Social History   Social History  . Marital status: Married    Spouse name: N/A  . Number of children: N/A  . Years of education: N/A   Occupational History  . Not on file.   Social History Main  Topics  . Smoking status: Former Smoker    Quit date: 09/06/2007  . Smokeless tobacco: Not on file  . Alcohol use Yes     Comment: occasional  . Drug use: No  . Sexual activity: Not on file   Other Topics Concern  . Not on file   Social History Narrative  . No narrative on file    Family History  Problem Relation Age of Onset  . Stroke    . Hypertension    . Hyperlipidemia    . Diabetes      Past Medical History:  Diagnosis Date  . Anxiety    On Xanax  . Chest pain   . Fatigue   . GERD (gastroesophageal reflux disease)   . HTN (hypertension)   . Hyperlipemia   . IBS (irritable bowel syndrome)   . OSA (obstructive sleep apnea)    Restated CPAP  . Type II or unspecified type diabetes mellitus without mention of complication, not stated as uncontrolled   . Vitamin D deficiency     Past Surgical History:  Procedure Laterality Date  . WISDOM TOOTH EXTRACTION      Current Outpatient Prescriptions  Medication Sig Dispense Refill  . ALPRAZolam (XANAX) 1 MG tablet Take 1 tablet (1 mg total) by mouth at bedtime as needed. 90 tablet 0  . aspirin 81 MG tablet Take 81 mg by mouth daily.    Marland Kitchen. atenolol (TENORMIN) 50 MG tablet Take 2 tablets daily for BP. 180 tablet 0  . Cholecalciferol (VITAMIN D3) 2000 UNITS capsule Take 2,000 Units by mouth daily. Takes 6000 units daily    . citalopram (CELEXA) 40 MG tablet TAKE 1 TABLET BY MOUTH EVERY DAY 90 tablet 0  . hyoscyamine (LEVBID) 0.375 MG 12 hr tablet Take 1 -2 tabs Twice Daily prn 180 tablet 0  . losartan-hydrochlorothiazide (HYZAAR) 100-25 MG tablet Take 1 tablet by mouth daily. 90 tablet 0  . Magnesium 400 MG CAPS Take by mouth 2 (two) times daily.     . metFORMIN (GLUCOPHAGE-XR) 500 MG 24 hr tablet TAKE 4 TABLETS BY MOUTH DAILY AS DIRECTED 360 tablet 0  . Probiotic Product (PROBIOTIC DAILY PO) Take by mouth daily.    . rosuvastatin (CRESTOR) 10 MG tablet TAKE 1 TABLET BY MOUTH EVERY DAY 90 tablet 0   No current  facility-administered medications for this visit.     Allergies as of 01/03/2016  . (No Known Allergies)    Vitals: BP 106/80   Pulse 80   Resp 20   Ht 6' (1.829 m)   Wt 225 lb (102.1 kg)   BMI 30.52 kg/m  Last Weight:  Wt Readings from Last 1 Encounters:  01/03/16 225 lb (102.1 kg)   ONG:EXBMBMI:Body mass index is 30.52 kg/m.     Last Height:  Ht Readings from Last 1 Encounters:  01/03/16 6' (1.829 m)    Physical exam:  General: The patient is awake, alert and appears not in acute distress. The patient is well groomed. Head: Normocephalic, atraumatic. Neck is supple. Mallampati 4,  neck circumference:17. Nasal airflow patent  Retrognathia is not seen.  Cardiovascular:  Regular rate and rhythm, without  murmurs or carotid bruit, and without distended neck veins. Respiratory: Lungs are clear to auscultation. Skin:  Without evidence of edema, or rash Trunk: BMI is elevated  The patient's posture is   Neurologic exam : The patient is awake and alert, oriented to place and time.   Memory subjective described as intact. Some delayed word-finding only recently  Attention span & concentration ability appears normal.  Speech is fluent,  without dysarthria, dysphonia or aphasia.  Mood and affect are appropriate.  Cranial nerves: Pupils are equal and briskly reactive to light.  Extraocular movements  in vertical and horizontal planes intact and without nystagmus. Visual fields by finger perimetry are intact. Hearing to finger rub intact.   Facial sensation intact to fine touch.  Facial motor strength is symmetric and tongue and uvula move midline. Shoulder shrug was symmetrical.   Motor exam: Normal tone, muscle bulk and symmetric strength in all extremities.  Sensory:  Fine touch, pinprick and vibration were tested in all extremities. Proprioception tested in the upper extremities was normal.  Coordination: Rapid alternating movements in the fingers/hands and Finger-to-nose  maneuver  normal without evidence of ataxia, dysmetria or tremor.  Gait and station: Patient walks without assistive device and is able unassisted to climb up to the exam table. Strength within normal limits.  Stance is stable and normal.   Deep tendon reflexes: in the  upper and lower extremities are symmetric and intact.  The patient was advised of the nature of the diagnosed sleep disorder , the treatment options and risks for general a health and wellness arising from not treating the condition.  I spent more than 45 minutes of face to face time with the patient. Greater than 50% of time was spent in counseling and coordination of care. We have discussed the diagnosis and differential and I answered the patient's questions.     Assessment:  After physical and neurologic examination, review of laboratory studies,  Personal review of imaging studies, reports of other /same  Imaging studies ,  Results of polysomnography/ neurophysiology testing and pre-existing records as far as provided in visit., my assessment is   1) I suspect that Mr. Whitsitt will be a supine positional dependent snorer, and may have some apneas in supine.  He does not have headaches, extreme fatigue, but  extreme sleepiness. No capnography is needed.  His wife has noted him to act out dreams at times. I will order a REM behavior montage polysomnography.   Plan:  Treatment plan and additional workup : REM montage PSG, if apnea is found will autotitrate.      Porfirio Mylar Emireth Cockerham MD  01/03/2016   CC: Lucky Cowboy, Md [address] [place], [place] [address]

## 2016-01-07 ENCOUNTER — Other Ambulatory Visit: Payer: Self-pay | Admitting: Internal Medicine

## 2016-01-07 DIAGNOSIS — E119 Type 2 diabetes mellitus without complications: Secondary | ICD-10-CM

## 2016-01-25 ENCOUNTER — Telehealth: Payer: Self-pay | Admitting: Neurology

## 2016-01-25 ENCOUNTER — Other Ambulatory Visit: Payer: Self-pay | Admitting: Internal Medicine

## 2016-01-25 DIAGNOSIS — G4754 Parasomnia in conditions classified elsewhere: Secondary | ICD-10-CM

## 2016-01-25 NOTE — Telephone Encounter (Signed)
[employer] denied NPSG.  °

## 2016-02-02 ENCOUNTER — Other Ambulatory Visit: Payer: Self-pay | Admitting: Internal Medicine

## 2016-02-02 DIAGNOSIS — I10 Essential (primary) hypertension: Secondary | ICD-10-CM

## 2016-02-28 ENCOUNTER — Telehealth: Payer: Self-pay | Admitting: Neurology

## 2016-02-28 ENCOUNTER — Encounter: Payer: Self-pay | Admitting: Internal Medicine

## 2016-02-28 DIAGNOSIS — G4733 Obstructive sleep apnea (adult) (pediatric): Secondary | ICD-10-CM

## 2016-02-28 NOTE — Telephone Encounter (Signed)
[employer] denied NPSG.  °

## 2016-02-29 NOTE — Telephone Encounter (Signed)
Ordered HST for this patient, [employer] denies PSG.

## 2016-03-03 ENCOUNTER — Encounter: Payer: Self-pay | Admitting: Internal Medicine

## 2016-03-03 ENCOUNTER — Ambulatory Visit (INDEPENDENT_AMBULATORY_CARE_PROVIDER_SITE_OTHER): Payer: 59 | Admitting: Internal Medicine

## 2016-03-03 VITALS — BP 112/64 | HR 66 | Temp 98.2°F | Resp 16 | Ht 72.0 in | Wt 229.0 lb

## 2016-03-03 DIAGNOSIS — E119 Type 2 diabetes mellitus without complications: Secondary | ICD-10-CM

## 2016-03-03 DIAGNOSIS — Z23 Encounter for immunization: Secondary | ICD-10-CM

## 2016-03-03 DIAGNOSIS — I10 Essential (primary) hypertension: Secondary | ICD-10-CM

## 2016-03-03 DIAGNOSIS — Z79899 Other long term (current) drug therapy: Secondary | ICD-10-CM | POA: Diagnosis not present

## 2016-03-03 DIAGNOSIS — E349 Endocrine disorder, unspecified: Secondary | ICD-10-CM

## 2016-03-03 DIAGNOSIS — E782 Mixed hyperlipidemia: Secondary | ICD-10-CM

## 2016-03-03 DIAGNOSIS — E559 Vitamin D deficiency, unspecified: Secondary | ICD-10-CM | POA: Diagnosis not present

## 2016-03-03 LAB — CBC WITH DIFFERENTIAL/PLATELET
BASOS ABS: 59 {cells}/uL (ref 0–200)
Basophils Relative: 1 %
EOS ABS: 295 {cells}/uL (ref 15–500)
Eosinophils Relative: 5 %
HCT: 41.2 % (ref 38.5–50.0)
Hemoglobin: 13.4 g/dL (ref 13.2–17.1)
LYMPHS PCT: 55 %
Lymphs Abs: 3245 cells/uL (ref 850–3900)
MCH: 30.5 pg (ref 27.0–33.0)
MCHC: 32.5 g/dL (ref 32.0–36.0)
MCV: 93.8 fL (ref 80.0–100.0)
MONOS PCT: 6 %
MPV: 9.5 fL (ref 7.5–12.5)
Monocytes Absolute: 354 cells/uL (ref 200–950)
NEUTROS PCT: 33 %
Neutro Abs: 1947 cells/uL (ref 1500–7800)
PLATELETS: 315 10*3/uL (ref 140–400)
RBC: 4.39 MIL/uL (ref 4.20–5.80)
RDW: 13.1 % (ref 11.0–15.0)
WBC: 5.9 10*3/uL (ref 3.8–10.8)

## 2016-03-03 LAB — BASIC METABOLIC PANEL WITH GFR
BUN: 17 mg/dL (ref 7–25)
CO2: 29 mmol/L (ref 20–31)
CREATININE: 0.94 mg/dL (ref 0.70–1.25)
Calcium: 10 mg/dL (ref 8.6–10.3)
Chloride: 104 mmol/L (ref 98–110)
GFR, Est Non African American: 86 mL/min (ref >=60)
Glucose, Bld: 120 mg/dL — ABNORMAL HIGH (ref 65–99)
Potassium: 5 mmol/L (ref 3.5–5.3)
Sodium: 142 mmol/L (ref 135–146)

## 2016-03-03 LAB — HEPATIC FUNCTION PANEL
ALBUMIN: 4.2 g/dL (ref 3.6–5.1)
ALT: 13 U/L (ref 9–46)
AST: 18 U/L (ref 10–35)
Alkaline Phosphatase: 51 U/L (ref 40–115)
BILIRUBIN TOTAL: 0.4 mg/dL (ref 0.2–1.2)
Bilirubin, Direct: 0.1 mg/dL (ref <=0.2)
Indirect Bilirubin: 0.3 mg/dL (ref 0.2–1.2)
Total Protein: 7.5 g/dL (ref 6.1–8.1)

## 2016-03-03 LAB — LIPID PANEL
CHOL/HDL RATIO: 3.3 ratio (ref <=5.0)
CHOLESTEROL: 140 mg/dL (ref 125–200)
HDL: 42 mg/dL (ref >=40)
LDL Cholesterol: 81 mg/dL (ref <130)
Triglycerides: 83 mg/dL (ref <150)
VLDL: 17 mg/dL (ref <30)

## 2016-03-03 LAB — HEMOGLOBIN A1C
Hgb A1c MFr Bld: 6.3 % — ABNORMAL HIGH (ref <5.7)
Mean Plasma Glucose: 134 mg/dL

## 2016-03-03 LAB — TSH: TSH: 1.4 mIU/L (ref 0.40–4.50)

## 2016-03-03 NOTE — Patient Instructions (Signed)
Please try eating either a cracker with some peanut butter or apple slices with either cheese, cream cheese or peanut butter prior to bedtime to help you maintain normal blood sugars over night.  Please try using 2 sprays per nostril of flonase (Allerflo at [facility]) right before bedtime.  You can use this only during peak allergy seasons.

## 2016-03-03 NOTE — Progress Notes (Signed)
Assessment and Plan:  Hypertension:  -Continue medication -monitor blood pressure at home. -Continue DASH diet -Reminder to go to the ER if any CP, SOB, nausea, dizziness, severe HA, changes vision/speech, left arm numbness and tingling and jaw pain.  Cholesterol - Continue diet and exercise -Check cholesterol.   Diabetes without complications  -recommended checking CBGs if he wakes up during the night as there may be possible overnight hypoglycemia episodes. -Continue diet and exercise.  -Check A1C  Vitamin D Def -check level -continue medications.   Need for flu shot -given in office today  Morbid obesity -recommended continued exercise and diet management. -may be a good candidate for phentermine  Testosterone Def -cont testosterone supplement  Continue diet and meds as discussed. Further disposition pending results of labs. Discussed med's effects and SE's.    HPI [age] y.o. male  presents for 3 month follow up with hypertension, hyperlipidemia, diabetes and vitamin D deficiency.   His blood pressure has been controlled at home, today their BP is BP: 112/64.He does workout. He denies chest pain, shortness of breath, dizziness. He reports that his blood pressure is doing well.  He does not check it currently.  He is not having any issues with getting his atenolol    He is on cholesterol medication and denies myalgias. His cholesterol is at goal. The cholesterol was:  11/26/2015: Cholesterol 121; HDL 35; LDL Cholesterol 68; Triglycerides 88   He has been working on diet and exercise for diabetes without complications, he is on bASA, he is on ACE/ARB, and denies  foot ulcerations, hyperglycemia, hypoglycemia , increased appetite, nausea, paresthesia of the feet, polydipsia, polyuria, visual disturbances, vomiting and weight loss. Last A1C was: 11/26/2015: Hgb A1c MFr Bld 6.3.  He reports that he has not been checking them regularly.  He reports that he is having some cold sweats.   He reports that he does have some nightmares at nighttime.  He reports that he does have some wetness on his pillow.     Patient is on Vitamin D supplement. 08/20/2015: Vit D, 25-Hydroxy 40  He has no other new complaints today.      Current Medications:  Current Outpatient Prescriptions on File Prior to Visit  Medication Sig Dispense Refill  . ALPRAZolam (XANAX) 1 MG tablet Take 1 tablet (1 mg total) by mouth at bedtime as needed. 90 tablet 0  . aspirin 81 MG tablet Take 81 mg by mouth daily.    Marland Kitchen. atenolol (TENORMIN) 50 MG tablet TAKE 2 TABLETS DAILY FOR BLOOD PRESSURE 180 tablet 0  . Cholecalciferol (VITAMIN D3) 2000 UNITS capsule Take 2,000 Units by mouth daily. Takes 6000 units daily    . citalopram (CELEXA) 40 MG tablet TAKE 1 TABLET DAILY 90 tablet 0  . hyoscyamine (LEVBID) 0.375 MG 12 hr tablet Take 1 -2 tabs Twice Daily prn 180 tablet 0  . losartan-hydrochlorothiazide (HYZAAR) 100-25 MG tablet TAKE 1 TABLET DAILY 90 tablet 0  . Magnesium 400 MG CAPS Take by mouth 2 (two) times daily.     . metFORMIN (GLUCOPHAGE-XR) 500 MG 24 hr tablet TAKE 4 TABLETS DAILY AS DIRECTED 360 tablet 0  . Probiotic Product (PROBIOTIC DAILY PO) Take by mouth daily.    . rosuvastatin (CRESTOR) 10 MG tablet TAKE 1 TABLET DAILY 90 tablet 0   No current facility-administered medications on file prior to visit.    Medical History:  Past Medical History:  Diagnosis Date  . Anxiety    On Xanax  .  Chest pain   . Fatigue   . GERD (gastroesophageal reflux disease)   . HTN (hypertension)   . Hyperlipemia   . IBS (irritable bowel syndrome)   . OSA (obstructive sleep apnea)    Restated CPAP  . Type II or unspecified type diabetes mellitus without mention of complication, not stated as uncontrolled   . Vitamin D deficiency    Allergies: No Known Allergies   Review of Systems:  Review of Systems  Constitutional: Negative for chills, fever and malaise/fatigue.  HENT: Negative for congestion, ear pain  and sore throat.   Eyes: Negative.   Respiratory: Negative for cough, shortness of breath and wheezing.   Cardiovascular: Negative for chest pain, palpitations and leg swelling.  Gastrointestinal: Negative for abdominal pain, blood in stool, constipation, diarrhea, heartburn and melena.  Genitourinary: Negative.   Skin: Negative.   Neurological: Negative for dizziness, sensory change, loss of consciousness and headaches.  Psychiatric/Behavioral: Negative for depression. The patient is not nervous/anxious and does not have insomnia.     Family history- Review and unchanged  Social history- Review and unchanged  Physical Exam: BP 112/64   Pulse 66   Temp 98.2 F (36.8 C) (Temporal)   Resp 16   Ht 6' (1.829 m)   Wt 229 lb (103.9 kg)   BMI 31.06 kg/m  Wt Readings from Last 3 Encounters:  03/03/16 229 lb (103.9 kg)  01/03/16 225 lb (102.1 kg)  11/26/15 227 lb (103 kg)   General Appearance: Well nourished well developed, non-toxic appearing, in no apparent distress. Eyes: PERRLA, EOMs, conjunctiva no swelling or erythema ENT/Mouth: Ear canals clear with no erythema, swelling, or discharge.  TMs normal bilaterally, oropharynx clear, moist, with no exudate.   Neck: Supple, thyroid normal, no JVD, no cervical adenopathy.  Respiratory: Respiratory effort normal, breath sounds clear A&P, no wheeze, rhonchi or rales noted.  No retractions, no accessory muscle usage Cardio: RRR with no MRGs. No noted edema.  Abdomen: Soft, + BS.  Non tender, no guarding, rebound, hernias, masses. Musculoskeletal: Full ROM, 5/5 strength, Normal gait Skin: Warm, dry without rashes, lesions, ecchymosis.  Neuro: Awake and oriented X 3, Cranial nerves intact. No cerebellar symptoms.  Psych: normal affect, Insight and Judgment appropriate.    Terri Piedra, PA-C 10:29 AM [facility]

## 2016-04-06 ENCOUNTER — Other Ambulatory Visit: Payer: Self-pay | Admitting: Internal Medicine

## 2016-04-06 DIAGNOSIS — E119 Type 2 diabetes mellitus without complications: Secondary | ICD-10-CM

## 2016-04-24 ENCOUNTER — Other Ambulatory Visit: Payer: Self-pay | Admitting: Physician Assistant

## 2016-05-02 ENCOUNTER — Other Ambulatory Visit: Payer: Self-pay | Admitting: Physician Assistant

## 2016-05-02 DIAGNOSIS — I10 Essential (primary) hypertension: Secondary | ICD-10-CM

## 2016-05-24 ENCOUNTER — Other Ambulatory Visit: Payer: Self-pay | Admitting: Internal Medicine

## 2016-05-24 DIAGNOSIS — I10 Essential (primary) hypertension: Secondary | ICD-10-CM

## 2016-06-09 ENCOUNTER — Ambulatory Visit: Payer: Self-pay | Admitting: Internal Medicine

## 2016-07-10 ENCOUNTER — Ambulatory Visit (INDEPENDENT_AMBULATORY_CARE_PROVIDER_SITE_OTHER): Payer: Managed Care, Other (non HMO) | Admitting: Internal Medicine

## 2016-07-10 ENCOUNTER — Encounter: Payer: Self-pay | Admitting: Internal Medicine

## 2016-07-10 VITALS — BP 122/80 | HR 66 | Temp 98.1°F | Resp 16 | Ht 72.0 in | Wt 230.0 lb

## 2016-07-10 DIAGNOSIS — I10 Essential (primary) hypertension: Secondary | ICD-10-CM

## 2016-07-10 DIAGNOSIS — G4733 Obstructive sleep apnea (adult) (pediatric): Secondary | ICD-10-CM

## 2016-07-10 DIAGNOSIS — E119 Type 2 diabetes mellitus without complications: Secondary | ICD-10-CM

## 2016-07-10 DIAGNOSIS — E559 Vitamin D deficiency, unspecified: Secondary | ICD-10-CM

## 2016-07-10 DIAGNOSIS — Z79899 Other long term (current) drug therapy: Secondary | ICD-10-CM | POA: Diagnosis not present

## 2016-07-10 DIAGNOSIS — E782 Mixed hyperlipidemia: Secondary | ICD-10-CM | POA: Diagnosis not present

## 2016-07-10 LAB — CBC WITH DIFFERENTIAL/PLATELET
BASOS PCT: 1 %
Basophils Absolute: 67 cells/uL (ref 0–200)
EOS ABS: 469 {cells}/uL (ref 15–500)
Eosinophils Relative: 7 %
HCT: 38.2 % — ABNORMAL LOW (ref 38.5–50.0)
HEMOGLOBIN: 12.7 g/dL — AB (ref 13.2–17.1)
LYMPHS ABS: 3350 {cells}/uL (ref 850–3900)
LYMPHS PCT: 50 %
MCH: 30.7 pg (ref 27.0–33.0)
MCHC: 33.2 g/dL (ref 32.0–36.0)
MCV: 92.3 fL (ref 80.0–100.0)
MONO ABS: 670 {cells}/uL (ref 200–950)
MPV: 10.1 fL (ref 7.5–12.5)
Monocytes Relative: 10 %
NEUTROS ABS: 2144 {cells}/uL (ref 1500–7800)
Neutrophils Relative %: 32 %
Platelets: 278 10*3/uL (ref 140–400)
RBC: 4.14 MIL/uL — ABNORMAL LOW (ref 4.20–5.80)
RDW: 13.4 % (ref 11.0–15.0)
WBC: 6.7 10*3/uL (ref 3.8–10.8)

## 2016-07-10 MED ORDER — METFORMIN HCL ER 500 MG PO TB24: ORAL_TABLET | ORAL | 0 refills | Status: DC

## 2016-07-10 MED ORDER — ATENOLOL 100 MG PO TABS: ORAL_TABLET | ORAL | 0 refills | Status: DC

## 2016-07-10 NOTE — Patient Instructions (Signed)

## 2016-07-10 NOTE — Progress Notes (Signed)
[facility] Wayne [name], M.D.        Dyanne CarrelAmanda R. Steffanie Dunnollier, P.A.-C       Terri Piedraourtney Forcucci, P.A.-C  [facility]                [address]                [place], [place] [id number] Telephone [phone] Telefax [phone] ______________________________________________________________________     This very nice 66 y.o. MBM presents for overdue follow up with Hypertension, Hyperlipidemia, T2_NIDDM and Vitamin D Deficiency.      Patient is treated for HTN (1989) & BP has been controlled at home. Today's BP is at goal - 122/80. Patient has had no complaints of any cardiac type chest pain, palpitations, dyspnea/orthopnea/PND, dizziness, claudication, or dependent edema.     Hyperlipidemia is controlled with diet & meds. Patient denies myalgias or other med SE's. Last Lipids were at goal: Lab Results  Component Value Date   CHOL 140 03/03/2016   HDL 42 03/03/2016   LDLCALC 81 03/03/2016   TRIG 83 03/03/2016   CHOLHDL 3.3 03/03/2016      Also, the patient has history of T2_NIDDM (2007) with CKD2 (GFR 86 ml/min)  and has had no symptoms of reactive hypoglycemia, diabetic polys, paresthesias or visual blurring. Patient does not monitor CBG's and in Sept 2015 his A1c was 7.3% and last A1c was not at goal: Lab Results  Component Value Date   HGBA1C 6.3 (H) 03/03/2016      Further, the patient also has history of Vitamin D Deficiency ("22" in 2008) and supplements vitamin D without any suspected side-effects. Last vitamin D was not at goal: Lab Results  Component Value Date   VD25OH 40 08/20/2015   Current Outpatient Prescriptions on File Prior to Visit  Medication Sig  . ALPRAZolam  1 MG tablet Take 1 tab at bedtime as needed.  Marland Kitchen. aspirin 81 MG tablet Take daily.  Marland Kitchen. VITAMIN D 2000 UNITS  Takes 6000 units daily  . citalopram  40 MG tablet TAKE 1 TABLET DAILY  . hyoscyamine  0.375 MG 12 hr tab Take 1 -2 tabs Twice Daily  prn  . losartan-hctz 100-25 MG tablet TAKE 1 TABLET DAILY  . Magnesium 400 MG CAPS Take by mouth 2 (two) times daily.   Marland Kitchen. PROBIOTIC  Take by mouth daily.  . rosuvastatin  10 MG tablet TAKE 1 TABLET DAILY   No Known Allergies   PMHx:   Past Medical History:  Diagnosis Date  . Anxiety    On Xanax  . Chest pain   . Fatigue   . GERD (gastroesophageal reflux disease)   . HTN (hypertension)   . Hyperlipemia   . IBS (irritable bowel syndrome)   . OSA (obstructive sleep apnea)    Restated CPAP  . Type II or unspecified type diabetes mellitus without mention of complication, not stated as uncontrolled   . Vitamin D deficiency    Immunization History  Administered Date(s) Administered  . Influenza Split 03/01/2015  . Influenza,inj,quad, With Preservative 03/03/2016  . PPD Test 01/30/2014  . Pneumococcal Polysaccharide-23 05/21/2008  . Td 10/30/2003  . Tdap 01/30/2014   Past Surgical History:  Procedure Laterality Date  . WISDOM TOOTH EXTRACTION     FHx:    Reviewed / unchanged  SHx:    Reviewed / unchanged  Systems Review:  Constitutional: Denies fever, chills, wt changes, headaches, insomnia, fatigue, night sweats, change  in appetite. Eyes: Denies redness, blurred vision, diplopia, discharge, itchy, watery eyes.  ENT: Denies discharge, congestion, post nasal drip, epistaxis, sore throat, earache, hearing loss, dental pain, tinnitus, vertigo, sinus pain, snoring.  CV: Denies chest pain, palpitations, irregular heartbeat, syncope, dyspnea, diaphoresis, orthopnea, PND, claudication or edema. Respiratory: denies cough, dyspnea, DOE, pleurisy, hoarseness, laryngitis, wheezing.  Gastrointestinal: Denies dysphagia, odynophagia, heartburn, reflux, water brash, abdominal pain or cramps, nausea, vomiting, bloating, diarrhea, constipation, hematemesis, melena, hematochezia  or hemorrhoids. Genitourinary: Denies dysuria, frequency, urgency, nocturia, hesitancy, discharge, hematuria or  flank pain. Musculoskeletal: Denies arthralgias, myalgias, stiffness, jt. swelling, pain, limping or strain/sprain.  Skin: Denies pruritus, rash, hives, warts, acne, eczema or change in skin lesion(s). Neuro: No weakness, tremor, incoordination, spasms, paresthesia or pain. Psychiatric: Denies confusion, memory loss or sensory loss. Endo: Denies change in weight, skin or hair change.  Heme/Lymph: No excessive bleeding, bruising or enlarged lymph nodes.  Physical Exam  BP 122/80   Pulse 66   Temp 98.1 F (36.7 C)   Resp 16   Ht 6' (1.829 m)   Wt 230 lb (104.3 kg)   BMI 31.19 kg/m   Appears well nourished and in no distress.  Eyes: PERRLA, EOMs, conjunctiva no swelling or erythema. Sinuses: No frontal/maxillary tenderness ENT/Mouth: EAC's clear, TM's nl w/o erythema, bulging. Nares clear w/o erythema, swelling, exudates. Oropharynx clear without erythema or exudates. Oral hygiene is good. Tongue normal, non obstructing. Hearing intact.  Neck: Supple. Thyroid nl. Car 2+/2+ without bruits, nodes or JVD. Chest: Respirations nl with BS clear & equal w/o rales, rhonchi, wheezing or stridor.  Cor: Heart sounds normal w/ regular rate and rhythm without sig. murmurs, gallops, clicks, or rubs. Peripheral pulses normal and equal  without edema.  Abdomen: Soft & bowel sounds normal. Non-tender w/o guarding, rebound, hernias, masses, or organomegaly.  Lymphatics: Unremarkable.  Musculoskeletal: Full ROM all peripheral extremities, joint stability, 5/5 strength, and normal gait.  Skin: Warm, dry without exposed rashes, lesions or ecchymosis apparent.  Neuro: Cranial nerves intact, reflexes equal bilaterally. Sensory-motor testing grossly intact. Tendon reflexes grossly intact.  Pysch: Alert & oriented x 3.  Insight and judgement nl & appropriate. No ideations.  Assessment and Plan:  - Continue medication, monitor blood pressure at home.  - Continue DASH diet. Reminder to go to the ER if any  CP,  SOB, nausea, dizziness, severe HA, changes vision/speech,  left arm numbness and tingling and jaw pain.  - Continue diet/meds, exercise,& lifestyle modifications.  - Continue monitor periodic cholesterol/liver & renal functions   - Continue diet, exercise, lifestyle modifications.  - Monitor appropriate labs. - Continue supplementation.      Recommended regular exercise, BP monitoring, weight control, and discussed med and SE's. Recommended labs to assess and monitor clinical status. Further disposition pending results of labs. Over 30 minutes of exam, counseling, chart review was performed

## 2016-07-11 LAB — MAGNESIUM: Magnesium: 1.7 mg/dL (ref 1.5–2.5)

## 2016-07-11 LAB — BASIC METABOLIC PANEL WITH GFR
BUN: 20 mg/dL (ref 7–25)
CHLORIDE: 103 mmol/L (ref 98–110)
CO2: 28 mmol/L (ref 20–31)
Calcium: 9.9 mg/dL (ref 8.6–10.3)
Creat: 0.84 mg/dL (ref 0.70–1.25)
GFR, Est African American: >89 mL/min (ref >=60)
Glucose, Bld: 81 mg/dL (ref 65–99)
POTASSIUM: 4.3 mmol/L (ref 3.5–5.3)
Sodium: 138 mmol/L (ref 135–146)

## 2016-07-11 LAB — HEPATIC FUNCTION PANEL
ALBUMIN: 3.9 g/dL (ref 3.6–5.1)
ALK PHOS: 54 U/L (ref 40–115)
ALT: 19 U/L (ref 9–46)
AST: 20 U/L (ref 10–35)
BILIRUBIN INDIRECT: 0.1 mg/dL — AB (ref 0.2–1.2)
Bilirubin, Direct: 0.1 mg/dL (ref <=0.2)
TOTAL PROTEIN: 6.8 g/dL (ref 6.1–8.1)
Total Bilirubin: 0.2 mg/dL (ref 0.2–1.2)

## 2016-07-11 LAB — LIPID PANEL
Cholesterol: 146 mg/dL (ref <200)
HDL: 35 mg/dL — ABNORMAL LOW (ref >40)
LDL CALC: 82 mg/dL (ref <100)
Total CHOL/HDL Ratio: 4.2 Ratio (ref <5.0)
Triglycerides: 147 mg/dL (ref <150)
VLDL: 29 mg/dL (ref <30)

## 2016-07-11 LAB — HEMOGLOBIN A1C
Hgb A1c MFr Bld: 6.6 % — ABNORMAL HIGH (ref <5.7)
MEAN PLASMA GLUCOSE: 143 mg/dL

## 2016-07-11 LAB — VITAMIN D 25 HYDROXY (VIT D DEFICIENCY, FRACTURES): Vit D, 25-Hydroxy: 37 ng/mL (ref 30–100)

## 2016-07-11 LAB — INSULIN, RANDOM: INSULIN: 40.1 u[IU]/mL — AB (ref 2.0–19.6)

## 2016-07-11 LAB — TSH: TSH: 3.11 m[IU]/L (ref 0.40–4.50)

## 2016-07-23 ENCOUNTER — Other Ambulatory Visit: Payer: Self-pay | Admitting: Internal Medicine

## 2016-10-11 ENCOUNTER — Other Ambulatory Visit: Payer: Self-pay | Admitting: *Deleted

## 2016-10-11 DIAGNOSIS — I10 Essential (primary) hypertension: Secondary | ICD-10-CM

## 2016-10-11 DIAGNOSIS — E119 Type 2 diabetes mellitus without complications: Secondary | ICD-10-CM

## 2016-10-11 MED ORDER — METFORMIN HCL ER 500 MG PO TB24: ORAL_TABLET | ORAL | 0 refills | Status: DC

## 2016-10-11 MED ORDER — ALPRAZOLAM 1 MG PO TABS: 1.0000 mg | ORAL_TABLET | Freq: Every evening | ORAL | 0 refills | Status: DC | PRN

## 2016-10-11 MED ORDER — ROSUVASTATIN CALCIUM 10 MG PO TABS: 10.0000 mg | ORAL_TABLET | Freq: Every day | ORAL | 0 refills | Status: DC

## 2016-10-11 MED ORDER — ATENOLOL 100 MG PO TABS: ORAL_TABLET | ORAL | 0 refills | Status: DC

## 2016-10-11 MED ORDER — CITALOPRAM HYDROBROMIDE 40 MG PO TABS: 40.0000 mg | ORAL_TABLET | Freq: Every day | ORAL | 0 refills | Status: DC

## 2016-10-11 MED ORDER — LOSARTAN POTASSIUM-HCTZ 100-25 MG PO TABS: 1.0000 | ORAL_TABLET | Freq: Every day | ORAL | 0 refills | Status: DC

## 2016-10-15 NOTE — Progress Notes (Signed)
Assessment and Plan:  Hypertension -Continue medication, if looks dehydrated then may need to decrease HCTZ  - monitor blood pressure at home. Continue DASH diet.  Reminder to go to the ER if any CP, SOB, nausea, dizziness, severe HA, changes vision/speech, left arm numbness and tingling and jaw pain.   Cholesterol -Continue diet and exercise. Check cholesterol.    Diabetes without complications -Continue diet and exercise. Check A1C Stop fruit juice   Vitamin D Def - check level and continue medications.    Obesity with co morbidities - long discussion about weight loss, diet, and exercise   Continue diet and meds as discussed. Further disposition pending results of labs. Discussed med's effects and SE's.   Over 30 minutes of exam, counseling, chart review, and critical decision making was performed   HPI 66 y.o. AA male  presents for 3 month follow up on hypertension, cholesterol, diabetes and vitamin D deficiency.  He has not had any medication for 2 weeks.   His blood pressure has been controlled at home, today his BP is BP: 124/88.  He does workout. He denies chest pain, shortness of breath, dizziness.  He is on cholesterol medication and denies myalgias. His cholesterol is at goal. The cholesterol was:   Lab Results  Component Value Date   CHOL 146 07/10/2016   HDL 35 (L) 07/10/2016   LDLCALC 82 07/10/2016   TRIG 147 07/10/2016   CHOLHDL 4.2 07/10/2016    He has been working on diet and exercise for diabetes without complications, checks sugars at home, has been 140-150, he is on bASA, he is on ACE/ARB, and denies  paresthesia of the feet, polydipsia, polyuria and visual disturbances. Last A1C was:  Lab Results  Component Value Date   HGBA1C 6.6 (H) 07/10/2016    Patient is on Vitamin D supplement. Lab Results  Component Value Date   VD25OH 37 07/10/2016   BMI is Body mass index is 30.92 kg/m., he is working on diet and exercise. He has OSA. Wt Readings from  Last 3 Encounters:  10/16/16 228 lb (103.4 kg)  07/10/16 230 lb (104.3 kg)  03/03/16 229 lb (103.9 kg)    Current Medications:  Current Outpatient Prescriptions on File Prior to Visit  Medication Sig Dispense Refill  . ALPRAZolam (XANAX) 1 MG tablet Take 1 tablet (1 mg total) by mouth at bedtime as needed. 90 tablet 0  . aspirin 81 MG tablet Take 81 mg by mouth daily.    . Magnesium 400 MG CAPS Take by mouth 2 (two) times daily.     . Probiotic Product (PROBIOTIC DAILY PO) Take by mouth daily.     No current facility-administered medications on file prior to visit.    Medical History:  Past Medical History:  Diagnosis Date  . Anxiety    On Xanax  . Chest pain   . Fatigue   . GERD (gastroesophageal reflux disease)   . HTN (hypertension)   . Hyperlipemia   . IBS (irritable bowel syndrome)   . OSA (obstructive sleep apnea)    Restated CPAP  . Type II or unspecified type diabetes mellitus without mention of complication, not stated as uncontrolled   . Vitamin D deficiency    Allergies: No Known Allergies   Review of Systems:  Review of Systems  Constitutional: Negative.  Negative for chills, fever and malaise/fatigue.  HENT: Negative.   Eyes: Negative.   Respiratory: Negative.   Cardiovascular: Negative.   Gastrointestinal: Negative.   Genitourinary:  Negative.   Musculoskeletal: Negative.   Skin: Negative.   Neurological: Negative.   Endo/Heme/Allergies: Negative.   Psychiatric/Behavioral: Negative.     Family history- Review and unchanged Social history- Review and unchanged Physical Exam: BP 124/88   Pulse 84   Temp 97.5 F (36.4 C)   Resp 18   Ht 6' (1.829 m)   Wt 228 lb (103.4 kg)   SpO2 98%   BMI 30.92 kg/m  Wt Readings from Last 3 Encounters:  10/16/16 228 lb (103.4 kg)  07/10/16 230 lb (104.3 kg)  03/03/16 229 lb (103.9 kg)   General Appearance: Well nourished, in no apparent distress. Eyes: PERRLA, EOMs, conjunctiva no swelling or  erythema Sinuses: No Frontal/maxillary tenderness ENT/Mouth: Ext aud canals clear, TMs without erythema, bulging. No erythema, swelling, or exudate on post pharynx.  Tonsils not swollen or erythematous. Hearing normal.  Neck: Supple, thyroid normal.  Respiratory: Respiratory effort normal, BS equal bilaterally without rales, rhonchi, wheezing or stridor.  Cardio: RRR with no MRGs. Brisk peripheral pulses without edema.  Abdomen: Soft, + BS.  Non tender, no guarding, rebound, hernias, masses. Lymphatics: Non tender without lymphadenopathy.  Musculoskeletal: Full ROM, 5/5 strength, Normal gait Skin: Warm, dry without rashes, lesions, ecchymosis.  Neuro: Cranial nerves intact. No cerebellar symptoms.  Psych: Awake and oriented X 3, normal affect, Insight and Judgment appropriate.    Quentin Mulling, PA-C 4:56 PM [facility]

## 2016-10-16 ENCOUNTER — Ambulatory Visit (INDEPENDENT_AMBULATORY_CARE_PROVIDER_SITE_OTHER): Payer: Managed Care, Other (non HMO) | Admitting: Physician Assistant

## 2016-10-16 ENCOUNTER — Encounter: Payer: Self-pay | Admitting: Physician Assistant

## 2016-10-16 ENCOUNTER — Other Ambulatory Visit: Payer: Self-pay

## 2016-10-16 VITALS — BP 124/88 | HR 84 | Temp 97.5°F | Resp 18 | Ht 72.0 in | Wt 228.0 lb

## 2016-10-16 DIAGNOSIS — E119 Type 2 diabetes mellitus without complications: Secondary | ICD-10-CM

## 2016-10-16 DIAGNOSIS — Z79899 Other long term (current) drug therapy: Secondary | ICD-10-CM | POA: Diagnosis not present

## 2016-10-16 DIAGNOSIS — E559 Vitamin D deficiency, unspecified: Secondary | ICD-10-CM | POA: Diagnosis not present

## 2016-10-16 DIAGNOSIS — I10 Essential (primary) hypertension: Secondary | ICD-10-CM

## 2016-10-16 DIAGNOSIS — E782 Mixed hyperlipidemia: Secondary | ICD-10-CM

## 2016-10-16 LAB — CBC WITH DIFFERENTIAL/PLATELET
Basophils Absolute: 74 cells/uL (ref 0–200)
Basophils Relative: 1 %
EOS ABS: 296 {cells}/uL (ref 15–500)
EOS PCT: 4 %
HCT: 40.8 % (ref 38.5–50.0)
Hemoglobin: 13.2 g/dL (ref 13.2–17.1)
Lymphocytes Relative: 57 %
Lymphs Abs: 4218 cells/uL — ABNORMAL HIGH (ref 850–3900)
MCH: 29.7 pg (ref 27.0–33.0)
MCHC: 32.4 g/dL (ref 32.0–36.0)
MCV: 91.9 fL (ref 80.0–100.0)
MONOS PCT: 7 %
MPV: 10.1 fL (ref 7.5–12.5)
Monocytes Absolute: 518 cells/uL (ref 200–950)
Neutro Abs: 2294 cells/uL (ref 1500–7800)
Neutrophils Relative %: 31 %
PLATELETS: 288 10*3/uL (ref 140–400)
RBC: 4.44 MIL/uL (ref 4.20–5.80)
RDW: 13.5 % (ref 11.0–15.0)
WBC: 7.4 10*3/uL (ref 3.8–10.8)

## 2016-10-16 MED ORDER — HYOSCYAMINE SULFATE ER 0.375 MG PO TB12: ORAL_TABLET | ORAL | 0 refills | Status: DC

## 2016-10-16 MED ORDER — CITALOPRAM HYDROBROMIDE 40 MG PO TABS: 40.0000 mg | ORAL_TABLET | Freq: Every day | ORAL | 0 refills | Status: DC

## 2016-10-16 MED ORDER — ATENOLOL 100 MG PO TABS: ORAL_TABLET | ORAL | 0 refills | Status: DC

## 2016-10-16 MED ORDER — VITAMIN D3 50 MCG (2000 UT) PO CAPS: 2000.0000 [IU] | ORAL_CAPSULE | Freq: Every day | ORAL | 0 refills | Status: DC

## 2016-10-16 MED ORDER — ROSUVASTATIN CALCIUM 10 MG PO TABS: 10.0000 mg | ORAL_TABLET | Freq: Every day | ORAL | 0 refills | Status: DC

## 2016-10-16 MED ORDER — LOSARTAN POTASSIUM-HCTZ 100-25 MG PO TABS: 1.0000 | ORAL_TABLET | Freq: Every day | ORAL | 0 refills | Status: DC

## 2016-10-16 MED ORDER — METFORMIN HCL ER 500 MG PO TB24
ORAL_TABLET | ORAL | 0 refills | Status: DC
Start: 2016-10-16 — End: 2017-01-22

## 2016-10-16 NOTE — Patient Instructions (Addendum)
Diabetes is a very complicated disease...lets simplify it.  An easy way to look at it to understand the complications is if you think of the extra sugar floating in your blood stream as glass shards floating through your blood stream.    Diabetes affects your small vessels first: 1) The glass shards (sugar) scraps down the tiny blood vessels in your eyes and lead to diabetic retinopathy, the leading cause of blindness in the [place]. Diabetes is the leading cause of newly diagnosed adult (20 to 64 years of age) blindness in the [place].  2) The glass shards scratches down the tiny vessels of your legs leading to nerve damage called neuropathy and can lead to amputations of your feet. More than 60% of all non-traumatic amputations of lower limbs occur in people with diabetes.  3) Over time the small vessels in your brain are shredded and closed off, individually this does not cause any problems but over a long period of time many of the small vessels being blocked can lead to Vascular Dementia.   4) Your kidney's are a filter system and have a "net" that keeps certain things in the body and lets bad things out. Sugar shreds this net and leads to kidney damage and eventually failure. Decreasing the sugar that is destroying the net and certain blood pressure medications can help stop or decrease progression of kidney disease. Diabetes was the primary cause of kidney failure in 44 percent of all new cases in 2011.  5) Diabetes also destroys the small vessels in your penis that lead to erectile dysfunction. Eventually the vessels are so damaged that you may not be responsive to cialis or viagra.   Diabetes and your large vessels: Your larger vessels consist of your coronary arteries in your heart and the carotid vessels to your brain. Diabetes or even increased sugars put you at 300% increased risk of heart attack and stroke and this is why.. The sugar scrapes down your large blood vessels and your body  sees this as an internal injury and tries to repair itself. Just like you get a scab on your skin, your platelets will stick to the blood vessel wall trying to heal it. This is why we have diabetics on low dose aspirin daily, this prevents the platelets from sticking and can prevent plaque formation. In addition, your body takes cholesterol and tries to shove it into the open wound. This is why we want your LDL, or bad cholesterol, below 70.   The combination of platelets and cholesterol over 5-10 years forms plaque that can break off and cause a heart attack or stroke.   PLEASE REMEMBER:  Diabetes is preventable! Up to 85 percent of complications and morbidities among individuals with type 2 diabetes can be prevented, delayed, or effectively treated and minimized with regular visits to a health professional, appropriate monitoring and medication, and a healthy diet and lifestyle.     Bad carbs also include fruit juice, alcohol, and sweet tea. These are empty calories that do not signal to your brain that you are full.   Please remember the good carbs are still carbs which convert into sugar. So please measure them out no more than 1/2-1 cup of rice, oatmeal, pasta, and beans  Veggies are however free foods! Pile them on.   Not all fruit is created equal. Please see the list below, the fruit at the bottom is higher in sugars than the fruit at the top. Please avoid all dried fruits.     

## 2016-10-17 LAB — MAGNESIUM: Magnesium: 1.9 mg/dL (ref 1.5–2.5)

## 2016-10-17 LAB — BASIC METABOLIC PANEL WITH GFR
BUN: 21 mg/dL (ref 7–25)
CHLORIDE: 106 mmol/L (ref 98–110)
CO2: 23 mmol/L (ref 20–31)
Calcium: 9.7 mg/dL (ref 8.6–10.3)
Creat: 0.95 mg/dL (ref 0.70–1.25)
GFR, EST NON AFRICAN AMERICAN: 84 mL/min (ref >=60)
GFR, Est African American: >89 mL/min (ref >=60)
Glucose, Bld: 110 mg/dL — ABNORMAL HIGH (ref 65–99)
POTASSIUM: 4 mmol/L (ref 3.5–5.3)
SODIUM: 140 mmol/L (ref 135–146)

## 2016-10-17 LAB — HEPATIC FUNCTION PANEL
ALK PHOS: 57 U/L (ref 40–115)
ALT: 16 U/L (ref 9–46)
AST: 19 U/L (ref 10–35)
Albumin: 4.1 g/dL (ref 3.6–5.1)
BILIRUBIN DIRECT: 0.1 mg/dL (ref <=0.2)
BILIRUBIN TOTAL: 0.5 mg/dL (ref 0.2–1.2)
Indirect Bilirubin: 0.4 mg/dL (ref 0.2–1.2)
Total Protein: 7.3 g/dL (ref 6.1–8.1)

## 2016-10-17 LAB — LIPID PANEL
CHOL/HDL RATIO: 3.7 ratio (ref <5.0)
Cholesterol: 142 mg/dL (ref <200)
HDL: 38 mg/dL — AB (ref >40)
LDL CALC: 76 mg/dL (ref <100)
TRIGLYCERIDES: 141 mg/dL (ref <150)
VLDL: 28 mg/dL (ref <30)

## 2016-10-17 LAB — TSH: TSH: 1.33 mIU/L (ref 0.40–4.50)

## 2016-10-17 LAB — VITAMIN D 25 HYDROXY (VIT D DEFICIENCY, FRACTURES): Vit D, 25-Hydroxy: 35 ng/mL (ref 30–100)

## 2016-10-17 LAB — HEMOGLOBIN A1C
HEMOGLOBIN A1C: 6.9 % — AB (ref <5.7)
Mean Plasma Glucose: 151 mg/dL

## 2016-10-20 NOTE — Progress Notes (Signed)
Pt aware of lab results & voiced understanding of those results.

## 2016-10-23 ENCOUNTER — Other Ambulatory Visit: Payer: Self-pay | Admitting: Internal Medicine

## 2016-10-23 DIAGNOSIS — K580 Irritable bowel syndrome with diarrhea: Secondary | ICD-10-CM

## 2016-10-23 MED ORDER — DICYCLOMINE HCL 20 MG PO TABS: ORAL_TABLET | ORAL | 1 refills | Status: DC

## 2016-10-26 ENCOUNTER — Telehealth: Payer: Self-pay | Admitting: *Deleted

## 2016-10-26 NOTE — Telephone Encounter (Signed)
Per a conversation with the patient, please discontinue Levbid, hyoscyamine, dicyclomine and plain Metformin(patient has the ER type)  [facility], per Dr Oneta RackMcKeown.

## 2017-01-22 ENCOUNTER — Other Ambulatory Visit: Payer: Self-pay | Admitting: Internal Medicine

## 2017-01-22 ENCOUNTER — Other Ambulatory Visit: Payer: Self-pay | Admitting: Physician Assistant

## 2017-01-22 DIAGNOSIS — E119 Type 2 diabetes mellitus without complications: Secondary | ICD-10-CM

## 2017-01-22 DIAGNOSIS — I10 Essential (primary) hypertension: Secondary | ICD-10-CM

## 2017-01-22 MED ORDER — ALPRAZOLAM 1 MG PO TABS: ORAL_TABLET | ORAL | 1 refills | Status: AC

## 2017-01-22 MED ORDER — CITALOPRAM HYDROBROMIDE 40 MG PO TABS: ORAL_TABLET | ORAL | 0 refills | Status: DC

## 2017-01-28 NOTE — Patient Instructions (Signed)

## 2017-01-28 NOTE — Progress Notes (Signed)
[name]   Lucky Cowboy, M.D.     Dyanne Carrel. Steffanie Dunn, P.A.-C Judd Gaudier, DNP [facility]                [address]                [place], [place]. [address] Telephone [phone] Telefax [phone] Annual  Screening/Preventative Visit  & Comprehensive Evaluation & Examination     This very nice 66 y.o. MBM  presents for a Screening/Preventative Visit & comprehensive evaluation and management of multiple medical co-morbidities.  Patient has been followed for HTN, T2_NIDDM, Hyperlipidemia and Vitamin D Deficiency. Patient has GERD and recently has been having more dyspepsia despite OTC Ranitidine/Famodipine.      HTN predates since 1989. Patient's BP has been controlled at home.  Today's BP is at goal - 110/84. Patient denies any cardiac symptoms as chest pain, palpitations, shortness of breath, dizziness or ankle swelling.     Patient's hyperlipidemia is controlled with diet and medications. Patient denies myalgias or other medication SE's. Last lipids were at goal:  Lab Results  Component Value Date   CHOL 142 10/16/2016   HDL 38 (L) 10/16/2016   LDLCALC 76 10/16/2016   TRIG 141 10/16/2016   CHOLHDL 3.7 10/16/2016      Patient has Morbid Obesity (BMI 31+) and  Consequent T2_NIDDM w/CKD2 (GFR 84 ml/min)  since 2007. Patient is taking Metformin and is not monitoring CBG's.  He denies reactive hypoglycemic symptoms, visual blurring, diabetic polys or paresthesias. Last A1c was not at goal: Lab Results  Component Value Date   HGBA1C 6.9 (H) 10/16/2016       Patient has hx/o Testosterone Deficiency with levels of 209 on Oct 2015 and 154 in Oct 2016 and he has deferred replacement therapy. Finally, patient has history of Vitamin D Deficiency ("22" / 2008)  and last vitamin D was still low: Lab Results  Component Value Date   VD25OH 35 10/16/2016   Current Outpatient Prescriptions on File Prior to Visit   Medication Sig  . ALPRAZolam  1 MG tablet Take 1/2 to 1 tablet at hour of sleep  . aspirin 81 MG tablet Take 81 mg by mouth daily.  Marland Kitchen VIT D3 2000 units capsule Takes 6000 units daily  . citalopram (CELEXA) 40 MG tablet Take 1 tablet daily for Mood  . dicyclomine (BENTYL) 20 MG tablet Take 1 tablet 3 x / day for Irritable Bowel  . losartan-hctz 100-25 MG tablet TAKE 1 TAB DAILY   . Magnesium 400 MG CAPS Take2  times daily.   . metFORMIN-XR 500 MG  TAKE 2 TAB TWICE DAILY   . Probiotic  Take by mouth daily.  . rosuvastatin  10 MG tablet TAKE 1 TAB DAILY  . atenolol 100 MG tablet Take 1 tablet daily for BP   No Known Allergies   Past Medical History:  Diagnosis Date  . Anxiety    On Xanax  . Chest pain   . Fatigue   . GERD (gastroesophageal reflux disease)   . HTN (hypertension)   . Hyperlipemia   . IBS (irritable bowel syndrome)   . OSA (obstructive sleep apnea)    Restated CPAP  . Type II or unspecified type diabetes mellitus without mention of complication, not stated as uncontrolled   . Vitamin D deficiency    Health Maintenance  Topic Date Due  . OPHTHALMOLOGY EXAM  01/19/2016  . INFLUENZA VACCINE  12/13/2016  . HEMOGLOBIN A1C  04/17/2017  . COLONOSCOPY  06/27/2017  . FOOT EXAM  01/28/2018  . TETANUS/TDAP  01/31/2024  . Hepatitis C Screening  Completed  . HIV Screening  Completed   Immunization History  Administered Date(s) Administered  . Influenza Split 03/01/2015  . Influenza,inj,quad, With Preservative 03/03/2016  . PPD Test 01/30/2014  . Pneumococcal Polysaccharide-23 05/21/2008  . Td 10/30/2003  . Tdap 01/30/2014   Past Surgical History:  Procedure Laterality Date  . WISDOM TOOTH EXTRACTION     Family History  Problem Relation Age of Onset  . Stroke Unknown   . Hypertension Unknown   . Hyperlipidemia Unknown   . Diabetes Unknown    Social History   Social History  . Marital status: Married    Spouse name: N/A  . Number of children: N/A  .  Years of education: N/A   Occupational History  . Sales   Social History Main Topics  . Smoking status: Former Smoker    Quit date: 09/06/2007  . Smokeless tobacco: Never Used  . Alcohol use Yes     Comment: occasional  . Drug use: No  . Sexual activity: Not on file    ROS Constitutional: Denies fever, chills, weight loss/gain, headaches, insomnia,  night sweats or change in appetite. Does c/o fatigue. Eyes: Denies redness, blurred vision, diplopia, discharge, itchy or watery eyes.  ENT: Denies discharge, congestion, post nasal drip, epistaxis, sore throat, earache, hearing loss, dental pain, Tinnitus, Vertigo, Sinus pain or snoring.  Cardio: Denies chest pain, palpitations, irregular heartbeat, syncope, dyspnea, diaphoresis, orthopnea, PND, claudication or edema Respiratory: denies cough, dyspnea, DOE, pleurisy, hoarseness, laryngitis or wheezing.  Gastrointestinal: Denies dysphagia, heartburn, reflux, water brash, pain, cramps, nausea, vomiting, bloating, diarrhea, constipation, hematemesis, melena, hematochezia, jaundice or hemorrhoids Genitourinary: Denies dysuria, frequency, urgency, nocturia, hesitancy, discharge, hematuria or flank pain Musculoskeletal: Denies arthralgia, myalgia, stiffness, Jt. Swelling, pain, limp or strain/sprain. Denies Falls. Skin: Denies puritis, rash, hives, warts, acne, eczema or change in skin lesion Neuro: No weakness, tremor, incoordination, spasms, paresthesia or pain Psychiatric: Denies confusion, memory loss or sensory loss. Denies Depression. Endocrine: Denies change in weight, skin, hair change, nocturia, and paresthesia, diabetic polys, visual blurring or hyper / hypo glycemic episodes.  Heme/Lymph: No excessive bleeding, bruising or enlarged lymph nodes.  Physical Exam  BP 110/84   Pulse 68   Temp (!) 97.3 F (36.3 C)   Resp 18   Ht 6' (1.829 m)   Wt 232 lb 3.2 oz (105.3 kg)   BMI 31.49 kg/m   General Appearance: Well nourished and  well groomed and in no apparent distress.  Eyes: PERRLA, EOMs, conjunctiva no swelling or erythema, normal fundi and vessels. Sinuses: No frontal/maxillary tenderness ENT/Mouth: EACs patent / TMs  nl. Nares clear without erythema, swelling, mucoid exudates. Oral hygiene is good. No erythema, swelling, or exudate. Tongue normal, non-obstructing. Tonsils not swollen or erythematous. Hearing normal.  Neck: Supple, thyroid normal. No bruits, nodes or JVD. Respiratory: Respiratory effort normal.  BS equal and clear bilateral without rales, rhonci, wheezing or stridor. Cardio: Heart sounds are normal with regular rate and rhythm and no murmurs, rubs or gallops. Peripheral pulses are normal and equal bilaterally without edema. No aortic or femoral bruits. Chest: symmetric with normal excursions and percussion.  Abdomen: Soft, with Nl bowel sounds. Nontender, no guarding, rebound, hernias, masses, or organomegaly.  Lymphatics: Non tender without lymphadenopathy.  Genitourinary: No hernias.Testes nl. DRE - prostate nl for age - smooth &  firm w/o nodules. Musculoskeletal: Full ROM all peripheral extremities, joint stability, 5/5 strength, and normal gait. Skin: Warm and dry without rashes, lesions, cyanosis, clubbing or  ecchymosis.  Neuro: Cranial nerves intact, reflexes equal bilaterally. Normal muscle tone, no cerebellar symptoms. Sensation intact to touch, vibratory and Monofilament to the toes bilaterally. Pysch: Alert and oriented X 3 with normal affect, insight and judgment appropriate.   Assessment and Plan  1. Annual Preventative/Screening Exam   2. Essential hypertension  - EKG 12-Lead - [place], RETROPERITNL ABD,  LTD - Urinalysis, Routine w reflex microscopic - Microalbumin / creatinine urine ratio - CBC with Differential/Platelet - BASIC METABOLIC PANEL WITH GFR - Magnesium - TSH  3. Hyperlipidemia, mixed  - EKG 12-Lead - [place], RETROPERITNL ABD,  LTD - Hepatic function panel - Lipid  panel - TSH  4. Type 2 diabetes mellitus with stage 2 chronic kidney disease, without long-term current use of insulin (HCC)  - EKG 12-Lead - [place], RETROPERITNL ABD,  LTD - Urinalysis, Routine w reflex microscopic - Microalbumin / creatinine urine ratio - HM DIABETES FOOT EXAM - LOW EXTREMITY NEUR EXAM DOCUM - Hemoglobin A1c - Insulin, fasting  5. Vitamin D deficiency  - VITAMIN D 25 Hydroxy  6. Testosterone deficiency  - Testosterone  7. Screening for rectal cancer  - POC Hemoccult Bld/Stl  8. Screening for prostate cancer  - PSA  9. Screening for ischemic heart disease (IHD)  - EKG 12-Lead  10. Screening for AAA (aortic abdominal aneurysm)  - [place], RETROPERITNL ABD,  LTD  11. Screening for malnutrition  - Zinc  12. Fatigue, unspecified type  - Iron,Total/Total Iron Binding Cap - Vitamin B12 - Testosterone - CBC with Differential/Platelet - TSH  13. Medication management  - Urinalysis, Routine w reflex microscopic - Microalbumin / creatinine urine ratio - CBC with Differential/Platelet - BASIC METABOLIC PANEL WITH GFR - Hepatic function panel - Magnesium - Lipid panel - TSH - Hemoglobin A1c - Insulin, fasting - VITAMIN D 25 Hydroxy         Patient was counseled in prudent diet, weight control to achieve/maintain BMI less than 25, BP monitoring, regular exercise and medications as discussed.  Discussed med effects and SE's. Routine screening labs and tests as requested with regular follow-up as recommended. Over 40 minutes of exam, counseling, chart review and high complex critical decision making was performed

## 2017-01-29 ENCOUNTER — Encounter: Payer: Self-pay | Admitting: Internal Medicine

## 2017-01-29 ENCOUNTER — Ambulatory Visit (INDEPENDENT_AMBULATORY_CARE_PROVIDER_SITE_OTHER): Payer: Managed Care, Other (non HMO) | Admitting: Internal Medicine

## 2017-01-29 VITALS — BP 110/84 | HR 68 | Temp 97.3°F | Resp 18 | Ht 72.0 in | Wt 232.2 lb

## 2017-01-29 DIAGNOSIS — E559 Vitamin D deficiency, unspecified: Secondary | ICD-10-CM

## 2017-01-29 DIAGNOSIS — Z1212 Encounter for screening for malignant neoplasm of rectum: Secondary | ICD-10-CM

## 2017-01-29 DIAGNOSIS — Z1321 Encounter for screening for nutritional disorder: Secondary | ICD-10-CM

## 2017-01-29 DIAGNOSIS — Z111 Encounter for screening for respiratory tuberculosis: Secondary | ICD-10-CM | POA: Diagnosis not present

## 2017-01-29 DIAGNOSIS — R5383 Other fatigue: Secondary | ICD-10-CM

## 2017-01-29 DIAGNOSIS — I10 Essential (primary) hypertension: Secondary | ICD-10-CM | POA: Diagnosis not present

## 2017-01-29 DIAGNOSIS — E1122 Type 2 diabetes mellitus with diabetic chronic kidney disease: Secondary | ICD-10-CM

## 2017-01-29 DIAGNOSIS — Z136 Encounter for screening for cardiovascular disorders: Secondary | ICD-10-CM

## 2017-01-29 DIAGNOSIS — Z125 Encounter for screening for malignant neoplasm of prostate: Secondary | ICD-10-CM

## 2017-01-29 DIAGNOSIS — K219 Gastro-esophageal reflux disease without esophagitis: Secondary | ICD-10-CM

## 2017-01-29 DIAGNOSIS — N182 Chronic kidney disease, stage 2 (mild): Secondary | ICD-10-CM

## 2017-01-29 DIAGNOSIS — Z0000 Encounter for general adult medical examination without abnormal findings: Secondary | ICD-10-CM

## 2017-01-29 DIAGNOSIS — Z79899 Other long term (current) drug therapy: Secondary | ICD-10-CM | POA: Diagnosis not present

## 2017-01-29 DIAGNOSIS — Z23 Encounter for immunization: Secondary | ICD-10-CM

## 2017-01-29 DIAGNOSIS — E291 Testicular hypofunction: Secondary | ICD-10-CM | POA: Diagnosis not present

## 2017-01-29 DIAGNOSIS — Z0001 Encounter for general adult medical examination with abnormal findings: Secondary | ICD-10-CM

## 2017-01-29 DIAGNOSIS — E782 Mixed hyperlipidemia: Secondary | ICD-10-CM

## 2017-01-29 DIAGNOSIS — E349 Endocrine disorder, unspecified: Secondary | ICD-10-CM

## 2017-01-29 MED ORDER — PANTOPRAZOLE SODIUM 40 MG PO TBEC: DELAYED_RELEASE_TABLET | ORAL | 3 refills | Status: DC

## 2017-01-30 ENCOUNTER — Encounter: Payer: Self-pay | Admitting: Gastroenterology

## 2017-01-31 LAB — CBC WITH DIFFERENTIAL/PLATELET
BASOS ABS: 76 {cells}/uL (ref 0–200)
Basophils Relative: 1 %
Eosinophils Absolute: 266 cells/uL (ref 15–500)
Eosinophils Relative: 3.5 %
HEMATOCRIT: 39.6 % (ref 38.5–50.0)
Hemoglobin: 13.4 g/dL (ref 13.2–17.1)
LYMPHS ABS: 4271 {cells}/uL — AB (ref 850–3900)
MCH: 30.9 pg (ref 27.0–33.0)
MCHC: 33.8 g/dL (ref 32.0–36.0)
MCV: 91.5 fL (ref 80.0–100.0)
MPV: 10.5 fL (ref 7.5–12.5)
Monocytes Relative: 7.2 %
NEUTROS PCT: 32.1 %
Neutro Abs: 2440 cells/uL (ref 1500–7800)
Platelets: 312 10*3/uL (ref 140–400)
RBC: 4.33 10*6/uL (ref 4.20–5.80)
RDW: 12 % (ref 11.0–15.0)
Total Lymphocyte: 56.2 %
WBC: 7.6 10*3/uL (ref 3.8–10.8)
WBCMIX: 547 {cells}/uL (ref 200–950)

## 2017-01-31 LAB — URINALYSIS, ROUTINE W REFLEX MICROSCOPIC
Bacteria, UA: NONE SEEN /HPF
Bilirubin Urine: NEGATIVE
Glucose, UA: NEGATIVE
HYALINE CAST: NONE SEEN /LPF
Hgb urine dipstick: NEGATIVE
Ketones, ur: NEGATIVE
LEUKOCYTES UA: NEGATIVE
Nitrite: NEGATIVE
SPECIFIC GRAVITY, URINE: 1.026 (ref 1.001–1.03)
Squamous Epithelial / HPF: NONE SEEN /HPF (ref <=5)
pH: 6.5 (ref 5.0–8.0)

## 2017-01-31 LAB — MAGNESIUM: MAGNESIUM: 1.8 mg/dL (ref 1.5–2.5)

## 2017-01-31 LAB — IRON, TOTAL/TOTAL IRON BINDING CAP
%SAT: 24 % (calc) (ref 15–60)
Iron: 88 ug/dL (ref 50–180)
TIBC: 363 ug/dL (ref 250–425)

## 2017-01-31 LAB — BASIC METABOLIC PANEL WITH GFR
BUN: 18 mg/dL (ref 7–25)
CHLORIDE: 98 mmol/L (ref 98–110)
CO2: 26 mmol/L (ref 20–32)
CREATININE: 0.93 mg/dL (ref 0.70–1.25)
Calcium: 10.1 mg/dL (ref 8.6–10.3)
GFR, Est African American: 100 mL/min/{1.73_m2} (ref >=60)
GFR, Est Non African American: 86 mL/min/{1.73_m2} (ref >=60)
GLUCOSE: 98 mg/dL (ref 65–99)
Potassium: 4.1 mmol/L (ref 3.5–5.3)
Sodium: 136 mmol/L (ref 135–146)

## 2017-01-31 LAB — HEPATIC FUNCTION PANEL
AG Ratio: 1.3 (calc) (ref 1.0–2.5)
ALBUMIN MSPROF: 4.3 g/dL (ref 3.6–5.1)
ALT: 19 U/L (ref 9–46)
AST: 22 U/L (ref 10–35)
Alkaline phosphatase (APISO): 62 U/L (ref 40–115)
Bilirubin, Direct: 0.1 mg/dL (ref 0.0–0.2)
GLOBULIN: 3.3 g/dL (ref 1.9–3.7)
Indirect Bilirubin: 0.4 mg/dL (calc) (ref 0.2–1.2)
TOTAL PROTEIN: 7.6 g/dL (ref 6.1–8.1)
Total Bilirubin: 0.5 mg/dL (ref 0.2–1.2)

## 2017-01-31 LAB — PSA: PSA: 1.1 ng/mL (ref <=4.0)

## 2017-01-31 LAB — VITAMIN D 25 HYDROXY (VIT D DEFICIENCY, FRACTURES): VIT D 25 HYDROXY: 36 ng/mL (ref 30–100)

## 2017-01-31 LAB — MICROALBUMIN / CREATININE URINE RATIO
CREATININE, URINE: 188 mg/dL (ref 20–320)
MICROALB/CREAT RATIO: 9 ug/mg{creat} (ref <30)
Microalb, Ur: 1.6 mg/dL

## 2017-01-31 LAB — LIPID PANEL
CHOLESTEROL: 148 mg/dL (ref <200)
HDL: 39 mg/dL — AB (ref >40)
LDL CHOLESTEROL (CALC): 86 mg/dL
Non-HDL Cholesterol (Calc): 109 mg/dL (calc) (ref <130)
TRIGLYCERIDES: 125 mg/dL (ref <150)
Total CHOL/HDL Ratio: 3.8 (calc) (ref <5.0)

## 2017-01-31 LAB — HEMOGLOBIN A1C
Hgb A1c MFr Bld: 6.9 % of total Hgb — ABNORMAL HIGH (ref <5.7)
Mean Plasma Glucose: 151 (calc)
eAG (mmol/L): 8.4 (calc)

## 2017-01-31 LAB — TESTOSTERONE: TESTOSTERONE: 234 ng/dL — AB (ref 250–827)

## 2017-01-31 LAB — VITAMIN B12: Vitamin B-12: 383 pg/mL (ref 200–1100)

## 2017-01-31 LAB — TSH: TSH: 3.65 mIU/L (ref 0.40–4.50)

## 2017-01-31 LAB — INSULIN, FASTING: Insulin: 10.7 u[IU]/mL (ref 2.0–19.6)

## 2017-01-31 LAB — ZINC: Zinc: 72 ug/dL (ref 60–130)

## 2017-02-01 LAB — TB SKIN TEST
Induration: 0 mm
TB Skin Test: NEGATIVE

## 2017-02-14 ENCOUNTER — Other Ambulatory Visit: Payer: Self-pay | Admitting: Internal Medicine

## 2017-03-21 ENCOUNTER — Ambulatory Visit (AMBULATORY_SURGERY_CENTER): Payer: Self-pay | Admitting: *Deleted

## 2017-03-21 ENCOUNTER — Other Ambulatory Visit: Payer: Self-pay

## 2017-03-21 VITALS — Ht 72.0 in | Wt 236.0 lb

## 2017-03-21 DIAGNOSIS — Z800 Family history of malignant neoplasm of digestive organs: Secondary | ICD-10-CM

## 2017-03-21 MED ORDER — NA SULFATE-K SULFATE-MG SULF 17.5-3.13-1.6 GM/177ML PO SOLN: 1.0000 | Freq: Once | ORAL | 0 refills | Status: AC

## 2017-03-21 NOTE — Progress Notes (Signed)
No egg or soy allergy known to patient  No issues with past sedation with any surgeries  or procedures, no intubation problems  No diet pills per patient No home 02 use per patient  No blood thinners per patient  Pt denies issues with constipation  No A fib or A flutter  EMMI video sent to pt's e mail-pt declined  $15 COUPON TO PT FOR SUPREP IN PV TODAY

## 2017-03-27 ENCOUNTER — Encounter: Payer: Self-pay | Admitting: Gastroenterology

## 2017-03-30 ENCOUNTER — Encounter: Payer: Self-pay | Admitting: Gastroenterology

## 2017-03-30 ENCOUNTER — Ambulatory Visit (AMBULATORY_SURGERY_CENTER): Payer: Managed Care, Other (non HMO) | Admitting: Gastroenterology

## 2017-03-30 VITALS — BP 122/75 | HR 81 | Temp 95.9°F | Resp 19 | Ht 72.0 in | Wt 236.0 lb

## 2017-03-30 DIAGNOSIS — Z1211 Encounter for screening for malignant neoplasm of colon: Secondary | ICD-10-CM | POA: Diagnosis not present

## 2017-03-30 DIAGNOSIS — Z800 Family history of malignant neoplasm of digestive organs: Secondary | ICD-10-CM | POA: Diagnosis present

## 2017-03-30 DIAGNOSIS — Z1212 Encounter for screening for malignant neoplasm of rectum: Secondary | ICD-10-CM

## 2017-03-30 MED ORDER — SODIUM CHLORIDE 0.9 % IV SOLN: 500.0000 mL | INTRAVENOUS | Status: DC

## 2017-03-30 NOTE — Addendum Note (Signed)
Addended by: Vernell BarrierSECHLER, Jeramie Scogin M on: 03/30/2017 08:56 AM   Modules accepted: Orders

## 2017-03-30 NOTE — Patient Instructions (Signed)
Impression/Recommendations:  Resume previous diet. Continue present medications.  Repeat colonoscopy in 5 years.  YOU HAD AN ENDOSCOPIC PROCEDURE TODAY AT THE [facility]:   Refer to the procedure report that was given to you for any specific questions about what was found during the examination.  If the procedure report does not answer your questions, please call your gastroenterologist to clarify.  If you requested that your care partner not be given the details of your procedure findings, then the procedure report has been included in a sealed envelope for you to review at your convenience later.  YOU SHOULD EXPECT: Some feelings of bloating in the abdomen. Passage of more gas than usual.  Walking can help get rid of the air that was put into your GI tract during the procedure and reduce the bloating. If you had a lower endoscopy (such as a colonoscopy or flexible sigmoidoscopy) you may notice spotting of blood in your stool or on the toilet paper. If you underwent a bowel prep for your procedure, you may not have a normal bowel movement for a few days.  Please Note:  You might notice some irritation and congestion in your nose or some drainage.  This is from the oxygen used during your procedure.  There is no need for concern and it should clear up in a day or so.  SYMPTOMS TO REPORT IMMEDIATELY:   Following lower endoscopy (colonoscopy or flexible sigmoidoscopy):  Excessive amounts of blood in the stool  Significant tenderness or worsening of abdominal pains  Swelling of the abdomen that is new, acute  Fever of 100F or higher For urgent or emergent issues, a gastroenterologist can be reached at any hour by calling [phone].   DIET:  We do recommend a small meal at first, but then you may proceed to your regular diet.  Drink plenty of fluids but you should avoid alcoholic beverages for 24 hours.  ACTIVITY:  You should plan to take it easy for the rest of today and  you should NOT DRIVE or use heavy machinery until tomorrow (because of the sedation medicines used during the test).    FOLLOW UP: Our staff will call the number listed on your records the next business day following your procedure to check on you and address any questions or concerns that you may have regarding the information given to you following your procedure. If we do not reach you, we will leave a message.  However, if you are feeling well and you are not experiencing any problems, there is no need to return our call.  We will assume that you have returned to your regular daily activities without incident.  If any biopsies were taken you will be contacted by phone or by letter within the next 1-3 weeks.  Please call us at [phone] if you have not heard about the biopsies in 3 weeks.    SIGNATURES/CONFIDENTIALITY: You and/or your care partner have signed paperwork which will be entered into your electronic medical record.  These signatures attest to the fact that that the information above on your After Visit Summary has been reviewed and is understood.  Full responsibility of the confidentiality of this discharge information lies with you and/or your care-partner.

## 2017-03-30 NOTE — Progress Notes (Signed)
To PACU, VSS. Report to [email or web address] 

## 2017-03-30 NOTE — Op Note (Signed)
[facility] Patient Name: [name] Procedure Date: 03/30/2017 7:20 AM MRN: [id number] Endoscopist: Rachael Fee , MD Age: 66 Referring MD:  Date of Birth: 1950/07/28 Gender: Male Account #: [id number] Procedure:                Colonoscopy Indications:              Screening in patient at increased risk: Family                            history of 1st-degree relative with colorectal                            cancer before age 81 years (father in his 9s) Medicines:                Monitored Anesthesia Care Procedure:                Pre-Anesthesia Assessment:                           - Prior to the procedure, a History and Physical                            was performed, and patient medications and                            allergies were reviewed. The patient's tolerance of                            previous anesthesia was also reviewed. The risks                            and benefits of the procedure and the sedation                            options and risks were discussed with the patient.                            All questions were answered, and informed consent                            was obtained. Prior Anticoagulants: The patient has                            taken no previous anticoagulant or antiplatelet                            agents. ASA Grade Assessment: II - A patient with                            mild systemic disease. After reviewing the risks                            and benefits, the patient was deemed in  satisfactory condition to undergo the procedure.                           After obtaining informed consent, the colonoscope                            was passed under direct vision. Throughout the                            procedure, the patient's blood pressure, pulse, and                            oxygen saturations were monitored continuously. The                            Colonoscope was  introduced through the anus and                            advanced to the the cecum, identified by                            appendiceal orifice and ileocecal valve. The                            colonoscopy was performed without difficulty. The                            patient tolerated the procedure well. The quality                            of the bowel preparation was excellent. The                            ileocecal valve, appendiceal orifice, and rectum                            were photographed. Scope In: 7:51:15 AM Scope Out: 8:00:44 AM Scope Withdrawal Time: 0 hours 7 minutes 4 seconds  Total Procedure Duration: 0 hours 9 minutes 29 seconds  Findings:                 The entire examined colon appeared normal on direct                            and retroflexion views.                           No polyps or cancers. Complications:            No immediate complications. Estimated blood loss:                            None. Estimated Blood Loss:     Estimated blood loss: none. Impression:               - The entire examined colon is normal on direct and  retroflexion views.                           - No polyps or cancers. Recommendation:           - Patient has a contact number available for                            emergencies. The signs and symptoms of potential                            delayed complications were discussed with the                            patient. Return to normal activities tomorrow.                            Written discharge instructions were provided to the                            patient.                           - Resume previous diet.                           - Continue present medications.                           - Repeat colonoscopy in 5 years for screening. Rachael Feeaniel P Jacobs, MD 03/30/2017 8:05:30 AM This report has been signed electronically.

## 2017-04-02 ENCOUNTER — Encounter: Payer: Self-pay | Admitting: *Deleted

## 2017-04-02 ENCOUNTER — Telehealth: Payer: Self-pay | Admitting: *Deleted

## 2017-04-02 NOTE — Telephone Encounter (Signed)
  Follow up Call-  Call back number 03/30/2017  Post procedure Call Back phone  # [phone]  Permission to leave phone message Yes  Some recent data might be hidden     Patient questions:  Do you have a fever, pain , or abdominal swelling? No. Pain Score  0 *  Have you tolerated food without any problems? No.  Have you been able to return to your normal activities? Yes.    Do you have any questions about your discharge instructions: Diet   No. Medications  No. Follow up visit  No.  Do you have questions or concerns about your Care? No.  Actions: * If pain score is 4 or above: No action needed, pain <4.

## 2017-04-02 NOTE — Telephone Encounter (Signed)
  Follow up Call-  Call back number 03/30/2017  Post procedure Call Back phone  # [phone]  Permission to leave phone message Yes  Some recent data might be hidden     Patient questions:  Do you have a fever, pain , or abdominal swelling? No. Pain Score  0 *  Have you tolerated food without any problems? Yes.    Have you been able to return to your normal activities? Yes.    Do you have any questions about your discharge instructions: Diet   No. Medications  No. Follow up visit  No.  Do you have questions or concerns about your Care? No.  Actions: * If pain score is 4 or above: No action needed, pain <4.

## 2017-04-02 NOTE — Telephone Encounter (Signed)
errior

## 2017-04-29 NOTE — Progress Notes (Signed)
FOLLOW UP  Assessment and Plan:   Hypertension Well controlled with current medications; on ARB Monitor blood pressure at home; patient to call if consistently greater than 130/80 Continue DASH diet.   Reminder to go to the ER if any CP, SOB, nausea, dizziness, severe HA, changes vision/speech, left arm numbness and tingling and jaw pain.  Cholesterol Continue statin medication Continue low cholesterol diet and exercise.  Check lipid panel.   Diabetes without complications Continue medication: metformin  Continue diet and exercise.  Perform daily foot/skin check, notify office of any concerning changes.  Check A1C  GERD Well managed on current medications Discussed risks/benefits of long term PPI use and recommended taper as tolerated Discussed diet, avoiding triggers and other lifestyle changes  Anxiety Well managed by current regimen; rare use of benzos Stress management techniques discussed, increase water, good sleep hygiene discussed, increase exercise, and increase veggies.   Obesity with co morbidities Long discussion about weight loss, diet, and exercise Discussed ideal weight for height and initial weight goal Patient will work on portion sizes and gentle exercise Will follow up in 3 months  Vitamin D Def/ osteoporosis prevention Continue supplementation; increased dose as indicated for goal of 70-100 Check Vit D level  Continue diet and meds as discussed. Further disposition pending results of labs. Discussed med's effects and SE's.   Over 30 minutes of exam, counseling, chart review, and critical decision making was performed.   Future Appointments  Date Time Provider Department Center  08/06/2017  2:30 PM Lucky Cowboy, MD [facility] None  02/19/2018  3:00 PM Lucky Cowboy, MD [facility] None    ----------------------------------------------------------------------------------------------------------------------  HPI 66 y.o. male  presents for 3  month follow up on hypertension, cholesterol, T2 diabetes treated by oral antidiabetic agents, GERD, obesity, anxiety and vitamin D deficiency. He recently had colonoscopy in November - results reviewed with him. He continues to follow with Dr. Robyne Askew with neurosurgery for his back pain with L sided sciatica; discussing surgery for L4-5 disc degeneration but patient would like to avoid if possible.   he has a diagnosis of anxiety and is currently prescribed xanax 0.5-1 mg - he has tapered use significantly, uses very rarely for stress and sleep, last was over 30 days ago, reports symptoms are well controlled on current regimen.   BMI is Body mass index is 32.41 kg/m., he has been working on diet and exercises intermittently- somewhat limited recently by back pain and work schedule. Wt Readings from Last 3 Encounters:  04/30/17 239 lb (108.4 kg)  03/30/17 236 lb (107 kg)  03/21/17 236 lb (107 kg)   he has a diagnosis of GERD which is currently managed by protonix 40 mg daily; at the last visit patient had reported poor control of symptoms despite use of OTC ranitidine/famodipine. he reports symptoms is currently well controlled, and denies breakthrough reflux, burning in chest, hoarseness or cough.    His blood pressure has been controlled at home, today their BP is BP: 116/74  He does not workout. He denies chest pain, shortness of breath, dizziness.   He is on cholesterol medication and denies myalgias. His cholesterol is at goal. The cholesterol last visit was:   Lab Results  Component Value Date   CHOL 148 01/29/2017   HDL 39 (L) 01/29/2017   LDLCALC 76 10/16/2016   TRIG 125 01/29/2017   CHOLHDL 3.8 01/29/2017    He has been working on diet and exercise for T2 diabetes, and denies increased appetite, nausea, paresthesia of  the feet, polydipsia, polyuria, visual disturbances and vomiting. Last A1C in the office was:  Lab Results  Component Value Date   HGBA1C 6.9 (H) 01/29/2017    Patient is on Vitamin D supplement but remained below goal at the last visit:    Lab Results  Component Value Date   VD25OH 36 01/29/2017       Current Medications:  Current Outpatient Medications on File Prior to Visit  Medication Sig  . ALPRAZolam (XANAX) 1 MG tablet Take 1/2 to 1 tablet at hour of sleep  . aspirin 81 MG tablet Take 81 mg by mouth daily.  . Cholecalciferol (VITAMIN D3) 2000 units capsule Take 1 capsule (2,000 Units total) by mouth daily. Takes 6000 units daily  . citalopram (CELEXA) 40 MG tablet TAKE 1 TABLET BY MOUTH DAILY FOR MOOD  . losartan-hydrochlorothiazide (HYZAAR) 100-25 MG tablet TAKE 1 TABLET BY MOUTH DAILY FOR BLOOD PRESSURE  . Magnesium 400 MG CAPS Take by mouth 2 (two) times daily.   . metFORMIN (GLUCOPHAGE-XR) 500 MG 24 hr tablet TAKE 2 TABLETS BY MOUTH TWICE DAILY WITH FOOD FOR DIABETES  . pantoprazole (PROTONIX) 40 MG tablet Take 1 tablet daily for Heartburn & Indigestion  . Probiotic Product (PROBIOTIC DAILY PO) Take by mouth daily.  . rosuvastatin (CRESTOR) 10 MG tablet TAKE 1 TABLET BY MOUTH DAILY  . atenolol (TENORMIN) 100 MG tablet Take 1 tablet daily for BP  . dicyclomine (BENTYL) 20 MG tablet Take 1 tablet 3 x / day for Irritable Bowel   Current Facility-Administered Medications on File Prior to Visit  Medication  . 0.9 %  sodium chloride infusion     Allergies: No Known Allergies   Medical History:  Past Medical History:  Diagnosis Date  . Allergy   . Anxiety    On Xanax  . Chest pain   . Fatigue   . GERD (gastroesophageal reflux disease)   . Heart murmur    hx of MVP   . HTN (hypertension)   . Hyperlipemia   . IBS (irritable bowel syndrome)   . OSA (obstructive sleep apnea)    Restated CPAP  . Sleep apnea    no cpap   . Type II or unspecified type diabetes mellitus without mention of complication, not stated as uncontrolled   . Vitamin D deficiency    Family history- Reviewed and unchanged Social history- Reviewed  and unchanged   Review of Systems:  Review of Systems  Constitutional: Negative for malaise/fatigue and weight loss.  HENT: Negative for hearing loss and tinnitus.   Eyes: Negative for blurred vision and double vision.  Respiratory: Negative for cough, shortness of breath and wheezing.   Cardiovascular: Negative for chest pain, palpitations, orthopnea, claudication and leg swelling.  Gastrointestinal: Negative for abdominal pain, blood in stool, constipation, diarrhea, heartburn, melena, nausea and vomiting.  Genitourinary: Negative.   Musculoskeletal: Negative for joint pain and myalgias.  Skin: Negative for rash.  Neurological: Negative for dizziness, tingling, sensory change, weakness and headaches.  Endo/Heme/Allergies: Negative for polydipsia.  Psychiatric/Behavioral: Negative for depression and substance abuse. The patient is nervous/anxious (Rare, after bad day at work) and has insomnia (Rare, related to anxiety/stress).   All other systems reviewed and are negative.   Physical Exam: BP 116/74   Pulse (!) 119   Temp (!) 95.9 F (35.5 C)   Ht 6' (1.829 m)   Wt 239 lb (108.4 kg)   SpO2 97%   BMI 32.41 kg/m  Wt Readings from Last  3 Encounters:  04/30/17 239 lb (108.4 kg)  03/30/17 236 lb (107 kg)  03/21/17 236 lb (107 kg)   General Appearance: Well nourished, in no apparent distress. Eyes: PERRLA, EOMs, conjunctiva no swelling or erythema Sinuses: No Frontal/maxillary tenderness ENT/Mouth: Ext aud canals clear, TMs without erythema, bulging. No erythema, swelling, or exudate on post pharynx.  Tonsils not swollen or erythematous. Hearing normal.  Neck: Supple, thyroid normal.  Respiratory: Respiratory effort normal, BS equal bilaterally without rales, rhonchi, wheezing or stridor.  Cardio: RRR with no MRGs. Brisk peripheral pulses without edema.  Abdomen: Soft, + BS.  Non tender, no guarding, rebound, hernias, masses. Lymphatics: Non tender without lymphadenopathy.   Musculoskeletal: Full ROM, 5/5 strength, Normal gait Skin: Warm, dry without rashes, lesions, ecchymosis.  Neuro: Cranial nerves intact. No cerebellar symptoms.  Psych: Awake and oriented X 3, normal affect, Insight and Judgment appropriate.    Dan MakerAshley C Graeden Bitner, NP 3:00 PM [facility]

## 2017-04-30 ENCOUNTER — Encounter: Payer: Self-pay | Admitting: Adult Health

## 2017-04-30 ENCOUNTER — Ambulatory Visit (INDEPENDENT_AMBULATORY_CARE_PROVIDER_SITE_OTHER): Payer: Managed Care, Other (non HMO) | Admitting: Adult Health

## 2017-04-30 VITALS — BP 116/74 | HR 89 | Temp 95.9°F | Ht 72.0 in | Wt 239.0 lb

## 2017-04-30 DIAGNOSIS — F419 Anxiety disorder, unspecified: Secondary | ICD-10-CM

## 2017-04-30 DIAGNOSIS — E119 Type 2 diabetes mellitus without complications: Secondary | ICD-10-CM

## 2017-04-30 DIAGNOSIS — Z79899 Other long term (current) drug therapy: Secondary | ICD-10-CM

## 2017-04-30 DIAGNOSIS — K219 Gastro-esophageal reflux disease without esophagitis: Secondary | ICD-10-CM

## 2017-04-30 DIAGNOSIS — E782 Mixed hyperlipidemia: Secondary | ICD-10-CM

## 2017-04-30 DIAGNOSIS — I10 Essential (primary) hypertension: Secondary | ICD-10-CM | POA: Diagnosis not present

## 2017-04-30 DIAGNOSIS — E559 Vitamin D deficiency, unspecified: Secondary | ICD-10-CM

## 2017-04-30 NOTE — Patient Instructions (Signed)

## 2017-05-01 LAB — BASIC METABOLIC PANEL WITH GFR
BUN: 19 mg/dL (ref 7–25)
CALCIUM: 9.9 mg/dL (ref 8.6–10.3)
CO2: 31 mmol/L (ref 20–32)
CREATININE: 0.95 mg/dL (ref 0.70–1.25)
Chloride: 101 mmol/L (ref 98–110)
GFR, EST NON AFRICAN AMERICAN: 84 mL/min/{1.73_m2} (ref >=60)
GFR, Est African American: 98 mL/min/{1.73_m2} (ref >=60)
Glucose, Bld: 142 mg/dL — ABNORMAL HIGH (ref 65–99)
Potassium: 4 mmol/L (ref 3.5–5.3)
Sodium: 138 mmol/L (ref 135–146)

## 2017-05-01 LAB — CBC WITH DIFFERENTIAL/PLATELET
BASOS PCT: 1.2 %
Basophils Absolute: 89 cells/uL (ref 0–200)
EOS ABS: 311 {cells}/uL (ref 15–500)
Eosinophils Relative: 4.2 %
HCT: 38.9 % (ref 38.5–50.0)
Hemoglobin: 12.9 g/dL — ABNORMAL LOW (ref 13.2–17.1)
Lymphs Abs: 3952 cells/uL — ABNORMAL HIGH (ref 850–3900)
MCH: 29.7 pg (ref 27.0–33.0)
MCHC: 33.2 g/dL (ref 32.0–36.0)
MCV: 89.4 fL (ref 80.0–100.0)
MPV: 10.3 fL (ref 7.5–12.5)
Monocytes Relative: 5.9 %
NEUTROS PCT: 35.3 %
Neutro Abs: 2612 cells/uL (ref 1500–7800)
PLATELETS: 317 10*3/uL (ref 140–400)
RBC: 4.35 10*6/uL (ref 4.20–5.80)
RDW: 12 % (ref 11.0–15.0)
TOTAL LYMPHOCYTE: 53.4 %
WBC: 7.4 10*3/uL (ref 3.8–10.8)
WBCMIX: 437 {cells}/uL (ref 200–950)

## 2017-05-01 LAB — HEPATIC FUNCTION PANEL
AG RATIO: 1.2 (calc) (ref 1.0–2.5)
ALBUMIN MSPROF: 4.1 g/dL (ref 3.6–5.1)
ALT: 18 U/L (ref 9–46)
AST: 21 U/L (ref 10–35)
Alkaline phosphatase (APISO): 64 U/L (ref 40–115)
BILIRUBIN TOTAL: 0.5 mg/dL (ref 0.2–1.2)
Bilirubin, Direct: 0.1 mg/dL (ref 0.0–0.2)
Globulin: 3.3 g/dL (calc) (ref 1.9–3.7)
Indirect Bilirubin: 0.4 mg/dL (calc) (ref 0.2–1.2)
Total Protein: 7.4 g/dL (ref 6.1–8.1)

## 2017-05-01 LAB — HEMOGLOBIN A1C
EAG (MMOL/L): 9.7 (calc)
Hgb A1c MFr Bld: 7.7 % of total Hgb — ABNORMAL HIGH (ref <5.7)
Mean Plasma Glucose: 174 (calc)

## 2017-05-01 LAB — TSH: TSH: 1.84 mIU/L (ref 0.40–4.50)

## 2017-05-01 LAB — LIPID PANEL
CHOL/HDL RATIO: 3.6 (calc) (ref <5.0)
Cholesterol: 141 mg/dL (ref <200)
HDL: 39 mg/dL — AB (ref >40)
LDL Cholesterol (Calc): 80 mg/dL (calc)
Non-HDL Cholesterol (Calc): 102 mg/dL (calc) (ref <130)
TRIGLYCERIDES: 120 mg/dL (ref <150)

## 2017-05-01 LAB — MAGNESIUM: Magnesium: 1.8 mg/dL (ref 1.5–2.5)

## 2017-05-01 LAB — VITAMIN D 25 HYDROXY (VIT D DEFICIENCY, FRACTURES): Vit D, 25-Hydroxy: 29 ng/mL — ABNORMAL LOW (ref 30–100)

## 2017-05-16 ENCOUNTER — Other Ambulatory Visit: Payer: Self-pay | Admitting: Internal Medicine

## 2017-05-16 ENCOUNTER — Other Ambulatory Visit: Payer: Self-pay | Admitting: *Deleted

## 2017-05-16 DIAGNOSIS — I10 Essential (primary) hypertension: Secondary | ICD-10-CM

## 2017-05-16 MED ORDER — LOSARTAN POTASSIUM-HCTZ 100-25 MG PO TABS: 1.0000 | ORAL_TABLET | Freq: Every day | ORAL | 0 refills | Status: DC

## 2017-06-11 ENCOUNTER — Other Ambulatory Visit: Payer: Self-pay | Admitting: *Deleted

## 2017-06-11 DIAGNOSIS — E119 Type 2 diabetes mellitus without complications: Secondary | ICD-10-CM

## 2017-06-11 MED ORDER — METFORMIN HCL ER 500 MG PO TB24: ORAL_TABLET | ORAL | 0 refills | Status: DC

## 2017-07-04 ENCOUNTER — Other Ambulatory Visit: Payer: Self-pay | Admitting: *Deleted

## 2017-07-04 DIAGNOSIS — K580 Irritable bowel syndrome with diarrhea: Secondary | ICD-10-CM

## 2017-07-04 MED ORDER — ROSUVASTATIN CALCIUM 10 MG PO TABS: 10.0000 mg | ORAL_TABLET | Freq: Every day | ORAL | 0 refills | Status: DC

## 2017-07-04 MED ORDER — CITALOPRAM HYDROBROMIDE 40 MG PO TABS: ORAL_TABLET | ORAL | 0 refills | Status: DC

## 2017-08-05 ENCOUNTER — Encounter: Payer: Self-pay | Admitting: Internal Medicine

## 2017-08-05 NOTE — Progress Notes (Signed)
This very nice 67 y.o.  MBM presents for 6 month follow up with HTN, HLD, T2_DM and Vitamin D Deficiency. Patient has GERD controlled with meds.      Patient also has hx/o OSA but sleep studies never completed in the past due to poor insurance coverage. Patient admits poor sleep hygiene with frequent awakenings, snoring  & apnea reported by wife and has excessive somnolence. He defers evaluation at this time until he can confirm insurance coverage.      Today he also reports lower Urinary tract sx's with hesistancy, decreased stream and nocturia x 2-3.      Patient is treated for HTN (1989) & BP has been controlled at home. Today's BP is at goal - 112/76.  Patient recounts intermittant query Chest Pains as a "dull'' Lt anterior Chest discomfort with associated LUE discomfort . He also endorses DOE at low levels of activity and has a remote hx/o smoking - quit 15+ years ago.  Patient has been seen by Dr Charlton Haws in the past for cardiac evaluation. Patient has had no complaints of any palpitations, dizziness, claudication, or dependent edema.     Hyperlipidemia is controlled with diet & meds. Patient denies myalgias or other med SE's. Last Lipids were at goal: Lab Results  Component Value Date   CHOL 141 04/30/2017   HDL 39 (L) 04/30/2017   LDLCALC 80 04/30/2017   TRIG 120 04/30/2017   CHOLHDL 3.6 04/30/2017      Also, the patient has history of Morbid Obesity (BMI 321+) and consequent T2_NIDDM (2007) with CKD2 (GFR 84) and has had no symptoms of reactive hypoglycemia, diabetic polys, paresthesias or visual blurring.  Diet compliance is poor as reflected with his last A1c was not at goal: Lab Results  Component Value Date   HGBA1C 7.7 (H) 04/30/2017      Testosterone has been low ("209" and "154" in 2015 & 2016) and he has declined therapy. Further, the patient also has history of Vitamin D Deficiency ("22"/2008) and he does not  supplement vitamin D was repeated advised. Last vitamin  D was still low (goal 70-100): Lab Results  Component Value Date   VD25OH 29 (L) 04/30/2017   Current Outpatient Medications on File Prior to Visit  Medication Sig  . ALPRAZolam (XANAX) 1 MG tablet Take 1/2 to 1 tablet at hour of sleep  . aspirin 81 MG tablet Take 81 mg by mouth daily.  . Cholecalciferol (VITAMIN D3) 2000 units capsule Take 1 capsule (2,000 Units total) by mouth daily. Takes 6000 units daily  . citalopram (CELEXA) 40 MG tablet TAKE 1 TABLET BY MOUTH DAILY FOR MOOD  . losartan-hydrochlorothiazide (HYZAAR) 100-25 MG tablet Take 1 tablet by mouth daily. for blood pressure  . Magnesium 400 MG CAPS Take by mouth 2 (two) times daily.   . metFORMIN (GLUCOPHAGE-XR) 500 MG 24 hr tablet TAKE 2 TABLETS BY MOUTH TWICE DAILY WITH FOOD FOR DIABETES  . pantoprazole (PROTONIX) 40 MG tablet Take 1 tablet daily for Heartburn & Indigestion  . Probiotic Product (PROBIOTIC DAILY PO) Take by mouth daily.  . rosuvastatin (CRESTOR) 10 MG tablet Take 1 tablet (10 mg total) by mouth daily.  Marland Kitchen atenolol (TENORMIN) 100 MG tablet Take 1 tablet daily for BP  . dicyclomine (BENTYL) 20 MG tablet Take 1 tablet 3 x / day for Irritable Bowel   No current facility-administered medications on file prior to visit.    No Known Allergies   PMHx:  Past Medical History:  Diagnosis Date  . Allergy   . Anxiety    On Xanax  . Chest pain   . Fatigue   . GERD (gastroesophageal reflux disease)   . Heart murmur    hx of MVP   . HTN (hypertension)   . Hyperlipemia   . IBS (irritable bowel syndrome)   . OSA (obstructive sleep apnea)    Restated CPAP  . Sleep apnea    no cpap   . Type II or unspecified type diabetes mellitus without mention of complication, not stated as uncontrolled   . Vitamin D deficiency    Immunization History  Administered Date(s) Administered  . Influenza Inj Mdck Quad With Preservative 01/29/2017  . Influenza Split 03/01/2015  . Influenza,inj,quad, With Preservative 03/03/2016    . PPD Test 01/30/2014, 01/29/2017  . Pneumococcal Polysaccharide-23 05/21/2008  . Td 10/30/2003  . Tdap 01/30/2014   Past Surgical History:  Procedure Laterality Date  . COLONOSCOPY    . TONSILLECTOMY    . WISDOM TOOTH EXTRACTION     FHx:    Reviewed / unchanged  SHx:    Reviewed / unchanged  Systems Review:  Constitutional: Denies fever, chills, wt changes, headaches, insomnia, fatigue, night sweats, change in appetite. Eyes: Denies redness, blurred vision, diplopia, discharge, itchy, watery eyes.  ENT: Denies discharge, congestion, post nasal drip, epistaxis, sore throat, earache, hearing loss, dental pain, tinnitus, vertigo, sinus pain, snoring.  CV: Denies chest pain, palpitations, irregular heartbeat, syncope, dyspnea, diaphoresis, orthopnea, PND, claudication or edema. Respiratory: denies cough, dyspnea, DOE, pleurisy, hoarseness, laryngitis, wheezing.  Gastrointestinal: Denies dysphagia, odynophagia, heartburn, reflux, water brash, abdominal pain or cramps, nausea, vomiting, bloating, diarrhea, constipation, hematemesis, melena, hematochezia  or hemorrhoids. Genitourinary: Denies dysuria, frequency, urgency, nocturia, hesitancy, discharge, hematuria or flank pain. Musculoskeletal: Denies arthralgias, myalgias, stiffness, jt. swelling, pain, limping or strain/sprain.  Skin: Denies pruritus, rash, hives, warts, acne, eczema or change in skin lesion(s). Neuro: No weakness, tremor, incoordination, spasms, paresthesia or pain. Psychiatric: Denies confusion, memory loss or sensory loss. Endo: Denies change in weight, skin or hair change.  Heme/Lymph: No excessive bleeding, bruising or enlarged lymph nodes.  Physical Exam  BP 112/76   Pulse 60   Temp (!) 97.3 F (36.3 C)   Resp 18   Ht 6' (1.829 m)   Wt 235 lb (106.6 kg)   BMI 31.87 kg/m   Appears  well nourished, well groomed  and in no distress.  Eyes: PERRLA, EOMs, conjunctiva no swelling or erythema. Sinuses: No  frontal/maxillary tenderness ENT/Mouth: EAC's clear, TM's nl w/o erythema, bulging. Nares clear w/o erythema, swelling, exudates. Oropharynx clear without erythema or exudates. Oral hygiene is good. Mallampati II_III. Hearing intact.  Neck: Supple. Squat. Thyroid not palpable. Car 2+/2+ without bruits, nodes or JVD. Chest: Respirations nl with BS clear & equal w/o rales, rhonchi, wheezing or stridor.  Cor: Heart sounds normal w/ regular rate and rhythm without sig. murmurs, gallops, clicks or rubs. Peripheral pulses normal and equal  without edema.  Abdomen: Soft & bowel sounds normal. Non-tender w/o guarding, rebound, hernias, masses or organomegaly.  GU:  DRE finds Prostate 2+ smooth sl boggy. Neg Hemoccult.  Lymphatics: Unremarkable.  Musculoskeletal: Full ROM all peripheral extremities, joint stability, 5/5 strength and normal gait.  Skin: Warm, dry without exposed rashes, lesions or ecchymosis apparent.  Neuro: Cranial nerves intact, reflexes equal bilaterally. Motor testing grossly intact. Tendon reflexes grossly intact. Sensation intact to touch, vibratory and Monofilament to the toes bilaterally. Pysch:  Alert & oriented x 3.  Insight and judgement nl & appropriate. No ideations.  Assessment and Plan:  1. Essential hypertension  - Continue medication, monitor blood pressure at home.  - Continue DASH diet. Reminder to go to the ER if any CP,  SOB, nausea, dizziness, severe HA, changes vision/speech.  - CBC with Differential/Platelet - BASIC METABOLIC PANEL WITH GFR - Magnesium - TSH  2. Hyperlipidemia, mixed  - Continue diet/meds, exercise,& lifestyle modifications.  - Continue monitor periodic cholesterol/liver & renal functions   - Hepatic function panel - Lipid panel - TSH  3. Type 2 diabetes mellitus with stage 2 chronic kidney disease, without long-term current use of insulin (HCC)  - Query Chest Pain in a poorly controlled Diabetic - Refer for Cardiology  Consultation  - Continue diet, exercise,  - lifestyle modifications.  - Monitor appropriate labs.  - Hemoglobin A1c - Insulin, random  4. Vitamin D deficiency  - Continue supplementation.  - VITAMIN D 25 Hydroxyl  5. Gastroesophageal reflux disease  - CBC with Differential/Platelet  6. BPH w/ LUTS  - Rx - Flomax 0.4 mg   7. Medication management  - CBC with Differential/Platelet - BASIC METABOLIC PANEL WITH GFR - Hepatic function panel - Magnesium - Lipid panel - Hemoglobin A1c - TSH - Insulin, random - VITAMIN D 25 Hydroxyl         Discussed  regular exercise, BP monitoring, weight control to achieve/maintain BMI less than 25 and discussed med and SE's. Recommended labs to assess and monitor clinical status with further disposition pending results of labs. Over 30 minutes of exam, counseling, chart review was performed.

## 2017-08-05 NOTE — Patient Instructions (Signed)

## 2017-08-06 ENCOUNTER — Ambulatory Visit (INDEPENDENT_AMBULATORY_CARE_PROVIDER_SITE_OTHER): Payer: Managed Care, Other (non HMO) | Admitting: Internal Medicine

## 2017-08-06 VITALS — BP 112/76 | HR 60 | Temp 97.3°F | Resp 18 | Ht 72.0 in | Wt 235.0 lb

## 2017-08-06 DIAGNOSIS — G4733 Obstructive sleep apnea (adult) (pediatric): Secondary | ICD-10-CM

## 2017-08-06 DIAGNOSIS — E1122 Type 2 diabetes mellitus with diabetic chronic kidney disease: Secondary | ICD-10-CM

## 2017-08-06 DIAGNOSIS — Z79899 Other long term (current) drug therapy: Secondary | ICD-10-CM

## 2017-08-06 DIAGNOSIS — N182 Chronic kidney disease, stage 2 (mild): Secondary | ICD-10-CM

## 2017-08-06 DIAGNOSIS — I259 Chronic ischemic heart disease, unspecified: Secondary | ICD-10-CM | POA: Diagnosis not present

## 2017-08-06 DIAGNOSIS — E559 Vitamin D deficiency, unspecified: Secondary | ICD-10-CM

## 2017-08-06 DIAGNOSIS — I10 Essential (primary) hypertension: Secondary | ICD-10-CM | POA: Diagnosis not present

## 2017-08-06 DIAGNOSIS — E782 Mixed hyperlipidemia: Secondary | ICD-10-CM

## 2017-08-06 DIAGNOSIS — N401 Enlarged prostate with lower urinary tract symptoms: Secondary | ICD-10-CM | POA: Diagnosis not present

## 2017-08-06 DIAGNOSIS — K219 Gastro-esophageal reflux disease without esophagitis: Secondary | ICD-10-CM

## 2017-08-06 MED ORDER — TAMSULOSIN HCL 0.4 MG PO CAPS: ORAL_CAPSULE | ORAL | 3 refills | Status: DC

## 2017-08-07 LAB — CBC WITH DIFFERENTIAL/PLATELET
Basophils Absolute: 90 cells/uL (ref 0–200)
Basophils Relative: 1.3 %
EOS ABS: 283 {cells}/uL (ref 15–500)
Eosinophils Relative: 4.1 %
HCT: 39.5 % (ref 38.5–50.0)
Hemoglobin: 13.5 g/dL (ref 13.2–17.1)
Lymphs Abs: 3885 cells/uL (ref 850–3900)
MCH: 30.7 pg (ref 27.0–33.0)
MCHC: 34.2 g/dL (ref 32.0–36.0)
MCV: 89.8 fL (ref 80.0–100.0)
MPV: 10.2 fL (ref 7.5–12.5)
Monocytes Relative: 5.5 %
NEUTROS PCT: 32.8 %
Neutro Abs: 2263 cells/uL (ref 1500–7800)
PLATELETS: 338 10*3/uL (ref 140–400)
RBC: 4.4 10*6/uL (ref 4.20–5.80)
RDW: 12 % (ref 11.0–15.0)
TOTAL LYMPHOCYTE: 56.3 %
WBC: 6.9 10*3/uL (ref 3.8–10.8)
WBCMIX: 380 {cells}/uL (ref 200–950)

## 2017-08-07 LAB — LIPID PANEL
CHOLESTEROL: 137 mg/dL (ref <200)
HDL: 37 mg/dL — AB (ref >40)
LDL Cholesterol (Calc): 81 mg/dL (calc)
Non-HDL Cholesterol (Calc): 100 mg/dL (calc) (ref <130)
Total CHOL/HDL Ratio: 3.7 (calc) (ref <5.0)
Triglycerides: 98 mg/dL (ref <150)

## 2017-08-07 LAB — HEPATIC FUNCTION PANEL
AG Ratio: 1.4 (calc) (ref 1.0–2.5)
ALKALINE PHOSPHATASE (APISO): 63 U/L (ref 40–115)
ALT: 15 U/L (ref 9–46)
AST: 17 U/L (ref 10–35)
Albumin: 4.4 g/dL (ref 3.6–5.1)
Bilirubin, Direct: 0.1 mg/dL (ref 0.0–0.2)
Globulin: 3.1 g/dL (calc) (ref 1.9–3.7)
Indirect Bilirubin: 0.5 mg/dL (calc) (ref 0.2–1.2)
TOTAL PROTEIN: 7.5 g/dL (ref 6.1–8.1)
Total Bilirubin: 0.6 mg/dL (ref 0.2–1.2)

## 2017-08-07 LAB — TSH: TSH: 1.89 mIU/L (ref 0.40–4.50)

## 2017-08-07 LAB — BASIC METABOLIC PANEL WITH GFR
BUN: 16 mg/dL (ref 7–25)
CO2: 31 mmol/L (ref 20–32)
CREATININE: 0.99 mg/dL (ref 0.70–1.25)
Calcium: 10 mg/dL (ref 8.6–10.3)
Chloride: 99 mmol/L (ref 98–110)
GFR, Est African American: 92 mL/min/{1.73_m2} (ref >=60)
GFR, Est Non African American: 80 mL/min/{1.73_m2} (ref >=60)
GLUCOSE: 175 mg/dL — AB (ref 65–99)
POTASSIUM: 3.9 mmol/L (ref 3.5–5.3)
SODIUM: 138 mmol/L (ref 135–146)

## 2017-08-07 LAB — HEMOGLOBIN A1C
EAG (MMOL/L): 9.7 (calc)
Hgb A1c MFr Bld: 7.7 % of total Hgb — ABNORMAL HIGH (ref <5.7)
Mean Plasma Glucose: 174 (calc)

## 2017-08-07 LAB — MAGNESIUM: Magnesium: 1.8 mg/dL (ref 1.5–2.5)

## 2017-08-07 LAB — VITAMIN D 25 HYDROXY (VIT D DEFICIENCY, FRACTURES): Vit D, 25-Hydroxy: 34 ng/mL (ref 30–100)

## 2017-08-07 LAB — INSULIN, RANDOM: Insulin: 70.1 u[IU]/mL — ABNORMAL HIGH (ref 2.0–19.6)

## 2017-08-08 ENCOUNTER — Ambulatory Visit: Payer: Managed Care, Other (non HMO) | Admitting: Cardiology

## 2017-08-10 DIAGNOSIS — G4730 Sleep apnea, unspecified: Secondary | ICD-10-CM | POA: Insufficient documentation

## 2017-08-10 DIAGNOSIS — R011 Cardiac murmur, unspecified: Secondary | ICD-10-CM | POA: Insufficient documentation

## 2017-08-10 DIAGNOSIS — R079 Chest pain, unspecified: Secondary | ICD-10-CM | POA: Insufficient documentation

## 2017-08-16 NOTE — Progress Notes (Signed)
Cardiology Office Note:    Date:  08/17/2017   ID:  Wayne [name], DOB 06/25/1950, MRN [id number]  PCP:  Lucky Cowboy, MD  Cardiologist:  Norman Herrlich, MD   Referring MD: Lucky Cowboy, MD  ASSESSMENT:    1. Chest pain, unspecified type   2. Mixed hyperlipidemia   3. Essential hypertension   4. Controlled type 2 diabetes mellitus without complication, without long-term current use of insulin (HCC)    PLAN:    In order of problems listed above:  1. He is having atypical chest pain but is increased cardiovascular risk with age diabetes hypertension hyperlipidemia.  We discussed an ischemia evaluation either utilizing cardiac CTA or stress test modality.  He prefers to undergo stress test will have a stress echo performed as well as a CT calcium score to reassess his cardiovascular risk.   2. Stable continue high intensity statin 3. Stable continue current treatment beta-blocker 4. Stable continue current diabetic treatment with Metformin.  Of an additional agent was needed either agents like Victoza or SGL T2 would be preferable from a cardiovascular risk perspective  Next appointment 4 weeks   Medication Adjustments/Labs and Tests Ordered: Current medicines are reviewed at length with the patient today.  Concerns regarding medicines are outlined above.  Orders Placed This Encounter  Procedures  . EKG 12-Lead  . ECHOCARDIOGRAM STRESS TEST  . CT HEART W/O CONTRAST MEDIA - QUANT EVAL CORONARY CALCIUM   No orders of the defined types were placed in this encounter.    Chief Complaint  Patient presents with  . New Patient (Initial Visit)    per Dr Oneta Rack to evaulate chest pain radiating to left arm for a couple of months    History of Present Illness:    Wayne [name] is a 67 y.o. male with t2 DM, hypertension, hyperlipidemia and sleep apnea who is being seen today for the evaluation of chest pain at the request of Lucky Cowboy, MD. He was seen by Dr Eden Emms in  2014 for palpitation. A cardiac echo at that time was normal. He is unsure if he is having heart problems.  He finds himself under increased stress with the death of his mother and another relative and handling of the states.  He notices without physical effort he gets an aching burning in the left arm shoulder elbow that comes and goes last for a few minutes and occurs daily.  Unrelated to position activity resolve spontaneously it is never been severe or limiting his activity but he also gets the same feeling in his left chest at times.  He has no exercise intolerance exertional chest pain shortness of breath palpitation or syncope he has no history of chest wall trauma and is having no shoulder or neck pain.  Because of his diabetes and increased cardiovascular risk he is concerned and sought attention.  He has no history of congenital rheumatic heart disease was told he had mitral valve prolapse in the past but had a normal echocardiogram in 2014. Past Medical History:  Diagnosis Date  . Allergy   . Anxiety    On Xanax  . Chest pain   . Fatigue   . GERD (gastroesophageal reflux disease)   . Heart murmur    hx of MVP   . HTN (hypertension)   . Hyperlipemia   . IBS (irritable bowel syndrome)   . Medication management 06/13/2013  . Mitral valve prolapse   . Obesity (BMI 30.0-34.9) 08/20/2015  . OSA (obstructive  sleep apnea)    Restated CPAP  . Sleep apnea    no cpap   . T2_NIDDM   . Testosterone deficiency 08/20/2015  . Type II or unspecified type diabetes mellitus without mention of complication, not stated as uncontrolled   . Vitamin D deficiency     Past Surgical History:  Procedure Laterality Date  . COLONOSCOPY    . TONSILLECTOMY    . WISDOM TOOTH EXTRACTION      Current Medications: Current Meds  Medication Sig  . ALPRAZolam (XANAX) 1 MG tablet Take 1/2 to 1 tablet at hour of sleep  . aspirin 81 MG tablet Take 81 mg by mouth daily.  Marland Kitchen atenolol (TENORMIN) 100 MG tablet Take 1  tablet daily for BP  . Cholecalciferol (VITAMIN D3) 2000 units capsule Take 1 capsule (2,000 Units total) by mouth daily. Takes 6000 units daily  . citalopram (CELEXA) 40 MG tablet TAKE 1 TABLET BY MOUTH DAILY FOR MOOD  . losartan-hydrochlorothiazide (HYZAAR) 100-25 MG tablet Take 1 tablet by mouth daily. for blood pressure  . Magnesium 400 MG CAPS Take by mouth 2 (two) times daily.   . metFORMIN (GLUCOPHAGE-XR) 500 MG 24 hr tablet TAKE 2 TABLETS BY MOUTH TWICE DAILY WITH FOOD FOR DIABETES  . pantoprazole (PROTONIX) 40 MG tablet Take 1 tablet daily for Heartburn & Indigestion  . Probiotic Product (PROBIOTIC DAILY PO) Take by mouth daily.  . rosuvastatin (CRESTOR) 10 MG tablet Take 1 tablet (10 mg total) by mouth daily.     Allergies:   Patient has no known allergies.   Social History   Socioeconomic History  . Marital status: Married    Spouse name: Not on file  . Number of children: Not on file  . Years of education: Not on file  . Highest education level: Not on file  Occupational History  . Not on file  Social Needs  . Financial resource strain: Not on file  . Food insecurity:    Worry: Not on file    Inability: Not on file  . Transportation needs:    Medical: Not on file    Non-medical: Not on file  Tobacco Use  . Smoking status: Former Smoker    Last attempt to quit: 09/06/2007    Years since quitting: 9.9  . Smokeless tobacco: Never Used  Substance and Sexual Activity  . Alcohol use: Yes    Comment: occasional  . Drug use: No  . Sexual activity: Not on file  Lifestyle  . Physical activity:    Days per week: Not on file    Minutes per session: Not on file  . Stress: Not on file  Relationships  . Social connections:    Talks on phone: Not on file    Gets together: Not on file    Attends religious service: Not on file    Active member of club or organization: Not on file    Attends meetings of clubs or organizations: Not on file    Relationship status: Not on  file  Other Topics Concern  . Not on file  Social History Narrative  . Not on file     Family History: The patient's family history includes Alzheimer's disease in his mother; Colon cancer (age of onset: 80) in his father; Dementia in his mother; Diabetes in his unknown relative; Hyperlipidemia in his unknown relative; Hypertension in his father and unknown relative; Stroke in his unknown relative. There is no history of Colon polyps, Esophageal cancer, Rectal  cancer, or Stomach cancer.  ROS:   Review of Systems  Constitution: Negative.  HENT: Negative.   Eyes: Negative.   Cardiovascular: Positive for chest pain.  Respiratory: Negative.   Endocrine: Negative.   Hematologic/Lymphatic: Negative.   Skin: Negative.   Musculoskeletal: Negative.   Gastrointestinal: Positive for heartburn.  Genitourinary: Negative.   Neurological: Negative.   Psychiatric/Behavioral: Negative.   Allergic/Immunologic: Negative.    Please see the history of present illness.     All other systems reviewed and are negative.  EKGs/Labs/Other Studies Reviewed:    The following studies were reviewed today:   EKG:  EKG is  ordered today.  The ekg ordered today demonstrates sinus rhythm normal  Recent Labs: 08/06/2017: ALT 15; BUN 16; Creat 0.99; Hemoglobin 13.5; Magnesium 1.8; Platelets 338; Potassium 3.9; Sodium 138; TSH 1.89  Recent Lipid Panel    Component Value Date/Time   CHOL 137 08/06/2017 1443   TRIG 98 08/06/2017 1443   HDL 37 (L) 08/06/2017 1443   CHOLHDL 3.7 08/06/2017 1443   VLDL 28 10/16/2016 1704   LDLCALC 81 08/06/2017 1443    Physical Exam:    VS:  BP 110/74 (BP Location: Left Arm, Patient Position: Sitting, Cuff Size: Large)   Pulse 68   Ht 6' (1.829 m)   Wt 228 lb 12.8 oz (103.8 kg)   SpO2 97%   BMI 31.03 kg/m     Wt Readings from Last 3 Encounters:  08/17/17 228 lb 12.8 oz (103.8 kg)  08/06/17 235 lb (106.6 kg)  04/30/17 239 lb (108.4 kg)     GEN:  Well nourished,  well developed in no acute distress HEENT: Normal NECK: No JVD; No carotid bruits LYMPHATICS: No lymphadenopathy CARDIAC: He has no chest wall tenderness  RRR, no murmurs, rubs, gallops RESPIRATORY:  Clear to auscultation without rales, wheezing or rhonchi  ABDOMEN: Soft, non-tender, non-distended MUSCULOSKELETAL:  No edema; No deformity  SKIN: Warm and dry NEUROLOGIC:  Alert and oriented x 3 PSYCHIATRIC:  Normal affect     Signed, Norman HerrlichBrian Maisie Hauser, MD  08/17/2017 11:50 AM    [facility]

## 2017-08-17 ENCOUNTER — Encounter: Payer: Self-pay | Admitting: Cardiology

## 2017-08-17 ENCOUNTER — Ambulatory Visit (INDEPENDENT_AMBULATORY_CARE_PROVIDER_SITE_OTHER): Payer: Managed Care, Other (non HMO) | Admitting: Cardiology

## 2017-08-17 VITALS — BP 110/74 | HR 68 | Ht 72.0 in | Wt 228.8 lb

## 2017-08-17 DIAGNOSIS — R079 Chest pain, unspecified: Secondary | ICD-10-CM

## 2017-08-17 DIAGNOSIS — E119 Type 2 diabetes mellitus without complications: Secondary | ICD-10-CM | POA: Diagnosis not present

## 2017-08-17 DIAGNOSIS — I10 Essential (primary) hypertension: Secondary | ICD-10-CM

## 2017-08-17 DIAGNOSIS — E782 Mixed hyperlipidemia: Secondary | ICD-10-CM | POA: Diagnosis not present

## 2017-08-17 NOTE — Patient Instructions (Signed)
Medication Instructions:  Your physician recommends that you continue on your current medications as directed. Please refer to the Current Medication list given to you today.  Labwork: None  Testing/Procedures: You had an EKG today.  Your physician has requested that you have a stress echocardiogram. For further information please visit [email or web address] Please follow instruction sheet as given.  Your physician has requested that you have cardiac CT with calcium score. Cardiac computed tomography (CT) is a painless test that uses an x-Elmus machine to take clear, detailed pictures of your heart. For further information please visit [email or web address] Please follow instruction sheet as given.  Follow-Up: Your physician recommends that you schedule a follow-up appointment in: 4 weeks.  Any Other Special Instructions Will Be Listed Below (If Applicable).     If you need a refill on your cardiac medications before your next appointment, please call your pharmacy.

## 2017-08-20 ENCOUNTER — Other Ambulatory Visit: Payer: Self-pay | Admitting: Cardiology

## 2017-08-20 DIAGNOSIS — R079 Chest pain, unspecified: Secondary | ICD-10-CM

## 2017-08-20 NOTE — Addendum Note (Signed)
Addended by: Ayesha MohairWELLS, MICHAELA E on: 08/20/2017 11:52 AM   Modules accepted: Orders

## 2017-09-04 ENCOUNTER — Inpatient Hospital Stay: Admission: RE | Admit: 2017-09-04 | Payer: Managed Care, Other (non HMO) | Source: Ambulatory Visit

## 2017-09-05 ENCOUNTER — Other Ambulatory Visit: Payer: Self-pay | Admitting: Internal Medicine

## 2017-09-05 DIAGNOSIS — I10 Essential (primary) hypertension: Secondary | ICD-10-CM

## 2017-09-11 ENCOUNTER — Ambulatory Visit (HOSPITAL_BASED_OUTPATIENT_CLINIC_OR_DEPARTMENT_OTHER): Payer: Managed Care, Other (non HMO)

## 2017-09-14 ENCOUNTER — Other Ambulatory Visit: Payer: Managed Care, Other (non HMO)

## 2017-09-19 ENCOUNTER — Other Ambulatory Visit: Payer: Self-pay | Admitting: *Deleted

## 2017-09-19 MED ORDER — GLUCOSE BLOOD VI STRP: ORAL_STRIP | 4 refills | Status: DC

## 2017-09-19 MED ORDER — ONETOUCH VERIO W/DEVICE KIT: 1.0000 | PACK | Freq: Every day | 0 refills | Status: DC

## 2017-09-21 ENCOUNTER — Other Ambulatory Visit: Payer: Self-pay | Admitting: *Deleted

## 2017-09-21 MED ORDER — GLUCOSE BLOOD VI STRP: ORAL_STRIP | 4 refills | Status: DC

## 2017-09-23 NOTE — Progress Notes (Deleted)
Cardiology Office Note:    Date:  09/23/2017   ID:  Wayne [name], DOB 07-21-1950, MRN [id number]  PCP:  Lucky Cowboy, MD  Cardiologist:  Norman Herrlich, MD    Referring MD: Lucky Cowboy, MD    ASSESSMENT:    No diagnosis found. PLAN:    In order of problems listed above:  1. ***   Next appointment: ***   Medication Adjustments/Labs and Tests Ordered: Current medicines are reviewed at length with the patient today.  Concerns regarding medicines are outlined above.  No orders of the defined types were placed in this encounter.  No orders of the defined types were placed in this encounter.   No chief complaint on file.   History of Present Illness:    Wayne [name] is a 67 y.o. male with a hx of t2 DM, hypertension, hyperlipidemia and sleep apnea  last seen 08/17/17 for chest pain.   ASSESSMENT:    08/17/17   1. Chest pain, unspecified type   2. Mixed hyperlipidemia   3. Essential hypertension   4. Controlled type 2 diabetes mellitus without complication, without long-term current use of insulin (HCC)    PLAN:    1.    He is having atypical chest pain but is increased cardiovascular risk with age diabetes hypertension hyperlipidemia.  We discussed an ischemia evaluation either utilizing cardiac CTA or stress test modality.  He prefers to undergo stress test will have a stress echo performed as well as a CT calcium score to reassess his cardiovascular risk.   2. Stable continue high intensity statin 3. Stable continue current treatment beta-blocker 4. Stable continue current diabetic treatment with Metformin.  Of an additional agent was needed either agents like Victoza or SGL T2 would be preferable from a cardiovascular risk perspective  Compliance with diet, lifestyle and medications: *** Past Medical History:  Diagnosis Date  . Allergy   . Anxiety    On Xanax  . Chest pain   . Fatigue   . GERD (gastroesophageal reflux disease)   . Heart murmur     hx of MVP   . HTN (hypertension)   . Hyperlipemia   . IBS (irritable bowel syndrome)   . Medication management 06/13/2013  . Mitral valve prolapse   . Obesity (BMI 30.0-34.9) 08/20/2015  . OSA (obstructive sleep apnea)    Restated CPAP  . Sleep apnea    no cpap   . T2_NIDDM   . Testosterone deficiency 08/20/2015  . Type II or unspecified type diabetes mellitus without mention of complication, not stated as uncontrolled   . Vitamin D deficiency     Past Surgical History:  Procedure Laterality Date  . COLONOSCOPY    . TONSILLECTOMY    . WISDOM TOOTH EXTRACTION      Current Medications: No outpatient medications have been marked as taking for the 09/24/17 encounter (Appointment) with Baldo Daub, MD.     Allergies:   Patient has no known allergies.   Social History   Socioeconomic History  . Marital status: Married    Spouse name: Not on file  . Number of children: Not on file  . Years of education: Not on file  . Highest education level: Not on file  Occupational History  . Not on file  Social Needs  . Financial resource strain: Not on file  . Food insecurity:    Worry: Not on file    Inability: Not on file  . Transportation needs:  Medical: Not on file    Non-medical: Not on file  Tobacco Use  . Smoking status: Former Smoker    Last attempt to quit: 09/06/2007    Years since quitting: 10.0  . Smokeless tobacco: Never Used  Substance and Sexual Activity  . Alcohol use: Yes    Comment: occasional  . Drug use: No  . Sexual activity: Not on file  Lifestyle  . Physical activity:    Days per week: Not on file    Minutes per session: Not on file  . Stress: Not on file  Relationships  . Social connections:    Talks on phone: Not on file    Gets together: Not on file    Attends religious service: Not on file    Active member of club or organization: Not on file    Attends meetings of clubs or organizations: Not on file    Relationship status: Not on file    Other Topics Concern  . Not on file  Social History Narrative  . Not on file     Family History: The patient's ***family history includes Alzheimer's disease in his mother; Colon cancer (age of onset: 44) in his father; Dementia in his mother; Diabetes in his unknown relative; Hyperlipidemia in his unknown relative; Hypertension in his father and unknown relative; Stroke in his unknown relative. There is no history of Colon polyps, Esophageal cancer, Rectal cancer, or Stomach cancer. ROS:   Please see the history of present illness.    All other systems reviewed and are negative.  EKGs/Labs/Other Studies Reviewed:    The following studies were reviewed today:  EKG:  EKG ordered today.  The ekg ordered today demonstrates ***  Recent Labs: 08/06/2017: ALT 15; BUN 16; Creat 0.99; Hemoglobin 13.5; Magnesium 1.8; Platelets 338; Potassium 3.9; Sodium 138; TSH 1.89  Recent Lipid Panel    Component Value Date/Time   CHOL 137 08/06/2017 1443   TRIG 98 08/06/2017 1443   HDL 37 (L) 08/06/2017 1443   CHOLHDL 3.7 08/06/2017 1443   VLDL 28 10/16/2016 1704   LDLCALC 81 08/06/2017 1443    Physical Exam:    VS:  There were no vitals taken for this visit.    Wt Readings from Last 3 Encounters:  08/17/17 228 lb 12.8 oz (103.8 kg)  08/06/17 235 lb (106.6 kg)  04/30/17 239 lb (108.4 kg)     GEN: *** Well nourished, well developed in no acute distress HEENT: Normal NECK: No JVD; No carotid bruits LYMPHATICS: No lymphadenopathy CARDIAC: ***RRR, no murmurs, rubs, gallops RESPIRATORY:  Clear to auscultation without rales, wheezing or rhonchi  ABDOMEN: Soft, non-tender, non-distended MUSCULOSKELETAL:  No edema; No deformity  SKIN: Warm and dry NEUROLOGIC:  Alert and oriented x 3 PSYCHIATRIC:  Normal affect    Signed, Norman Herrlich, MD  09/23/2017 8:09 PM    [facility]

## 2017-09-24 ENCOUNTER — Other Ambulatory Visit: Payer: Self-pay | Admitting: *Deleted

## 2017-09-24 ENCOUNTER — Ambulatory Visit: Payer: Managed Care, Other (non HMO) | Admitting: Cardiology

## 2017-09-24 MED ORDER — GLUCOSE BLOOD VI STRP: ORAL_STRIP | 4 refills | Status: DC

## 2017-11-16 NOTE — Progress Notes (Signed)
FOLLOW UP  Assessment and Plan:   Hypertension Well controlled with current medications; on ARB Monitor blood pressure at home; patient to call if consistently greater than 130/80 Continue DASH diet.   Reminder to go to the ER if any CP, SOB, nausea, dizziness, severe HA, changes vision/speech, left arm numbness and tingling and jaw pain.  Cholesterol Continue statin medication Continue low cholesterol diet and exercise.  Check lipid panel.   Diabetes without complications Continue medication: metformin  Continue diet and exercise.  Perform daily foot/skin check, notify office of any concerning changes.  Check A1C  GERD Well managed on current medications Discussed risks/benefits of long term PPI use and recommended taper as tolerated Discussed diet, avoiding triggers and other lifestyle changes  Anxiety Well managed by current regimen;  Stress management techniques discussed, increase water, good sleep hygiene discussed, increase exercise, and increase veggies.   Obesity with co morbidities Long discussion about weight loss, diet, and exercise Discussed ideal weight for height and initial weight goal Patient will work on portion sizes and gentle exercise Will follow up in 3 months  Vitamin D Def/ osteoporosis prevention Continue supplementation; increased dose as indicated for goal of 70-100 Defer Vit D level per pt req   Continue diet and meds as discussed. Further disposition pending results of labs. Discussed med's effects and SE's.   Over 30 minutes of exam, counseling, chart review, and critical decision making was performed.   Future Appointments  Date Time Provider [place]  02/19/2018  3:00 PM Unk Pinto, MD [facility] None    ----------------------------------------------------------------------------------------------------------------------  HPI 67 y.o. male  presents for 3 month follow up on hypertension, cholesterol, T2 diabetes treated  by oral antidiabetic agents, GERD, obesity, anxiety and vitamin D deficiency. He recently had colonoscopy in November - results reviewed with him. He continues to follow with Dr. Jerene Bears with neurosurgery for his back pain with L sided sciatica; discussing surgery for L4-5 disc degeneration but patient would like to avoid if possible. He recently was evaluated by Dr. Shirlee More for atypical chest pain and is pending stress test, stress ECHO and CT calcium score but is waiting on his deductible to be met, anticipates this fall. He reports no further episodes, reports he thinks was stress related.   he has a diagnosis of anxiety and is currently prescribed celexa 40 mg daily, reports symptoms are well controlled on current regimen.   BMI is Body mass index is 30.79 kg/m., he has been working on diet and exercises intermittently- somewhat limited recently by back pain and work schedule. Wt Readings from Last 3 Encounters:  11/19/17 227 lb (103 kg)  08/17/17 228 lb 12.8 oz (103.8 kg)  08/06/17 235 lb (106.6 kg)   he has a diagnosis of GERD which is currently managed by protonix 40 mg daily; at the last visit patient had reported poor control of symptoms despite use of OTC ranitidine/famodipine. he reports symptoms is currently well controlled, and denies breakthrough reflux, burning in chest, hoarseness or cough.    His blood pressure has been controlled at home, today their BP is BP: 116/78  He does not workout. He denies chest pain, shortness of breath, dizziness.   He is on cholesterol medication and denies myalgias. His cholesterol is at goal. The cholesterol last visit was:   Lab Results  Component Value Date   CHOL 137 08/06/2017   HDL 37 (L) 08/06/2017   LDLCALC 81 08/06/2017   TRIG 98 08/06/2017   CHOLHDL 3.7 08/06/2017  He has been working on diet and exercise for T2 diabetes (on metformin 2000 mg daily), and denies increased appetite, nausea, paresthesia of the feet, polydipsia,  polyuria, visual disturbances and vomiting. Last A1C in the office was:  Lab Results  Component Value Date   HGBA1C 7.7 (H) 08/06/2017   Patient is on Vitamin D supplement but remained below goal at the last visit:    Lab Results  Component Value Date   VD25OH 34 08/06/2017       Current Medications:  Current Outpatient Medications on File Prior to Visit  Medication Sig  . aspirin 81 MG tablet Take 81 mg by mouth daily.  Marland Kitchen atenolol (TENORMIN) 100 MG tablet Take 1 tablet daily for BP  . Blood Glucose Monitoring Suppl (ONETOUCH VERIO) w/Device KIT 1 kit by Does not apply route daily.  . Cholecalciferol (VITAMIN D3) 2000 units capsule Take 1 capsule (2,000 Units total) by mouth daily. Takes 6000 units daily  . citalopram (CELEXA) 40 MG tablet TAKE 1 TABLET BY MOUTH DAILY FOR MOOD  . glucose blood (ONETOUCH VERIO) test strip Check blood sugar 1 time a day-[id number].  . losartan-hydrochlorothiazide (HYZAAR) 100-25 MG tablet TAKE 1 TABLET BY MOUTH DAILY FOR BLOOD PRESSURE  . Magnesium 400 MG CAPS Take by mouth daily.   . metFORMIN (GLUCOPHAGE-XR) 500 MG 24 hr tablet TAKE 2 TABLETS BY MOUTH TWICE DAILY WITH FOOD FOR DIABETES  . pantoprazole (PROTONIX) 40 MG tablet Take 1 tablet daily for Heartburn & Indigestion  . Probiotic Product (PROBIOTIC DAILY PO) Take by mouth daily.  . rosuvastatin (CRESTOR) 10 MG tablet Take 1 tablet (10 mg total) by mouth daily.   No current facility-administered medications on file prior to visit.      Allergies: No Known Allergies   Medical History:  Past Medical History:  Diagnosis Date  . Allergy   . Anxiety    On Xanax  . Chest pain   . Fatigue   . GERD (gastroesophageal reflux disease)   . Heart murmur    hx of MVP   . HTN (hypertension)   . Hyperlipemia   . IBS (irritable bowel syndrome)   . Medication management 06/13/2013  . Mitral valve prolapse   . Obesity (BMI 30.0-34.9) 08/20/2015  . OSA (obstructive sleep apnea)    Restated CPAP  .  Sleep apnea    no cpap   . T2_NIDDM   . Testosterone deficiency 08/20/2015  . Type II or unspecified type diabetes mellitus without mention of complication, not stated as uncontrolled   . Vitamin D deficiency    Family history- Reviewed and unchanged Social history- Reviewed and unchanged   Review of Systems:  Review of Systems  Constitutional: Negative for malaise/fatigue and weight loss.  HENT: Negative for hearing loss and tinnitus.   Eyes: Negative for blurred vision and double vision.  Respiratory: Negative for cough, shortness of breath and wheezing.   Cardiovascular: Negative for chest pain, palpitations, orthopnea, claudication and leg swelling.  Gastrointestinal: Negative for abdominal pain, blood in stool, constipation, diarrhea, heartburn, melena, nausea and vomiting.  Genitourinary: Negative.   Musculoskeletal: Negative for joint pain and myalgias.  Skin: Negative for rash.  Neurological: Negative for dizziness, tingling, sensory change, weakness and headaches.  Endo/Heme/Allergies: Negative for polydipsia.  Psychiatric/Behavioral: Negative for depression and substance abuse. The patient is nervous/anxious (Rare, after bad day at work) and has insomnia (Rare, related to anxiety/stress).   All other systems reviewed and are negative.   Physical Exam: BP  116/78   Pulse 84   Temp (!) 97.3 F (36.3 C)   Ht 6' (1.829 m)   Wt 227 lb (103 kg)   SpO2 92%   BMI 30.79 kg/m  Wt Readings from Last 3 Encounters:  11/19/17 227 lb (103 kg)  08/17/17 228 lb 12.8 oz (103.8 kg)  08/06/17 235 lb (106.6 kg)   General Appearance: Well nourished, in no apparent distress. Eyes: PERRLA, EOMs, conjunctiva no swelling or erythema Sinuses: No Frontal/maxillary tenderness ENT/Mouth: Ext aud canals clear, TMs without erythema, bulging. No erythema, swelling, or exudate on post pharynx.  Tonsils not swollen or erythematous. Hearing normal.  Neck: Supple, thyroid normal.  Respiratory:  Respiratory effort normal, BS equal bilaterally without rales, rhonchi, wheezing or stridor.  Cardio: RRR with no MRGs. Brisk peripheral pulses without edema.  Abdomen: Soft, + BS.  Non tender, no guarding, rebound, hernias, masses. Lymphatics: Non tender without lymphadenopathy.  Musculoskeletal: Full ROM, 5/5 strength, Normal gait Skin: Warm, dry without rashes, lesions, ecchymosis.  Neuro: Cranial nerves intact. No cerebellar symptoms.  Psych: Awake and oriented X 3, normal affect, Insight and Judgment appropriate.    Izora Ribas, NP 4:20 PM [facility]

## 2017-11-19 ENCOUNTER — Ambulatory Visit (INDEPENDENT_AMBULATORY_CARE_PROVIDER_SITE_OTHER): Payer: Managed Care, Other (non HMO) | Admitting: Adult Health

## 2017-11-19 ENCOUNTER — Encounter: Payer: Self-pay | Admitting: Adult Health

## 2017-11-19 VITALS — BP 116/78 | HR 84 | Temp 97.3°F | Ht 72.0 in | Wt 227.0 lb

## 2017-11-19 DIAGNOSIS — Z79899 Other long term (current) drug therapy: Secondary | ICD-10-CM

## 2017-11-19 DIAGNOSIS — I10 Essential (primary) hypertension: Secondary | ICD-10-CM

## 2017-11-19 DIAGNOSIS — E559 Vitamin D deficiency, unspecified: Secondary | ICD-10-CM | POA: Diagnosis not present

## 2017-11-19 DIAGNOSIS — K219 Gastro-esophageal reflux disease without esophagitis: Secondary | ICD-10-CM

## 2017-11-19 DIAGNOSIS — E66811 Obesity, class 1: Secondary | ICD-10-CM

## 2017-11-19 DIAGNOSIS — E782 Mixed hyperlipidemia: Secondary | ICD-10-CM

## 2017-11-19 DIAGNOSIS — E119 Type 2 diabetes mellitus without complications: Secondary | ICD-10-CM

## 2017-11-19 DIAGNOSIS — E669 Obesity, unspecified: Secondary | ICD-10-CM | POA: Diagnosis not present

## 2017-11-19 DIAGNOSIS — F419 Anxiety disorder, unspecified: Secondary | ICD-10-CM | POA: Diagnosis not present

## 2017-11-19 NOTE — Patient Instructions (Signed)
Maintain a healthy weight (BMI <25)  Aim for 7+ servings of fruits and vegetables daily  80+ fluid ounces of water or unsweet tea for healthy kidneys  Limit to max 1-2 drinks per day, avoid smoking  Limit animal fats in diet for cholesterol and heart health - choose grass fed whenever available  Aim for low stress - take time to unwind and care for your mental health  Aim for 150 min of moderate intensity exercise weekly for heart health, and weights twice weekly for bone health  Aim for 7-9 hours of sleep daily      When it comes to diets, agreement about the perfect plan isn't easy to find, even among the experts. Experts at the [employer] developed an idea known as the Healthy Eating Plate. Just imagine a plate divided into logical, healthy portions.  The emphasis is on diet quality:  Load up on vegetables and fruits - one-half of your plate: Aim for color and variety, and remember that potatoes don't count.  Go for whole grains - one-quarter of your plate: Whole wheat, barley, wheat berries, quinoa, oats, brown rice, and foods made with them. If you want pasta, go with whole wheat pasta.  Protein power - one-quarter of your plate: Fish, chicken, beans, and nuts are all healthy, versatile protein sources. Limit red meat.  The diet, however, does go beyond the plate, offering a few other suggestions.  Use healthy plant oils, such as olive, canola, soy, corn, sunflower and peanut. Check the labels, and avoid partially hydrogenated oil, which have unhealthy trans fats.  If you're thirsty, drink water. Coffee and tea are good in moderation, but skip sugary drinks and limit milk and dairy products to one or two daily servings.  The type of carbohydrate in the diet is more important than the amount. Some sources of carbohydrates, such as vegetables, fruits, whole grains, and beans-are healthier than others.  Finally, stay active.

## 2017-11-20 LAB — COMPLETE METABOLIC PANEL WITH GFR
AG Ratio: 1.4 (calc) (ref 1.0–2.5)
ALKALINE PHOSPHATASE (APISO): 68 U/L (ref 40–115)
ALT: 17 U/L (ref 9–46)
AST: 19 U/L (ref 10–35)
Albumin: 4.5 g/dL (ref 3.6–5.1)
BILIRUBIN TOTAL: 0.6 mg/dL (ref 0.2–1.2)
BUN: 20 mg/dL (ref 7–25)
CO2: 28 mmol/L (ref 20–32)
Calcium: 10.3 mg/dL (ref 8.6–10.3)
Chloride: 101 mmol/L (ref 98–110)
Creat: 0.99 mg/dL (ref 0.70–1.25)
GFR, Est African American: 92 mL/min/{1.73_m2} (ref >=60)
GFR, Est Non African American: 80 mL/min/{1.73_m2} (ref >=60)
GLUCOSE: 99 mg/dL (ref 65–99)
Globulin: 3.3 g/dL (calc) (ref 1.9–3.7)
Potassium: 4.2 mmol/L (ref 3.5–5.3)
Sodium: 140 mmol/L (ref 135–146)
TOTAL PROTEIN: 7.8 g/dL (ref 6.1–8.1)

## 2017-11-20 LAB — CBC WITH DIFFERENTIAL/PLATELET
BASOS PCT: 1.3 %
Basophils Absolute: 101 cells/uL (ref 0–200)
EOS ABS: 281 {cells}/uL (ref 15–500)
Eosinophils Relative: 3.6 %
HCT: 38.9 % (ref 38.5–50.0)
Hemoglobin: 13.2 g/dL (ref 13.2–17.1)
Lymphs Abs: 3884 cells/uL (ref 850–3900)
MCH: 30.2 pg (ref 27.0–33.0)
MCHC: 33.9 g/dL (ref 32.0–36.0)
MCV: 89 fL (ref 80.0–100.0)
MONOS PCT: 7 %
MPV: 10.4 fL (ref 7.5–12.5)
NEUTROS PCT: 38.3 %
Neutro Abs: 2987 cells/uL (ref 1500–7800)
PLATELETS: 308 10*3/uL (ref 140–400)
RBC: 4.37 10*6/uL (ref 4.20–5.80)
RDW: 12.2 % (ref 11.0–15.0)
TOTAL LYMPHOCYTE: 49.8 %
WBC: 7.8 10*3/uL (ref 3.8–10.8)
WBCMIX: 546 {cells}/uL (ref 200–950)

## 2017-11-20 LAB — LIPID PANEL
CHOL/HDL RATIO: 3.6 (calc) (ref <5.0)
Cholesterol: 142 mg/dL (ref <200)
HDL: 40 mg/dL — AB (ref >40)
LDL Cholesterol (Calc): 81 mg/dL (calc)
NON-HDL CHOLESTEROL (CALC): 102 mg/dL (ref <130)
TRIGLYCERIDES: 109 mg/dL (ref <150)

## 2017-11-20 LAB — HEMOGLOBIN A1C
HEMOGLOBIN A1C: 6.7 %{Hb} — AB (ref <5.7)
MEAN PLASMA GLUCOSE: 146 (calc)
eAG (mmol/L): 8.1 (calc)

## 2017-11-20 LAB — TSH: TSH: 4.07 mIU/L (ref 0.40–4.50)

## 2017-11-20 LAB — MAGNESIUM: MAGNESIUM: 1.8 mg/dL (ref 1.5–2.5)

## 2017-12-17 ENCOUNTER — Other Ambulatory Visit: Payer: Self-pay | Admitting: Internal Medicine

## 2017-12-17 ENCOUNTER — Other Ambulatory Visit: Payer: Self-pay | Admitting: *Deleted

## 2017-12-17 DIAGNOSIS — I10 Essential (primary) hypertension: Secondary | ICD-10-CM

## 2017-12-17 MED ORDER — LOSARTAN POTASSIUM-HCTZ 100-25 MG PO TABS: 1.0000 | ORAL_TABLET | Freq: Every day | ORAL | 1 refills | Status: DC

## 2018-01-17 ENCOUNTER — Other Ambulatory Visit: Payer: Self-pay | Admitting: Internal Medicine

## 2018-01-17 DIAGNOSIS — E782 Mixed hyperlipidemia: Secondary | ICD-10-CM

## 2018-01-17 MED ORDER — ROSUVASTATIN CALCIUM 10 MG PO TABS: ORAL_TABLET | ORAL | 0 refills | Status: DC

## 2018-01-24 ENCOUNTER — Other Ambulatory Visit: Payer: Self-pay | Admitting: Internal Medicine

## 2018-01-24 DIAGNOSIS — E119 Type 2 diabetes mellitus without complications: Secondary | ICD-10-CM

## 2018-02-18 ENCOUNTER — Encounter: Payer: Self-pay | Admitting: Internal Medicine

## 2018-02-18 NOTE — Progress Notes (Signed)
      R  E  S  C  H  E  D  U  L  E  D   M  O  R  N  I  N  G   o f    A  P  P ' T                                                                This very nice 67 y.o. MBM presents for a Screening /Preventative Visit & comprehensive evaluation and management of multiple medical co-morbidities.  Patient has been followed for HTN, HLD, T2_NIDDM  and Vitamin D Deficiency. Patient has GERD controlled with his meds.     HTN predates circa 1989. Patient's BP has been controlled at home.  Today's  . Patient denies any cardiac symptoms as chest pain, palpitations, shortness of breath, dizziness or ankle swelling.     Patient's hyperlipidemia is controlled with diet and medications. Patient denies myalgias or other medication SE's. Last lipids were at goal: Lab Results  Component Value Date   CHOL 142 11/19/2017   HDL 40 (L) 11/19/2017   LDLCALC 81 11/19/2017   TRIG 109 11/19/2017   CHOLHDL 3.6 11/19/2017      Patient has hx/o Morbid Obesity (BMI 31+) and T2_NIDDM (2007) with CKD2 (GFR 84) and patient denies reactive hypoglycemic symptoms, visual blurring, diabetic polys or paresthesias. Last A1c was not at goal: Lab Results  Component Value Date   HGBA1C 6.7 (H) 11/19/2017       Patient has hx/o Testosterone Deficiency levels "209"/2015 and "154"/2016) and he has deferred replacement therapy for perceived lack of need.      Finally, patient has history of Vitamin D Deficiency  ("22"/2008) and last vitamin D was still low: Lab Results  Component Value Date   VD25OH 34 08/06/2017

## 2018-02-19 ENCOUNTER — Ambulatory Visit: Payer: Self-pay | Admitting: Internal Medicine

## 2018-03-19 ENCOUNTER — Ambulatory Visit (INDEPENDENT_AMBULATORY_CARE_PROVIDER_SITE_OTHER): Payer: Managed Care, Other (non HMO) | Admitting: Internal Medicine

## 2018-03-19 ENCOUNTER — Encounter: Payer: Self-pay | Admitting: Internal Medicine

## 2018-03-19 VITALS — BP 124/84 | HR 83 | Temp 97.4°F | Resp 16 | Ht 72.0 in | Wt 235.6 lb

## 2018-03-19 DIAGNOSIS — Z79899 Other long term (current) drug therapy: Secondary | ICD-10-CM

## 2018-03-19 DIAGNOSIS — Z8249 Family history of ischemic heart disease and other diseases of the circulatory system: Secondary | ICD-10-CM | POA: Insufficient documentation

## 2018-03-19 DIAGNOSIS — Z130 Encounter for screening for diseases of the blood and blood-forming organs and certain disorders involving the immune mechanism: Secondary | ICD-10-CM

## 2018-03-19 DIAGNOSIS — R350 Frequency of micturition: Secondary | ICD-10-CM

## 2018-03-19 DIAGNOSIS — N401 Enlarged prostate with lower urinary tract symptoms: Secondary | ICD-10-CM

## 2018-03-19 DIAGNOSIS — Z23 Encounter for immunization: Secondary | ICD-10-CM

## 2018-03-19 DIAGNOSIS — Z1389 Encounter for screening for other disorder: Secondary | ICD-10-CM

## 2018-03-19 DIAGNOSIS — G4733 Obstructive sleep apnea (adult) (pediatric): Secondary | ICD-10-CM

## 2018-03-19 DIAGNOSIS — Z0000 Encounter for general adult medical examination without abnormal findings: Secondary | ICD-10-CM

## 2018-03-19 DIAGNOSIS — E1122 Type 2 diabetes mellitus with diabetic chronic kidney disease: Secondary | ICD-10-CM

## 2018-03-19 DIAGNOSIS — Z111 Encounter for screening for respiratory tuberculosis: Secondary | ICD-10-CM | POA: Diagnosis not present

## 2018-03-19 DIAGNOSIS — Z131 Encounter for screening for diabetes mellitus: Secondary | ICD-10-CM

## 2018-03-19 DIAGNOSIS — R5383 Other fatigue: Secondary | ICD-10-CM | POA: Insufficient documentation

## 2018-03-19 DIAGNOSIS — Z13220 Encounter for screening for lipoid disorders: Secondary | ICD-10-CM | POA: Diagnosis not present

## 2018-03-19 DIAGNOSIS — Z1211 Encounter for screening for malignant neoplasm of colon: Secondary | ICD-10-CM

## 2018-03-19 DIAGNOSIS — Z1212 Encounter for screening for malignant neoplasm of rectum: Secondary | ICD-10-CM

## 2018-03-19 DIAGNOSIS — E559 Vitamin D deficiency, unspecified: Secondary | ICD-10-CM | POA: Diagnosis not present

## 2018-03-19 DIAGNOSIS — I10 Essential (primary) hypertension: Secondary | ICD-10-CM

## 2018-03-19 DIAGNOSIS — Z136 Encounter for screening for cardiovascular disorders: Secondary | ICD-10-CM | POA: Diagnosis not present

## 2018-03-19 DIAGNOSIS — E349 Endocrine disorder, unspecified: Secondary | ICD-10-CM

## 2018-03-19 DIAGNOSIS — Z125 Encounter for screening for malignant neoplasm of prostate: Secondary | ICD-10-CM

## 2018-03-19 DIAGNOSIS — Z87891 Personal history of nicotine dependence: Secondary | ICD-10-CM

## 2018-03-19 DIAGNOSIS — Z1329 Encounter for screening for other suspected endocrine disorder: Secondary | ICD-10-CM | POA: Diagnosis not present

## 2018-03-19 DIAGNOSIS — N182 Chronic kidney disease, stage 2 (mild): Secondary | ICD-10-CM

## 2018-03-19 DIAGNOSIS — Z0001 Encounter for general adult medical examination with abnormal findings: Secondary | ICD-10-CM

## 2018-03-19 DIAGNOSIS — E782 Mixed hyperlipidemia: Secondary | ICD-10-CM

## 2018-03-19 NOTE — Patient Instructions (Signed)

## 2018-03-19 NOTE — Progress Notes (Signed)
[facility] [facility]   Lucky Cowboy, M.D.     Dyanne Carrel. Steffanie Dunn, P.A.-C Judd Gaudier, DNP [facility]                [address]                [place], [place]. [address] Telephone [phone] Telefax [phone] Annual  Screening/Preventative Visit  & Comprehensive Evaluation & Examination        This very nice 67 y.o. MBM presents for a Screening /Preventative Visit & comprehensive evaluation and management of multiple medical co-morbidities.  Patient has been followed for HTN, HLD, T2_NIDDM  and Vitamin D Deficiency. Patient has GERD controlled with his meds.                                         HTN predates circa 1989. Patient's BP has been controlled at home.  Today's BP is at goal 124/84. Patient denies any cardiac symptoms as chest pain, palpitations, shortness of breath, dizziness or ankle swelling.                                         Patient's hyperlipidemia is controlled with diet and medications. Patient denies myalgias or other medication SE's. Last lipids were at goal:   Component Value Date   CHOL 142 11/19/2017   HDL 40 (L) 11/19/2017   LDLCALC 81 11/19/2017   TRIG 109 11/19/2017   CHOLHDL 3.6 11/19/2017                                            Patient has hx/o Morbid Obesity (BMI 31+) and T2_NIDDM (2007) with CKD2 (GFR 84) and patient denies reactive hypoglycemic symptoms, visual blurring, diabetic polys or paresthesias. Last A1c was not at goal: Component Value Date   HGBA1C 6.7 (H) 11/19/2017                                             Patient has hx/o Testosterone Deficiency levels "209" / 2015 and "154" / 2016) and he has deferred replacement therapy for perceived lack of need.                                          Finally, patient has history of Vitamin D Deficiency ("22" / 2008) and last vitamin D was still low at "39" on 08/06/2017.    Current Outpatient Medications on  File Prior to Visit  Medication Sig  . aspirin 81 MG t Take 81 mg by mouth daily.  . Atenolol 100 MG  Take 1 tablet daily for BP  . VITAMIN D3 2000 units  Takes 6000 units daily  . citalopram  40 MG  TAKE 1 TABLET  DAILY   . losartan-hctz 100-25 MG Take 1 tablet  daily  . Magnesium 400 MG  Take by mouth daily.   . metFORMIN-XR 500 MG  TAKE 2 TABLETS  TWICE DAILY   . pantoprazole  40 MG tablet Take 1 tablet daily for Heartburn & Indigestion  . Probiotic  Take by mouth daily.  . rosuvastatin  10 MG tablet Take 1 tablet daily    No Known Allergies   Past Medical History:  Diagnosis Date  . Allergy   . Anxiety    On Xanax  . Chest pain   . Fatigue   . GERD (gastroesophageal reflux disease)   . Heart murmur    hx of MVP   . HTN (hypertension)   . Hyperlipemia   . IBS (irritable bowel syndrome)   . Medication management 06/13/2013  . Mitral valve prolapse   . Obesity (BMI 30.0-34.9) 08/20/2015  . OSA (obstructive sleep apnea)    Restated CPAP  . Sleep apnea    no cpap   . T2_NIDDM   . Testosterone deficiency 08/20/2015  . Type II or unspecified type diabetes mellitus without mention of complication, not stated as uncontrolled   . Vitamin D deficiency    Health Maintenance  Topic Date Due  . OPHTHALMOLOGY EXAM  01/19/2016  . PNA vac Low Risk Adult (1 of 2 - PCV13) 05/15/2017  . INFLUENZA VACCINE  12/13/2017  . HEMOGLOBIN A1C  05/22/2018  . FOOT EXAM  03/20/2019  . COLONOSCOPY  03/30/2022  . TETANUS/TDAP  01/31/2024  . Hepatitis C Screening  Completed  . HIV Screening  Completed   Immunization History  Administered Date(s) Administered  . Influenza Inj Mdck Quad With Preservative 01/29/2017  . Influenza Split 03/01/2015  . Influenza, High Dose Seasonal PF 03/19/2018  . Influenza,inj,quad, With Preservative 03/03/2016  . PPD Test 01/30/2014, 01/29/2017, 03/19/2018  . Pneumococcal Conjugate-13 03/19/2018  . Pneumococcal Polysaccharide-23 05/21/2008  . Td 10/30/2003  .  Tdap 01/30/2014   Last Colon - 03/30/2017 - Dr Christella Hartigan - recommended  f/u 5 years due to (+) FHx.   Past Surgical History:  Procedure Laterality Date  . COLONOSCOPY    . TONSILLECTOMY    . WISDOM TOOTH EXTRACTION     Family History  Problem Relation Age of Onset  . Stroke Unknown   . Hypertension Unknown   . Hyperlipidemia Unknown   . Diabetes Unknown   . Colon cancer Father 9  . Hypertension Father   . Dementia Mother   . Alzheimer's disease Mother   . Colon polyps Neg Hx   . Esophageal cancer Neg Hx   . Rectal cancer Neg Hx   . Stomach cancer Neg Hx    Social History   Socioeconomic History  . Marital status: Married    Spouse name: Jasmine December  Occupational History  . Sales  Tobacco Use  . Smoking status: Former Smoker    Last attempt to quit: 09/06/2007    Years since quitting: 10.5  . Smokeless tobacco: Never Used  Substance and Sexual Activity  . Alcohol use: Yes    Comment: occasional  . Drug use: No  . Sexual activity: Not on file    ROS Constitutional: Denies fever, chills, weight loss/gain, headaches, insomnia,  night sweats or change in appetite. Does c/o fatigue. Eyes: Denies redness, blurred vision, diplopia, discharge, itchy or watery eyes.  ENT: Denies discharge, congestion, post nasal drip, epistaxis, sore throat, earache, hearing loss, dental pain, Tinnitus, Vertigo, Sinus pain or snoring.  Cardio: Denies chest pain, palpitations, irregular heartbeat, syncope, dyspnea, diaphoresis, orthopnea, PND, claudication or edema Respiratory: denies cough, dyspnea, DOE, pleurisy, hoarseness, laryngitis or wheezing.  Gastrointestinal: Denies dysphagia, heartburn,  reflux, water brash, pain, cramps, nausea, vomiting, bloating, diarrhea, constipation, hematemesis, melena, hematochezia, jaundice or hemorrhoids Genitourinary: Denies dysuria, frequency, urgency, nocturia, hesitancy, discharge, hematuria or flank pain Musculoskeletal: Denies arthralgia, myalgia, stiffness,  Jt. Swelling, pain, limp or strain/sprain. Denies Falls. Skin: Denies puritis, rash, hives, warts, acne, eczema or change in skin lesion Neuro: No weakness, tremor, incoordination, spasms, paresthesia or pain Psychiatric: Denies confusion, memory loss or sensory loss. Denies Depression. Endocrine: Denies change in weight, skin, hair change, nocturia, and paresthesia, diabetic polys, visual blurring or hyper / hypo glycemic episodes.  Heme/Lymph: No excessive bleeding, bruising or enlarged lymph nodes.  Physical Exam  BP 124/84   Pulse 83   Temp (!) 97.4 F (36.3 C)   Resp 16   Ht 6' (1.829 m)   Wt 235 lb 9.6 oz (106.9 kg)   SpO2 99%   BMI 31.95 kg/m   General Appearance: Over nourished and well groomed and in no apparent distress.  Eyes: PERRLA, EOMs, conjunctiva no swelling or erythema, normal fundi and vessels. Sinuses: No frontal/maxillary tenderness ENT/Mouth: EACs patent / TMs  nl. Nares clear without erythema, swelling, mucoid exudates. Oral hygiene is good. No erythema, swelling, or exudate. Tongue normal, non-obstructing. Tonsils not swollen or erythematous. Hearing normal.  Neck: Supple, thyroid not palpable. No bruits, nodes or JVD. Respiratory: Respiratory effort normal.  BS equal and clear bilateral without rales, rhonci, wheezing or stridor. Cardio: Heart sounds are normal with regular rate and rhythm and no murmurs, rubs or gallops. Peripheral pulses are normal and equal bilaterally without edema. No aortic or femoral bruits. Chest: symmetric with normal excursions and percussion.  Abdomen: Soft, with Nl bowel sounds. Nontender, no guarding, rebound, hernias, masses, or organomegaly.  Lymphatics: Non tender without lymphadenopathy.  Genitourinary: No hernias.Testes nl. DRE - prostate nl for age - smooth & firm w/o nodules. Musculoskeletal: Full ROM all peripheral extremities, joint stability, 5/5 strength, and normal gait. Skin: Warm and dry without rashes, lesions,  cyanosis, clubbing or  ecchymosis.  Neuro: Cranial nerves intact, reflexes equal bilaterally. Normal muscle tone, no cerebellar symptoms. Sensation intact to touch, vibratory and Monofilament to the toes bilaterally. Pysch: Alert and oriented X 3 with normal affect, insight and judgment appropriate.   Assessment and Plan  1. Annual Preventative/Screening Exam   2. Essential hypertension  - EKG 12-Lead - [place], RETROPERITNL ABD,  LTD - Urinalysis, Routine w reflex microscopic - Microalbumin / creatinine urine ratio - CBC with Differential/Platelet - COMPLETE METABOLIC PANEL WITH GFR - Magnesium - TSH  3. Hyperlipidemia, mixed  - EKG 12-Lead - [place], RETROPERITNL ABD,  LTD - Lipid panel - TSH  4. Type 2 diabetes mellitus with stage 2 chronic kidney disease, without long-term current use of insulin (HCC)  - EKG 12-Lead - [place], RETROPERITNL ABD,  LTD - HM DIABETES FOOT EXAM - LOW EXTREMITY NEUR EXAM DOCUM - Hemoglobin A1c - Insulin, random  5. Vitamin D deficiency  - VITAMIN D 25 Hydroxyl  6. OSA (obstructive sleep apnea)   7. Testosterone deficiency  - Testosterone  8. Screening for colorectal cancer  - Ambulatory referral to Gastroenterology  9. Screening for prostate cancer  - PSA  10. Screening examination for pulmonary tuberculosis  - TB Skin Test  11. Needs flu shot   12. Need for prophylactic vaccination against Streptococcus pneumoniae (pneumococcus)  - Flu vaccine HIGH DOSE PF - Pneumococcal conjugate vaccine 13-valent IM  13. Screening for ischemic heart disease (IHD)  - EKG 12-Lead - Lipid panel  14. FH: hypertension  - EKG 12-Lead - [place], RETROPERITNL ABD,  LTD  15. Former smoker  - EKG 12-Lead - [place], RETROPERITNL ABD,  LTD  16. Screening for AAA (aortic abdominal aneurysm)  - [place], RETROPERITNL ABD,  LTD  17. Fatigue, unspecified type  - Iron,Total/Total Iron Binding Cap - Vitamin B12 - Testosterone  18. Medication  management  - Urinalysis, Routine w reflex microscopic - Microalbumin / creatinine urine ratio - Testosterone      Patient was counseled in prudent diet, weight control to achieve/maintain BMI less than 25, BP monitoring, regular exercise and medications as discussed.  Discussed med effects and SE's. Routine screening labs and tests as requested with regular follow-up as recommended. Over 40 minutes of exam, counseling, chart review and high complex critical decision making was performed

## 2018-03-20 LAB — URINALYSIS, ROUTINE W REFLEX MICROSCOPIC
Bilirubin Urine: NEGATIVE
GLUCOSE, UA: NEGATIVE
Hgb urine dipstick: NEGATIVE
Ketones, ur: NEGATIVE
LEUKOCYTES UA: NEGATIVE
Nitrite: NEGATIVE
PH: 7 (ref 5.0–8.0)
Protein, ur: NEGATIVE
Specific Gravity, Urine: 1.02 (ref 1.001–1.03)

## 2018-03-20 LAB — COMPLETE METABOLIC PANEL WITH GFR
AG Ratio: 1.4 (calc) (ref 1.0–2.5)
ALKALINE PHOSPHATASE (APISO): 60 U/L (ref 40–115)
ALT: 19 U/L (ref 9–46)
AST: 22 U/L (ref 10–35)
Albumin: 4.2 g/dL (ref 3.6–5.1)
BILIRUBIN TOTAL: 0.6 mg/dL (ref 0.2–1.2)
BUN: 14 mg/dL (ref 7–25)
CO2: 29 mmol/L (ref 20–32)
Calcium: 9.9 mg/dL (ref 8.6–10.3)
Chloride: 101 mmol/L (ref 98–110)
Creat: 0.97 mg/dL (ref 0.70–1.25)
GFR, EST AFRICAN AMERICAN: 95 mL/min/{1.73_m2} (ref >=60)
GFR, Est Non African American: 82 mL/min/{1.73_m2} (ref >=60)
GLOBULIN: 3.1 g/dL (ref 1.9–3.7)
Glucose, Bld: 95 mg/dL (ref 65–99)
POTASSIUM: 4 mmol/L (ref 3.5–5.3)
SODIUM: 138 mmol/L (ref 135–146)
TOTAL PROTEIN: 7.3 g/dL (ref 6.1–8.1)

## 2018-03-20 LAB — TSH: TSH: 2.01 mIU/L (ref 0.40–4.50)

## 2018-03-20 LAB — TESTOSTERONE: Testosterone: 142 ng/dL — ABNORMAL LOW (ref 250–827)

## 2018-03-20 LAB — HEMOGLOBIN A1C
Hgb A1c MFr Bld: 8 % of total Hgb — ABNORMAL HIGH (ref <5.7)
Mean Plasma Glucose: 183 (calc)
eAG (mmol/L): 10.1 (calc)

## 2018-03-20 LAB — CBC WITH DIFFERENTIAL/PLATELET
BASOS ABS: 84 {cells}/uL (ref 0–200)
BASOS PCT: 1.1 %
EOS ABS: 388 {cells}/uL (ref 15–500)
Eosinophils Relative: 5.1 %
HEMATOCRIT: 39.2 % (ref 38.5–50.0)
HEMOGLOBIN: 13.3 g/dL (ref 13.2–17.1)
LYMPHS ABS: 4248 {cells}/uL — AB (ref 850–3900)
MCH: 30.7 pg (ref 27.0–33.0)
MCHC: 33.9 g/dL (ref 32.0–36.0)
MCV: 90.5 fL (ref 80.0–100.0)
MPV: 10.6 fL (ref 7.5–12.5)
Monocytes Relative: 6.3 %
Neutro Abs: 2402 cells/uL (ref 1500–7800)
Neutrophils Relative %: 31.6 %
Platelets: 297 10*3/uL (ref 140–400)
RBC: 4.33 10*6/uL (ref 4.20–5.80)
RDW: 12.2 % (ref 11.0–15.0)
Total Lymphocyte: 55.9 %
WBC: 7.6 10*3/uL (ref 3.8–10.8)
WBCMIX: 479 {cells}/uL (ref 200–950)

## 2018-03-20 LAB — LIPID PANEL
CHOL/HDL RATIO: 3.4 (calc) (ref <5.0)
Cholesterol: 137 mg/dL (ref <200)
HDL: 40 mg/dL — AB (ref >40)
LDL CHOLESTEROL (CALC): 81 mg/dL
NON-HDL CHOLESTEROL (CALC): 97 mg/dL (ref <130)
TRIGLYCERIDES: 76 mg/dL (ref <150)

## 2018-03-20 LAB — VITAMIN B12: Vitamin B-12: 395 pg/mL (ref 200–1100)

## 2018-03-20 LAB — PSA: PSA: 1.2 ng/mL (ref <=4.0)

## 2018-03-20 LAB — VITAMIN D 25 HYDROXY (VIT D DEFICIENCY, FRACTURES): Vit D, 25-Hydroxy: 23 ng/mL — ABNORMAL LOW (ref 30–100)

## 2018-03-20 LAB — IRON, TOTAL/TOTAL IRON BINDING CAP
%SAT: 26 % (ref 20–48)
IRON: 94 ug/dL (ref 50–180)
TIBC: 357 ug/dL (ref 250–425)

## 2018-03-20 LAB — MAGNESIUM: Magnesium: 1.7 mg/dL (ref 1.5–2.5)

## 2018-03-20 LAB — MICROALBUMIN / CREATININE URINE RATIO
Creatinine, Urine: 176 mg/dL (ref 20–320)
MICROALB/CREAT RATIO: 3 ug/mg{creat} (ref <30)
Microalb, Ur: 0.6 mg/dL

## 2018-03-20 LAB — INSULIN, RANDOM: Insulin: 14.3 u[IU]/mL (ref 2.0–19.6)

## 2018-03-21 ENCOUNTER — Encounter: Payer: Self-pay | Admitting: *Deleted

## 2018-05-01 ENCOUNTER — Other Ambulatory Visit: Payer: Self-pay | Admitting: Internal Medicine

## 2018-05-01 MED ORDER — CEPHALEXIN 500 MG PO CAPS: ORAL_CAPSULE | ORAL | 0 refills | Status: DC

## 2018-05-01 MED ORDER — ACYCLOVIR 800 MG PO TABS: ORAL_TABLET | ORAL | 3 refills | Status: DC

## 2018-05-11 ENCOUNTER — Other Ambulatory Visit: Payer: Self-pay | Admitting: Internal Medicine

## 2018-05-11 DIAGNOSIS — E782 Mixed hyperlipidemia: Secondary | ICD-10-CM

## 2018-07-08 NOTE — Progress Notes (Deleted)
MEDICARE ANNUAL WELLNESS VISIT AND FOLLOW UP Assessment:    Over 30 minutes of exam, counseling, chart review, and critical decision making was performed  Future Appointments  Date Time Provider [place]  07/09/2018  3:45 PM Vicie Mutters, PA-C [facility] None  10/08/2018  4:30 PM Unk Pinto, MD [facility] None  04/23/2019  3:00 PM Unk Pinto, MD [facility] None     Plan:   During the course of the visit the patient was educated and counseled about appropriate screening and preventive services including:    Pneumococcal vaccine   Influenza vaccine  Prevnar 13  Td vaccine  Screening electrocardiogram  Colorectal cancer screening  Diabetes screening  Glaucoma screening  Nutrition counseling    Subjective:  Wayne [name] is a 68 y.o. male who presents for Medicare Annual Wellness Visit and 3 month follow up for HTN, hyperlipidemia, diabetes, and vitamin D Def.   His blood pressure {HAS HAS NOT:18834} been controlled at home, today their BP is   He {DOES_DOES ZOX:09604} workout. He denies chest pain, shortness of breath, dizziness.  Normal stress test and echo 2014.  BMI is There is no height or weight on file to calculate BMI., he is working on diet and exercise. Wt Readings from Last 3 Encounters:  03/19/18 235 lb 9.6 oz (106.9 kg)  11/19/17 227 lb (103 kg)  08/17/17 228 lb 12.8 oz (103.8 kg)    He is on cholesterol medication, HE IS ON CRESTOR 10 MG and denies myalgias. His cholesterol is not at goal. The cholesterol last visit was:   Lab Results  Component Value Date   CHOL 137 03/19/2018   HDL 40 (L) 03/19/2018   LDLCALC 81 03/19/2018   TRIG 76 03/19/2018   CHOLHDL 3.4 03/19/2018   He has been working on diet and exercise for Diabetes without complications, HE IS ON METFORMIN he is on bASA, he is on ACE/ARB, and denies paresthesia of the feet, polydipsia, polyuria and visual disturbances. Last A1C was:  Lab Results  Component Value  Date   HGBA1C 8.0 (H) 03/19/2018    Lab Results  Component Value Date   GFRAA 95 03/19/2018   Patient is on Vitamin D supplement.   Lab Results  Component Value Date   VD25OH 23 (L) 03/19/2018      Medication Review:  Current Outpatient Medications (Endocrine & Metabolic):  .  metFORMIN (GLUCOPHAGE-XR) 500 MG 24 hr tablet, TAKE 2 TABLETS BY MOUTH TWICE DAILY WITH FOOD FOR DIABETES  Current Outpatient Medications (Cardiovascular):  .  atenolol (TENORMIN) 100 MG tablet, Take 1 tablet daily for BP .  losartan-hydrochlorothiazide (HYZAAR) 100-25 MG tablet, Take 1 tablet by mouth daily. for blood pressure .  rosuvastatin (CRESTOR) 10 MG tablet, TAKE 1 TABLET BY MOUTH DAILY FOR CHOLESTEROL   Current Outpatient Medications (Analgesics):  .  aspirin 81 MG tablet, Take 81 mg by mouth daily.   Current Outpatient Medications (Other):  .  acyclovir (ZOVIRAX) 800 MG tablet, Take 1 tablet 3 x /day - meals & 2 tablets at bedtime  (# 5 tabs /day) .  Blood Glucose Monitoring Suppl (ONETOUCH VERIO) w/Device KIT, 1 kit by Does not apply route daily. .  cephALEXin (KEFLEX) 500 MG capsule, Take 1 capsule 4 x /day - meals & bedtime w Lillie Columbia .  Cholecalciferol (VITAMIN D3) 2000 units capsule, Take 1 capsule (2,000 Units total) by mouth daily. Takes 6000 units daily .  citalopram (CELEXA) 40 MG tablet, TAKE 1 TABLET BY MOUTH DAILY FOR  MOOD .  glucose blood (ONETOUCH VERIO) test strip, Check blood sugar 1 time a day-[id number]. .  Magnesium 400 MG CAPS, Take by mouth daily.  .  pantoprazole (PROTONIX) 40 MG tablet, Take 1 tablet daily for Heartburn & Indigestion .  Probiotic Product (PROBIOTIC DAILY PO), Take by mouth daily.  Allergies: No Known Allergies  Current Problems (verified) has HTN (hypertension); Hyperlipidemia, mixed; GERD (gastroesophageal reflux disease); Anxiety; Type 2 diabetes mellitus with stage 2 chronic kidney disease, without long-term current use of insulin (HCC); OSA  (obstructive sleep apnea); IBS (irritable bowel syndrome); Vitamin D deficiency; Encounter for general adult medical examination with abnormal findings; Obesity (BMI 30.0-34.9); Testosterone deficiency; Chest pain; Heart murmur; Sleep apnea; Fatigue; Former smoker; FH: hypertension; and Screening examination for pulmonary tuberculosis on their problem list.  Screening Tests Immunization History  Administered Date(s) Administered  . Influenza Inj Mdck Quad With Preservative 01/29/2017  . Influenza Split 03/01/2015  . Influenza, High Dose Seasonal PF 03/19/2018  . Influenza,inj,quad, With Preservative 03/03/2016  . PPD Test 01/30/2014, 01/29/2017, 03/19/2018  . Pneumococcal Conjugate-13 03/19/2018  . Pneumococcal Polysaccharide-23 05/21/2008  . Td 10/30/2003  . Tdap 01/30/2014   Tetanus: 2015 Pneumovax: 2010 Prevnar 13: 2019 Flu vaccine: 2019 Colonoscopy: 03/2015 Dr. Ardis Hughs  Eye Exam: UTD Dentist: Sees his dentist at least yearly  Names of Other Physician/Practitioners you currently use: 1. [facility] here for primary care 2. ***, eye doctor, last visit  3. ***, dentist, last visit  Patient Care Team: Unk Pinto, MD as PCP - General (Internal Medicine)  Surgical: He  has a past surgical history that includes Wisdom tooth extraction; Tonsillectomy; and Colonoscopy. Family His family history includes Alzheimer's disease in his mother; Colon cancer (age of onset: 28) in his father; Dementia in his mother; Diabetes in his unknown relative; Hyperlipidemia in his unknown relative; Hypertension in his father and unknown relative; Stroke in his unknown relative. Social history  He reports that he quit smoking about 10 years ago. He has never used smokeless tobacco. He reports current alcohol use. He reports that he does not use drugs.  MEDICARE WELLNESS OBJECTIVES: Physical activity:   Cardiac risk factors:   Depression/mood screen:    Depression screen [facility] 2/9 03/19/2018  Decreased Interest 0  Down, Depressed, Hopeless 0  PHQ - 2 Score 0    ADLs:  In your present state of health, do you have any difficulty performing the following activities: 03/19/2018 02/18/2018  Hearing? N N  Vision? N N  Difficulty concentrating or making decisions? N N  Walking or climbing stairs? N N  Dressing or bathing? N N  Doing errands, shopping? N N  Some recent data might be hidden     Cognitive Testing  Alert? Yes  Normal Appearance?Yes  Oriented to person? Yes  Place? Yes   Time? Yes  Recall of three objects?  Yes  Can perform simple calculations? Yes  Displays appropriate judgment?Yes  Can read the correct time from a watch face?Yes  EOL planning:     Objective:   There were no vitals filed for this visit. There is no height or weight on file to calculate BMI.  General appearance: alert, no distress, WD/WN, male HEENT: normocephalic, sclerae anicteric, TMs pearly, nares patent, no discharge or erythema, pharynx normal Oral cavity: MMM, no lesions Neck: supple, no lymphadenopathy, no thyromegaly, no masses Heart: RRR, normal S1, S2, no murmurs Lungs: CTA bilaterally, no wheezes, rhonchi, or rales Abdomen: +bs, soft, non tender,  non distended, no masses, no hepatomegaly, no splenomegaly Musculoskeletal: nontender, no swelling, no obvious deformity Extremities: no edema, no cyanosis, no clubbing Pulses: 2+ symmetric, upper and lower extremities, normal cap refill Neurological: alert, oriented x 3, CN2-12 intact, strength normal upper extremities and lower extremities, sensation normal throughout, DTRs 2+ throughout, no cerebellar signs, gait normal Psychiatric: normal affect, behavior normal, pleasant   Medicare Attestation I have personally reviewed: The patient's medical and social history Their use of alcohol, tobacco or illicit drugs Their current medications and supplements The patient's functional ability  including ADLs,fall risks, home safety risks, cognitive, and hearing and visual impairment Diet and physical activities Evidence for depression or mood disorders  The patient's weight, height, BMI, and visual acuity have been recorded in the chart.  I have made referrals, counseling, and provided education to the patient based on review of the above and I have provided the patient with a written personalized care plan for preventive services.     Vicie Mutters, PA-C   07/08/2018

## 2018-07-09 ENCOUNTER — Ambulatory Visit: Payer: Self-pay | Admitting: Physician Assistant

## 2018-08-05 ENCOUNTER — Other Ambulatory Visit: Payer: Self-pay | Admitting: Internal Medicine

## 2018-08-05 DIAGNOSIS — E782 Mixed hyperlipidemia: Secondary | ICD-10-CM

## 2018-08-05 DIAGNOSIS — I10 Essential (primary) hypertension: Secondary | ICD-10-CM

## 2018-08-05 MED ORDER — ROSUVASTATIN CALCIUM 10 MG PO TABS: ORAL_TABLET | ORAL | 0 refills | Status: DC

## 2018-08-05 MED ORDER — LOSARTAN POTASSIUM-HCTZ 100-25 MG PO TABS: ORAL_TABLET | ORAL | 0 refills | Status: DC

## 2018-08-05 MED ORDER — CITALOPRAM HYDROBROMIDE 40 MG PO TABS: ORAL_TABLET | ORAL | 0 refills | Status: DC

## 2018-08-06 ENCOUNTER — Other Ambulatory Visit: Payer: Self-pay | Admitting: *Deleted

## 2018-08-06 DIAGNOSIS — E782 Mixed hyperlipidemia: Secondary | ICD-10-CM

## 2018-08-06 DIAGNOSIS — I10 Essential (primary) hypertension: Secondary | ICD-10-CM

## 2018-08-06 MED ORDER — CITALOPRAM HYDROBROMIDE 40 MG PO TABS: ORAL_TABLET | ORAL | 0 refills | Status: DC

## 2018-08-06 MED ORDER — ROSUVASTATIN CALCIUM 10 MG PO TABS: ORAL_TABLET | ORAL | 0 refills | Status: DC

## 2018-08-06 MED ORDER — LOSARTAN POTASSIUM-HCTZ 100-25 MG PO TABS: ORAL_TABLET | ORAL | 0 refills | Status: DC

## 2018-08-28 NOTE — Progress Notes (Signed)
WELCOME TO MEDICARE VISIT  Assessment and Plan:   Essential hypertension -     Ambulatory referral to Neurosurgery -     CBC with Differential/Platelet -     COMPLETE METABOLIC PANEL WITH GFR -     TSH -     DG Chest 2 View; Future - continue medications, DASH diet, exercise and monitor at home. Call if greater than 130/80.   Hyperlipidemia, mixed -     Lipid panel check lipids decrease fatty foods increase activity.   Type 2 diabetes mellitus with stage 2 chronic kidney disease, without long-term current use of insulin (HCC) -     Hemoglobin A1c Discussed general issues about diabetes pathophysiology and management., Educational material distributed., Suggested low cholesterol diet., Encouraged aerobic exercise., Discussed foot care., Reminded to get yearly retinal exam. STOP SUGARY DRINKS START TO CHECK SUGARS FOLLOW UP CARDIOLOGY  Acute left-sided low back pain with left-sided sciatica Referral to neurosurgery No red flag symptoms, no urinary issues.   Vitamin D deficiency -     VITAMIN D 25 Hydroxy (Vit-D Deficiency, Fractures)  Medication management -     Magnesium  OSA (obstructive sleep apnea) -     Ambulatory referral to Sleep Studies - with DM poorly controled, chol, obesity strongly suggest CPAP  Former smoker With diaphoresis needs to get CXR Order placed Patient and get at his convinence  Gastroesophageal reflux disease, esophagitis presence not specified Continue PPI/H2 blocker, diet discussed  Encounter for Medicare annual wellness exam 1 year   Continue diet and meds as discussed. Further disposition pending results of labs. Discussed med's effects and SE's.   Over 30 minutes of exam, counseling, chart review, and critical decision making was performed.   Future Appointments  Date Time Provider [place]  10/08/2018  4:30 PM Unk Pinto, MD [facility] None  04/23/2019  3:00 PM Unk Pinto, MD [facility] None     ----------------------------------------------------------------------------------------------------------------------  HPI 68 y.o. male  presents for 3 month follow up on hypertension, cholesterol, T2 diabetes treated by oral antidiabetic agents, GERD, obesity, anxiety and vitamin D deficiency.   He recently had colonoscopy in November 2018 - results reviewed with him due 5 years.   He continues to follow with Dr. Jerene Bears with neurosurgery for his back pain with L sided sciatica; discussing surgery for L4-5 disc degeneration but patient would like to avoid if possible. He needs another referral for possible ESI or other therapeutic options. States he has trouble with walking due to pain, numbness tingling in left leg. No urinary issues, no leg giving out on him, no saddle anesthesia.   He recently was evaluated by Dr. Shirlee More for atypical chest pain and is pending stress test, stress ECHO and CT calcium score but is waiting on his deductible to be met, anticipates this fall. He reports no further episodes, reports he thinks was stress related. He complains of sweating day or night, non exertional, no symptoms with it. He has not had a CXR since 2013.   he has a diagnosis of anxiety and is currently prescribed celexa 40 mg daily, reports symptoms are well controlled on current regimen.   BMI is Body mass index is 32.28 kg/m., he has been working on diet and exercises intermittently- somewhat limited recently by back pain and work schedule. Wt Readings from Last 3 Encounters:  08/29/18 238 lb (108 kg)  03/19/18 235 lb 9.6 oz (106.9 kg)  11/19/17 227 lb (103 kg)   he has a diagnosis of  GERD which is currently managed by protonix 40 mg daily; at the last visit patient had reported poor control of symptoms despite use of OTC ranitidine/famodipine. he reports symptoms is currently well controlled, and denies breakthrough reflux, burning in chest, hoarseness or cough.    His blood  pressure has been controlled at home, today their BP is BP: 122/86  He does not workout. He denies chest pain, shortness of breath, dizziness.   He is on cholesterol medication and denies myalgias. His cholesterol is not at goal. The cholesterol last visit was:   Lab Results  Component Value Date   CHOL 137 03/19/2018   HDL 40 (L) 03/19/2018   LDLCALC 81 03/19/2018   TRIG 76 03/19/2018   CHOLHDL 3.4 03/19/2018    He has been working on diet and exercise for T2 diabetes (on metformin 2000 mg daily), and denies increased appetite, nausea, paresthesia of the feet, polydipsia, polyuria, visual disturbances and vomiting. He does not check his sugars.  Last A1C in the office was:  Lab Results  Component Value Date   HGBA1C 8.0 (H) 03/19/2018   Lab Results  Component Value Date   GFRAA 95 03/19/2018    Patient is on Vitamin D supplement but remained below goal at the last visit:    Lab Results  Component Value Date   VD25OH 23 (L) 03/19/2018     Lab Results  Component Value Date   PSA 1.2 03/19/2018   PSA 1.1 01/29/2017   PSA 1.30 03/01/2015    Current Medications:   Current Outpatient Medications (Endocrine & Metabolic):  .  metFORMIN (GLUCOPHAGE-XR) 500 MG 24 hr tablet, TAKE 2 TABLETS BY MOUTH TWICE DAILY WITH FOOD FOR DIABETES  Current Outpatient Medications (Cardiovascular):  .  losartan-hydrochlorothiazide (HYZAAR) 100-25 MG tablet, Take 1 tablet Daily for BP .  rosuvastatin (CRESTOR) 10 MG tablet, Take 1 tablet Daily for Cholesterol .  atenolol (TENORMIN) 100 MG tablet, Take 1 tablet daily for BP   Current Outpatient Medications (Analgesics):  .  aspirin 81 MG tablet, Take 81 mg by mouth daily.   Current Outpatient Medications (Other):  .  acyclovir (ZOVIRAX) 800 MG tablet, Take 1 tablet 3 x /day - meals & 2 tablets at bedtime  (# 5 tabs /day) .  Blood Glucose Monitoring Suppl (ONETOUCH VERIO) w/Device KIT, 1 kit by Does not apply route daily. .  Cholecalciferol  (VITAMIN D3) 2000 units capsule, Take 1 capsule (2,000 Units total) by mouth daily. Takes 6000 units daily .  citalopram (CELEXA) 40 MG tablet, Take 1 tablet Daily for Mood .  glucose blood (ONETOUCH VERIO) test strip, Check blood sugar 1 time a day-[id number]. .  Magnesium 400 MG CAPS, Take by mouth daily.  .  Probiotic Product (PROBIOTIC DAILY PO), Take by mouth daily. .  pantoprazole (PROTONIX) 40 MG tablet, Take 1 tablet daily for Heartburn & Indigestion   Medical History:  Past Medical History:  Diagnosis Date  . Allergy   . Anxiety    On Xanax  . Chest pain   . Fatigue   . GERD (gastroesophageal reflux disease)   . Heart murmur    hx of MVP   . HTN (hypertension)   . Hyperlipemia   . IBS (irritable bowel syndrome)   . Medication management 06/13/2013  . Mitral valve prolapse   . Obesity (BMI 30.0-34.9) 08/20/2015  . OSA (obstructive sleep apnea)    Restated CPAP  . Sleep apnea    no cpap   .  T2_NIDDM   . Testosterone deficiency 08/20/2015  . Type II or unspecified type diabetes mellitus without mention of complication, not stated as uncontrolled   . Vitamin D deficiency    Allergies No Known Allergies  Immunization History  Administered Date(s) Administered  . Influenza Inj Mdck Quad With Preservative 01/29/2017  . Influenza Split 03/01/2015  . Influenza, High Dose Seasonal PF 03/19/2018  . Influenza,inj,quad, With Preservative 03/03/2016  . PPD Test 01/30/2014, 01/29/2017, 03/19/2018  . Pneumococcal Conjugate-13 03/19/2018  . Pneumococcal Polysaccharide-23 05/21/2008  . Td 10/30/2003  . Tdap 01/30/2014   Colonoscopy 03/2017  Influenza 2019 Prevnar 13 2019 Pneumonia 2010 TDAP 2015 Shingles  SURGICAL HISTORY He  has a past surgical history that includes Wisdom tooth extraction; Tonsillectomy; and Colonoscopy. FAMILY HISTORY His family history includes Alzheimer's disease in his mother; Colon cancer (age of onset: 56) in his father; Dementia in his mother;  Diabetes in his unknown relative; Hyperlipidemia in his unknown relative; Hypertension in his father and unknown relative; Stroke in his unknown relative. SOCIAL HISTORY He  reports that he quit smoking about 10 years ago. He has never used smokeless tobacco. He reports current alcohol use. He reports that he does not use drugs.  MEDICARE WELLNESS OBJECTIVES: Physical activity:   Cardiac risk factors:   Depression/mood screen:   Depression screen [facility] 2/9 03/19/2018  Decreased Interest 0  Down, Depressed, Hopeless 0  PHQ - 2 Score 0    ADLs:  In your present state of health, do you have any difficulty performing the following activities: 03/19/2018 02/18/2018  Hearing? N N  Vision? N N  Difficulty concentrating or making decisions? N N  Walking or climbing stairs? N N  Dressing or bathing? N N  Doing errands, shopping? N N  Some recent data might be hidden     Cognitive Testing  Alert? Yes  Normal Appearance?Yes  Oriented to person? Yes  Place? Yes   Time? Yes  Recall of three objects?  Yes  Can perform simple calculations? Yes  Displays appropriate judgment?Yes  Can read the correct time from a watch face?Yes  EOL planning: Does Patient Have a Medical Advance Directive?: No Would patient like information on creating a medical advance directive?: Yes (MAU/Ambulatory/Procedural Areas - Information given)   Review of Systems:  Review of Systems  Constitutional: Positive for diaphoresis. Negative for chills, fever, malaise/fatigue and weight loss.  HENT: Negative for hearing loss and tinnitus.   Eyes: Negative for blurred vision and double vision.  Respiratory: Negative for cough, shortness of breath and wheezing.   Cardiovascular: Negative for chest pain, palpitations, orthopnea, claudication and leg swelling.  Gastrointestinal: Negative for abdominal pain, blood in stool, constipation, diarrhea, heartburn, melena, nausea and vomiting.  Genitourinary: Negative.  Negative for  dysuria, frequency and urgency.  Musculoskeletal: Positive for back pain. Negative for joint pain and myalgias.  Skin: Negative for rash.  Neurological: Negative for dizziness, tingling, sensory change, focal weakness, weakness and headaches.  Endo/Heme/Allergies: Negative for polydipsia.  Psychiatric/Behavioral: Negative for depression and substance abuse. The patient is nervous/anxious (Rare, after bad day at work) and has insomnia (Rare, related to anxiety/stress).   All other systems reviewed and are negative.   Physical Exam: BP 122/86   Pulse 76   Temp (!) 97.4 F (36.3 C)   Ht 6' (1.829 m)   Wt 238 lb (108 kg)   SpO2 97%   BMI 32.28 kg/m  Wt Readings from Last 3 Encounters:  08/29/18 238 lb (108 kg)  03/19/18 235 lb 9.6 oz (106.9 kg)  11/19/17 227 lb (103 kg)   General Appearance: Well nourished, in no apparent distress. Eyes: PERRLA, EOMs, conjunctiva no swelling or erythema Sinuses: No Frontal/maxillary tenderness ENT/Mouth: Ext aud canals clear, TMs without erythema, bulging. No erythema, swelling, or exudate on post pharynx.  Tonsils not swollen or erythematous. Hearing normal.  Neck: Supple, thyroid normal.  Respiratory: Respiratory effort normal, BS equal bilaterally without rales, rhonchi, wheezing or stridor.  Cardio: RRR with no MRGs. Brisk peripheral pulses without edema.  Abdomen: Soft, + BS.  Non tender, no guarding, rebound, hernias, masses. Lymphatics: Non tender without lymphadenopathy.  Musculoskeletal: Full ROM, 5/5 strength, Normal gait Skin: Warm, dry without rashes, lesions, ecchymosis.  Neuro: Cranial nerves intact. No cerebellar symptoms.  Psych: Awake and oriented X 3, normal affect, Insight and Judgment appropriate.    Medicare Attestation I have personally reviewed: The patient's medical and social history Their use of alcohol, tobacco or illicit drugs Their current medications and supplements The patient's functional ability including  ADLs,fall risks, home safety risks, cognitive, and hearing and visual impairment Diet and physical activities Evidence for depression or mood disorders  The patient's weight, height, BMI, and visual acuity have been recorded in the chart.  I have made referrals, counseling, and provided education to the patient based on review of the above and I have provided the patient with a written personalized care plan for preventive services.    Vicie Mutters, PA-C 1:08 PM [facility]

## 2018-08-29 ENCOUNTER — Telehealth: Payer: Self-pay | Admitting: Cardiology

## 2018-08-29 ENCOUNTER — Encounter: Payer: Self-pay | Admitting: Physician Assistant

## 2018-08-29 ENCOUNTER — Other Ambulatory Visit: Payer: Self-pay

## 2018-08-29 ENCOUNTER — Ambulatory Visit (INDEPENDENT_AMBULATORY_CARE_PROVIDER_SITE_OTHER): Payer: PPO | Admitting: Physician Assistant

## 2018-08-29 VITALS — BP 122/86 | HR 76 | Temp 97.4°F | Ht 72.0 in | Wt 238.0 lb

## 2018-08-29 DIAGNOSIS — Z87891 Personal history of nicotine dependence: Secondary | ICD-10-CM

## 2018-08-29 DIAGNOSIS — Z0000 Encounter for general adult medical examination without abnormal findings: Secondary | ICD-10-CM

## 2018-08-29 DIAGNOSIS — R6889 Other general symptoms and signs: Secondary | ICD-10-CM | POA: Diagnosis not present

## 2018-08-29 DIAGNOSIS — K219 Gastro-esophageal reflux disease without esophagitis: Secondary | ICD-10-CM | POA: Diagnosis not present

## 2018-08-29 DIAGNOSIS — E782 Mixed hyperlipidemia: Secondary | ICD-10-CM | POA: Diagnosis not present

## 2018-08-29 DIAGNOSIS — N182 Chronic kidney disease, stage 2 (mild): Secondary | ICD-10-CM | POA: Diagnosis not present

## 2018-08-29 DIAGNOSIS — I10 Essential (primary) hypertension: Secondary | ICD-10-CM

## 2018-08-29 DIAGNOSIS — E559 Vitamin D deficiency, unspecified: Secondary | ICD-10-CM

## 2018-08-29 DIAGNOSIS — E1122 Type 2 diabetes mellitus with diabetic chronic kidney disease: Secondary | ICD-10-CM

## 2018-08-29 DIAGNOSIS — M5442 Lumbago with sciatica, left side: Secondary | ICD-10-CM | POA: Diagnosis not present

## 2018-08-29 DIAGNOSIS — Z0001 Encounter for general adult medical examination with abnormal findings: Secondary | ICD-10-CM | POA: Diagnosis not present

## 2018-08-29 DIAGNOSIS — Z79899 Other long term (current) drug therapy: Secondary | ICD-10-CM | POA: Diagnosis not present

## 2018-08-29 DIAGNOSIS — G4733 Obstructive sleep apnea (adult) (pediatric): Secondary | ICD-10-CM | POA: Diagnosis not present

## 2018-08-29 NOTE — Telephone Encounter (Signed)
Patient called stating he is due for his visit and also [facility] wanted him to have a stress echo and Cardiac CT. I explained to him that we are doing televisits which he said was fine but wanted to know about his testing. I told him Dr. Dulce Sellar would have to make that decision. Please advise whether we should set him up for televisit or wait to schedule testing. He has new insurance.

## 2018-08-29 NOTE — Telephone Encounter (Signed)
Left message for patient to return call re: virtual visit and testing.  He was ordered to have stress echo and cardiac ct calcium score last year.  He will need virtual visit before any of these tests can be reordered.

## 2018-08-29 NOTE — Patient Instructions (Addendum)
Call Phone: [phone] For cardiology referral call to reset up your exams Go to the ER if any chest pain, shortness of breath, nausea, dizziness, severe HA, changes vision/speech  Neurosurgery number Phone: [phone]  Go see your eye doctor   WATER IS IMPORTANT  Being dehydrated can hurt your kidneys, cause fatigue, headaches, muscle aches, joint pain, and dry skin/nails so please increase your fluids.   Drink 80-100 oz a day of water, measure it out! Eat 3 meals a day, have to do breakfast, eat protein- hard boiled eggs, protein bar like nature valley protein bar, greek yogurt like oikos triple zero, chobani 100, or light n fit greek  Can check out plantnanny app on your phone to help you keep track of your water     Bad carbs also include fruit juice, alcohol, and sweet tea. These are empty calories that do not signal to your brain that you are full.   Please remember the good carbs are still carbs which convert into sugar. So please measure them out no more than 1/2-1 cup of rice, oatmeal, pasta, and beans  Veggies are however free foods! Pile them on.   Not all fruit is created equal. Please see the list below, the fruit at the bottom is higher in sugars than the fruit at the top. Please avoid all dried fruits.      INFORMATION ABOUT YOUR XRAY  Can walk into [address] building for an Personal assistant. They will have the order and take you back. You do not any paper work, I should get the result back today or tomorrow. This order is good for a year.    Diabetes is a very complicated disease...lets simplify it.  An easy way to look at it to understand the complications is if you think of the extra sugar floating in your blood stream as glass shards floating through your blood stream.    Diabetes affects your small vessels first: 1) The glass shards (sugar) scraps down the tiny blood vessels in your eyes and lead to diabetic retinopathy, the leading cause of blindness in the  [place]. Diabetes is the leading cause of newly diagnosed adult (57 to 66 years of age) blindness in the [place].  2) The glass shards scratches down the tiny vessels of your legs leading to nerve damage called neuropathy and can lead to amputations of your feet. More than 60% of all non-traumatic amputations of lower limbs occur in people with diabetes.  3) Over time the small vessels in your brain are shredded and closed off, individually this does not cause any problems but over a long period of time many of the small vessels being blocked can lead to Vascular Dementia.   4) Your kidney's are a filter system and have a "net" that keeps certain things in the body and lets bad things out. Sugar shreds this net and leads to kidney damage and eventually failure. Decreasing the sugar that is destroying the net and certain blood pressure medications can help stop or decrease progression of kidney disease. Diabetes was the primary cause of kidney failure in 44 percent of all new cases in 2011.  5) Diabetes also destroys the small vessels in your penis that lead to erectile dysfunction. Eventually the vessels are so damaged that you may not be responsive to cialis or viagra.   Diabetes and your large vessels: Your larger vessels consist of your coronary arteries in your heart and the carotid vessels to your brain. Diabetes  or even increased sugars put you at 300% increased risk of heart attack and stroke and this is why.. The sugar scrapes down your large blood vessels and your body sees this as an internal injury and tries to repair itself. Just like you get a scab on your skin, your platelets will stick to the blood vessel wall trying to heal it. This is why we have diabetics on low dose aspirin daily, this prevents the platelets from sticking and can prevent plaque formation. In addition, your body takes cholesterol and tries to shove it into the open wound. This is why we want your LDL, or bad  cholesterol, below 70.   The combination of platelets and cholesterol over 5-10 years forms plaque that can break off and cause a heart attack or stroke.   PLEASE REMEMBER:  Diabetes is preventable! Up to 85 percent of complications and morbidities among individuals with type 2 diabetes can be prevented, delayed, or effectively treated and minimized with regular visits to a health professional, appropriate monitoring and medication, and a healthy diet and lifestyle.   Diabetes or even increased sugars put you at 300% increased risk of heart attack and stroke.  ALSO BEING DIABETIC YOU MAY NOT HAVE ANY PAIN WITH A HEART ATTACK.  Even worse of a chance of no pain if you are a woman.  It is very unlikely that you will have any pain with a heart attack. Likely your symptoms will be very subtle, even for very severe disease.  Your symptoms for a heart attack will likely occur when you exert your self or exercise and include: Shortness of breath Sweating Nausea Dizziness Fast or irregular heart beats Fatigue   It makes me feel better if my diabetics get their heart rate up with exercise once or twice a week and pay close attention to your body. If there is ANY change in your exercise capacity or if you have symptoms above, please STOP and call 911 or call to come to the office.   PLEASE REMEMBER:  Diabetes is preventable! Up to 85 percent of complications and morbidities among individuals with type 2 diabetes can be prevented, delayed, or effectively treated and minimized with regular visits to a health professional, appropriate monitoring and medication, and a healthy diet and lifestyle.   Here is some information to help you keep your heart healthy: Move it! - Aim for 30 mins of activity every day. Take it slowly at first. Talk to us before starting any new exercise program.   Lose it.  -Body Mass Index (BMI) can indicate if you need to lose weight. A healthy range is 18.5-24.9. For a BMI  calculator, go to [employer]  Waist Management -Excess abdominal fat is a risk factor for heart disease, diabetes, asthma, stroke and more. Ideal waist circumference is less than 35" for women and less than 40" for men.   Eat Right -focus on fruits, vegetables, whole grains, and meals you make yourself. Avoid foods with trans fat and high sugar/sodium content.   Snooze or Snore? - Loud snoring can be a sign of sleep apnea, a significant risk factor for high blood pressure, heart attach, stroke, and heart arrhythmias.  Kick the habit -Quit Smoking! Avoid second hand smoke. A single cigarette raises your blood pressure for 20 mins and increases the risk of heart attack and stroke for the next 24 hours.   Are Aspirin and Supplements right for you? -Add ENTERIC COATED low dose 81 mg Aspirin daily OR  can do every other day if you have easy bruising to protect your heart and head. As well as to reduce risk of Colon Cancer by 20 %, Skin Cancer by 26 % , Melanoma by 46% and Pancreatic cancer by 60%  Say "No to Stress -There may be little you can do about problems that cause stress. However, techniques such as long walks, meditation, and exercise can help you manage it.   Start Now! - Make changes one at a time and set reasonable goals to increase your likelihood of success.     Vascular Dementia  Dementia is a condition in which a person has problems with thinking, memory, and behavior that are severe enough to interfere with daily life. Vascular dementia is a type of dementia. It results from brain damage that is caused by the brain not getting enough blood. Vascular dementia usually begins between 54 and 75 years of age. What are the causes? Vascular dementia is caused by conditions that lessen blood flow to the brain. Common causes include:  Multiple small strokes. These may happen without symptoms (silent stroke).  Major stroke.  Damage to small blood vessels in the brain (cerebral  small vessel disease). What increases the risk?  Advancing age.  Having had a stroke.  Having high blood pressure (hypertension) or high cholesterol.  Having a disease that affects the heart or blood vessels.  Smoking.  Having diabetes.  Being male.  Being obese.  Not being active.  Having depression. What are the signs or symptoms? Symptoms can vary a lot from one person to another. Symptoms may be mild or severe depending on the amount of damage and which parts of the brain have been affected. Symptoms may begin suddenly or may develop gradually. Symptoms may remain stable, or they may get worse over time. Symptoms of vascular dementia may be similar to those of Alzheimer disease. The two conditions can occur together (mixed dementia). Symptoms of vascular dementia may include: Mental  Confusion.  Memory problems.  Poor attention and concentration.  Trouble understanding speech.  Depression.  Personality changes.  Trouble recognizing familiar people.  Agitation or aggression.  Paranoia.  Delusions or hallucinations. Physical  Weakness.  Poor balance.  Loss of bladder or bowel control (incontinence).  Unsteady walking (gait).  Speaking problems. Behavioral  Getting lost in familiar places.  Problems with planning and judgment.  Trouble following instructions.  Social problems.  Emotional outbursts.  Trouble with daily activities and self-care.  Problems handling money. How is this diagnosed? There is not a specific test to diagnose vascular dementia. The health care provider will consider the person's medical history and symptoms or changes that are reported by friends and family. The health care provider will do a physical exam and may order lab tests or other tests that check brain and nervous system function. Tests that may be done include:  Blood tests.  Brain imaging tests.  Tests of movement, speech, and other daily activities  (neurological exam).  Tests of memory, thinking, and problem-solving (neuropsychological or neurocognitive testing). Diagnosis may involve several specialists. These may include a health care provider who specializes in the brain and nervous system (neurologist), a provider who specializes in disorders of the mind (psychiatrist), and a provider who focuses on speech and language changes (Doctor, general practice). How is this treated? There is no cure for vascular dementia. Brain damage that has already occurred cannot be reversed. Treatment depends on:  How severe the condition is.  Which parts of the  brain have been affected.  The person's overall health. Treatment measures aim to:  Treat the underlying cause of vascular dementia and manage risk factors. This may include: ? Controlling blood pressure. ? Lowering cholesterol. ? Treating diabetes. ? Quitting smoking. ? Losing weight.  Manage symptoms.  Prevent further brain damage.  Improve the person's health and quality of life. Treatment for dementia may involve a team of health care providers, including:  A neurologist.  A psychiatrist.  An occupational therapist.  A speech pathologist.  A cardiologist.  An exercise physiologist or physical therapist. Follow these instructions at home: Home care for a person with vascular dementia depends on what caused the condition and how severe the symptoms are. General guidelines for care at home include:  Following the health care provider's instructions for treating the condition that caused the dementia.  Using medicines only as told by the person's health care provider.  Creating a safe living space.  Learning ways to help the person remember people, appointments, and daily activities.  Finding a support group to help caregivers and family to cope with the effects of dementia.  Helping family and friends learn about ways to communicate with a person who has  dementia.  Making sure the person keeps all follow-up visits and goes to all rehabilitation appointments as told by the health care team. This is important. Contact a health care provider if:  A fever develops.  New behavioral problems develop.  Problems with swallowing develop.  Confusion gets worse.  Sleepiness gets worse. Get help right away if:  Loss of consciousness occurs.  There is a sudden loss of speech, balance, or thinking ability.  New numbness or paralysis occurs.  Sudden, severe headache occurs.  Vision is lost or suddenly gets worse in one or both eyes. This information is not intended to replace advice given to you by your health care provider. Make sure you discuss any questions you have with your health care provider. Document Released: 04/21/2002 Document Revised: 10/07/2015 Document Reviewed: 08/12/2014 Elsevier Interactive Patient Education  2019 [employer].

## 2018-08-30 ENCOUNTER — Telehealth: Payer: Self-pay | Admitting: Cardiology

## 2018-08-30 LAB — CBC WITH DIFFERENTIAL/PLATELET
Absolute Monocytes: 462 cells/uL (ref 200–950)
Basophils Absolute: 78 cells/uL (ref 0–200)
Basophils Relative: 1.2 %
Eosinophils Absolute: 371 cells/uL (ref 15–500)
Eosinophils Relative: 5.7 %
HCT: 40.1 % (ref 38.5–50.0)
Hemoglobin: 13.7 g/dL (ref 13.2–17.1)
Lymphs Abs: 3731 cells/uL (ref 850–3900)
MCH: 30.5 pg (ref 27.0–33.0)
MCHC: 34.2 g/dL (ref 32.0–36.0)
MCV: 89.3 fL (ref 80.0–100.0)
MPV: 10.5 fL (ref 7.5–12.5)
Monocytes Relative: 7.1 %
Neutro Abs: 1859 cells/uL (ref 1500–7800)
Neutrophils Relative %: 28.6 %
Platelets: 311 10*3/uL (ref 140–400)
RBC: 4.49 10*6/uL (ref 4.20–5.80)
RDW: 12.4 % (ref 11.0–15.0)
Total Lymphocyte: 57.4 %
WBC: 6.5 10*3/uL (ref 3.8–10.8)

## 2018-08-30 LAB — MAGNESIUM: Magnesium: 1.8 mg/dL (ref 1.5–2.5)

## 2018-08-30 LAB — HEMOGLOBIN A1C
Hgb A1c MFr Bld: 8.5 % of total Hgb — ABNORMAL HIGH (ref <5.7)
Mean Plasma Glucose: 197 (calc)
eAG (mmol/L): 10.9 (calc)

## 2018-08-30 LAB — LIPID PANEL
Cholesterol: 148 mg/dL (ref <200)
HDL: 38 mg/dL — ABNORMAL LOW (ref >=40)
LDL Cholesterol (Calc): 89 mg/dL (calc)
Non-HDL Cholesterol (Calc): 110 mg/dL (calc) (ref <130)
Total CHOL/HDL Ratio: 3.9 (calc) (ref <5.0)
Triglycerides: 114 mg/dL (ref <150)

## 2018-08-30 LAB — COMPLETE METABOLIC PANEL WITH GFR
AG Ratio: 1.2 (calc) (ref 1.0–2.5)
ALT: 19 U/L (ref 9–46)
AST: 19 U/L (ref 10–35)
Albumin: 4.3 g/dL (ref 3.6–5.1)
Alkaline phosphatase (APISO): 60 U/L (ref 35–144)
BUN: 19 mg/dL (ref 7–25)
CO2: 29 mmol/L (ref 20–32)
Calcium: 10.4 mg/dL — ABNORMAL HIGH (ref 8.6–10.3)
Chloride: 100 mmol/L (ref 98–110)
Creat: 0.94 mg/dL (ref 0.70–1.25)
GFR, Est African American: 98 mL/min/{1.73_m2} (ref >=60)
GFR, Est Non African American: 84 mL/min/{1.73_m2} (ref >=60)
Globulin: 3.5 g/dL (calc) (ref 1.9–3.7)
Glucose, Bld: 133 mg/dL — ABNORMAL HIGH (ref 65–99)
Potassium: 4.4 mmol/L (ref 3.5–5.3)
Sodium: 137 mmol/L (ref 135–146)
Total Bilirubin: 0.8 mg/dL (ref 0.2–1.2)
Total Protein: 7.8 g/dL (ref 6.1–8.1)

## 2018-08-30 LAB — VITAMIN D 25 HYDROXY (VIT D DEFICIENCY, FRACTURES): Vit D, 25-Hydroxy: 42 ng/mL (ref 30–100)

## 2018-08-30 LAB — TSH: TSH: 2.13 mIU/L (ref 0.40–4.50)

## 2018-08-30 NOTE — Telephone Encounter (Signed)
TELEPHONE CALL NOTE  Wayne [name] has been deemed a candidate for a follow-up tele-health visit to limit community exposure during the Covid-19 pandemic. I spoke with the patient via phone to ensure availability of phone/video source, confirm preferred email & phone number, and discuss instructions and expectations.  I reminded Wayne [name] to be prepared with any vital sign and/or heart rhythm information that could potentially be obtained via home monitoring, at the time of his visit. I reminded Wayne [name] to expect a phone call at the time of his visit if his visit.  Wayne ParrDenise [name] 08/30/2018 10:08 AM     FULL LENGTH CONSENT FOR TELE-HEALTH VISIT   I hereby voluntarily request, consent and authorize [facility] and its employed or contracted physicians, physician assistants, nurse practitioners or other licensed health care professionals (the Practitioner), to provide me with telemedicine health care services (the Services") as deemed necessary by the treating Practitioner. I acknowledge and consent to receive the Services by the Practitioner via telemedicine. I understand that the telemedicine visit will involve communicating with the Practitioner through live audiovisual communication technology and the disclosure of certain medical information by electronic transmission. I acknowledge that I have been given the opportunity to request an in-person assessment or other available alternative prior to the telemedicine visit and am voluntarily participating in the telemedicine visit.  I understand that I have the right to withhold or withdraw my consent to the use of telemedicine in the course of my care at any time, without affecting my right to future care or treatment, and that the Practitioner or I may terminate the telemedicine visit at any time. I understand that I have the right to inspect all information obtained and/or recorded in the course of the telemedicine visit and may  receive copies of available information for a reasonable fee.  I understand that some of the potential risks of receiving the Services via telemedicine include:   Delay or interruption in medical evaluation due to technological equipment failure or disruption;  Information transmitted may not be sufficient (e.g. poor resolution of images) to allow for appropriate medical decision making by the Practitioner; and/or   In rare instances, security protocols could fail, causing a breach of personal health information.  Furthermore, I acknowledge that it is my responsibility to provide information about my medical history, conditions and care that is complete and accurate to the best of my ability. I acknowledge that Practitioner's advice, recommendations, and/or decision may be based on factors not within their control, such as incomplete or inaccurate data provided by me or distortions of diagnostic images or specimens that may result from electronic transmissions. I understand that the practice of medicine is not an exact science and that Practitioner makes no warranties or guarantees regarding treatment outcomes. I acknowledge that I will receive a copy of this consent concurrently upon execution via email to the email address I last provided but may also request a printed copy by calling the office of [facility].    I understand that my insurance will be billed for this visit.   I have read or had this consent read to me.  I understand the contents of this consent, which adequately explains the benefits and risks of the Services being provided via telemedicine.   I have been provided ample opportunity to ask questions regarding this consent and the Services and have had my questions answered to my satisfaction.  I give my informed consent for the services  to be provided through the use of telemedicine in my medical care  By participating in this telemedicine visit I agree to the above.   YES        Cardiac Questionnaire:    Since your last visit or hospitalization:    1. Have you been having new or worsening chest pain? NO   2. Have you been having new or worsening shortness of breath? NO 3. Have you been having new or worsening leg swelling, wt gain, or increase in abdominal girth (pants fitting more tightly)? NO   4. Have you had any passing out spells? NO   *A YES to any of these questions would result in the appointment being kept. *If all the answers to these questions are NO, we should indicate that given the current situation regarding the worldwide coronarvirus pandemic, at the recommendation of the CDC, we are looking to limit gatherings in our waiting area, and thus will reschedule their appointment beyond four weeks from today.   _____________   COVID-19 Pre-Screening Questions:   Do you currently have a fever? NO (yes = cancel and refer to pcp for e-visit)  Have you recently travelled on a cruise, internationally, or to [place], [place], [place], [place], [place], or [place], [place] [employer]) ? NO (yes = cancel, stay home, monitor symptoms, and contact pcp or initiate e-visit if symptoms develop)  Have you been in contact with someone that is currently pending confirmation of Covid19 testing or has been confirmed to have the Covid19 virus? NO (yes = cancel, stay home, away from tested individual, monitor symptoms, and contact pcp or initiate e-visit if symptoms develop)  Are you currently experiencing fatigue or cough? NO (yes = pt should be prepared to have a mask placed at the time of their visit).

## 2018-08-30 NOTE — Telephone Encounter (Signed)
Patient returned call and Wayne [name] scheduled him for virtual visit.

## 2018-09-02 ENCOUNTER — Other Ambulatory Visit: Payer: Self-pay

## 2018-09-02 DIAGNOSIS — H2513 Age-related nuclear cataract, bilateral: Secondary | ICD-10-CM | POA: Diagnosis not present

## 2018-09-02 DIAGNOSIS — H5203 Hypermetropia, bilateral: Secondary | ICD-10-CM | POA: Diagnosis not present

## 2018-09-02 DIAGNOSIS — I10 Essential (primary) hypertension: Secondary | ICD-10-CM

## 2018-09-02 DIAGNOSIS — E119 Type 2 diabetes mellitus without complications: Secondary | ICD-10-CM | POA: Diagnosis not present

## 2018-09-02 LAB — HM DIABETES EYE EXAM

## 2018-09-02 MED ORDER — METFORMIN HCL ER 500 MG PO TB24: ORAL_TABLET | ORAL | 1 refills | Status: DC

## 2018-09-02 MED ORDER — ATENOLOL 100 MG PO TABS: ORAL_TABLET | ORAL | 1 refills | Status: DC

## 2018-09-02 NOTE — Addendum Note (Signed)
Addended by: Quentin Mulling R on: 09/02/2018 03:10 PM   Modules accepted: Orders

## 2018-09-04 ENCOUNTER — Telehealth: Payer: Self-pay | Admitting: Neurology

## 2018-09-04 NOTE — Telephone Encounter (Signed)
Due to current COVID 19 pandemic, our office is severely reducing in office visits, in order to minimize the risk to our patients and healthcare providers.  Pt understands that although there may be some limitations with this type of visit, we will take all precautions to reduce any security or privacy concerns.  Pt understands that this will be treated like an in office visit and we will file with pt's insurance, and there may be a patient responsible charge related to this service. Pt's email is [email or web address]. Pt understands that the cisco webex software must be downloaded and operational on the device pt plans to use for the visit. Pt understands that the nurse will be calling to go over pt's chart.

## 2018-09-05 ENCOUNTER — Encounter: Payer: Self-pay | Admitting: Neurology

## 2018-09-05 NOTE — Telephone Encounter (Signed)
Called the patient to review their chart and made sure that everything was up to date. Patient informed they do have the app downloaded as well as received the e-mail for the visit. Instructed to make sure they hold on to the e-mail for the upcoming appointment as it is necessary to access their appointment. Instructed the patient that apx 30 min prior to the appointment the front staff will contact them to make sure they are ready to go for their appointment in case there is any need for troubleshooting it can be completed prior to the appointment time. Pt verbalized understanding. Patient also instructed to completed the sleep scale and reply with answers to that and neck circumference measurements prior to the appointment time.    

## 2018-09-05 NOTE — Addendum Note (Signed)
Addended by: Judi Cong on: 09/05/2018 01:55 PM   Modules accepted: Orders

## 2018-09-05 NOTE — Telephone Encounter (Signed)
Called the patient to review his chart. No answer. LVM for the pt to call back.

## 2018-09-09 NOTE — Progress Notes (Signed)
Diabetes Education and Follow-Up Visit  68 y.o.male presents for diabetic education. He has Diabetes Mellitus type 2:  without complications, he is on bASA, and denies polydipsia, polyuria and visual disturbances.  Get sleep study- has phone call with Dr. Brett Fairy with tomorrow.  Followed up cardiology, Dr. Bettina Gavia- going to get coronary calcium score since he is currently asymptomatic. He denies any CP, SOB.  He states he is rather inactive, he has severe back pain, has appointment with Dr. Rita Ohara.   He has stopped sugary drinks.    Get CXR Lab Results  Component Value Date   CALCIUM 10.4 (H) 08/29/2018   CAION 1.24 05/29/2009   Last hemoglobin A1c was: Lab Results  Component Value Date   HGBA1C 8.5 (H) 08/29/2018   HGBA1C 8.0 (H) 03/19/2018   HGBA1C 6.7 (H) 11/19/2017    Current Outpatient Medications (Endocrine & Metabolic):  .  metFORMIN (GLUCOPHAGE-XR) 500 MG 24 hr tablet, TAKE 2 TABLETS BY MOUTH TWICE DAILY WITH FOOD FOR DIABETES  Current Outpatient Medications (Cardiovascular):  .  atenolol (TENORMIN) 100 MG tablet, Take 1 tablet daily for BP .  losartan-hydrochlorothiazide (HYZAAR) 100-25 MG tablet, Take 1 tablet Daily for BP .  rosuvastatin (CRESTOR) 10 MG tablet, Take 1 tablet Daily for Cholesterol   Current Outpatient Medications (Analgesics):  .  aspirin 81 MG tablet, Take 81 mg by mouth daily.   Current Outpatient Medications (Other):  .  Blood Glucose Monitoring Suppl (ONETOUCH VERIO) w/Device KIT, 1 kit by Does not apply route daily. .  Cholecalciferol (VITAMIN D3) 2000 units capsule, Take 1 capsule (2,000 Units total) by mouth daily. Takes 6000 units daily .  citalopram (CELEXA) 40 MG tablet, Take 1 tablet Daily for Mood .  glucose blood (ONETOUCH VERIO) test strip, Check blood sugar 1 time a day-DX-E11.22. .  Magnesium 400 MG CAPS, Take by mouth daily.  .  pantoprazole (PROTONIX) 40 MG tablet, Take 40 mg by mouth daily. .  Probiotic Product (PROBIOTIC  DAILY PO), Take by mouth daily.   There is no height or weight on file to calculate BMI.  Pt is on a regimen of: Metformin 2000 mg a day  Pt checks his sugars 0 x day  Glucometer:  Will message if he can not find it and needs a refill  Exercise:  Patient does not have CKD He is on ACE/ARB  Lab Results  Component Value Date   GFRAA 98 08/29/2018    Lab Results  Component Value Date   CREATININE 0.94 08/29/2018   BUN 19 08/29/2018   NA 137 08/29/2018   K 4.4 08/29/2018   CL 100 08/29/2018   CO2 29 08/29/2018    Lab Results  Component Value Date   MICROALBUR 0.6 03/19/2018     He is on a Statin.  He is not at goal of less than 70.  Lab Results  Component Value Date   CHOL 148 08/29/2018   HDL 38 (L) 08/29/2018   LDLCALC 89 08/29/2018   TRIG 114 08/29/2018   CHOLHDL 3.9 08/29/2018     Problem List has HTN (hypertension); Hyperlipidemia, mixed; GERD (gastroesophageal reflux disease); Anxiety; Type 2 diabetes mellitus with stage 2 chronic kidney disease, without long-term current use of insulin (HCC); IBS (irritable bowel syndrome); Vitamin D deficiency; Cardiac risk counseling; Obesity (BMI 30.0-34.9); Testosterone deficiency; Sleep apnea; and Former smoker on their problem list.  Medications Current Outpatient Medications on File Prior to Visit  Medication Sig  . aspirin 81 MG tablet Take  81 mg by mouth daily.  Marland Kitchen atenolol (TENORMIN) 100 MG tablet Take 1 tablet daily for BP  . Blood Glucose Monitoring Suppl (ONETOUCH VERIO) w/Device KIT 1 kit by Does not apply route daily.  . Cholecalciferol (VITAMIN D3) 2000 units capsule Take 1 capsule (2,000 Units total) by mouth daily. Takes 6000 units daily  . citalopram (CELEXA) 40 MG tablet Take 1 tablet Daily for Mood  . glucose blood (ONETOUCH VERIO) test strip Check blood sugar 1 time a day-[id number].  . losartan-hydrochlorothiazide (HYZAAR) 100-25 MG tablet Take 1 tablet Daily for BP  . Magnesium 400 MG CAPS Take by  mouth daily.   . metFORMIN (GLUCOPHAGE-XR) 500 MG 24 hr tablet TAKE 2 TABLETS BY MOUTH TWICE DAILY WITH FOOD FOR DIABETES  . pantoprazole (PROTONIX) 40 MG tablet Take 40 mg by mouth daily.  . Probiotic Product (PROBIOTIC DAILY PO) Take by mouth daily.  . rosuvastatin (CRESTOR) 10 MG tablet Take 1 tablet Daily for Cholesterol   No current facility-administered medications on file prior to visit.     ROS- see HPI  Physical Exam: There were no vitals taken for this visit. There is no height or weight on file to calculate BMI. General Appearance: Well nourished well developed, in no apparent distress.  Eyes: conjunctiva no swelling or erythema ENT/Mouth: No hoarseness, No cough for duration of visit.  Neck: Supple  Respiratory: Respiratory effort normal, normal rate, no retractions or distress.   Cardio: Appears well-perfused, noncyanotic Musculoskeletal: no obvious deformity Skin: visible skin without rashes, ecchymosis, erythema Neuro: Awake and oriented X 3,  Psych:  normal affect, Insight and Judgment appropriate.   Plan and Assessment: Diabetes Education: Reviewed 'ABCs' of diabetes management (respective goals in parentheses):  A1C (<7), blood pressure (<130/80), and cholesterol (LDL <70) Eye Exam yearly and Dental Exam every 6 months. Dietary recommendations Physical Activity recommendations - Strongly advised him to start checking sugars at different times of the day - check 2 times a day, rotating checks - WILL TRY THE FREESTYLE [place] AND DIET AND WE WILL [place] 1 MONTH - given sugar log and advised how to fill it and to bring it at next appt  - given foot care handout and explained the principles  - given instructions for hypoglycemia management   Hypercalcemia Get on CXR Will recheck May need PTH checked if still elevated  Future Appointments  Date Time Provider [place]  09/10/2018  4:00 PM  Vicie Mutters, PA-C [facility] None  09/11/2018 10:30 AM Dohmeier, Asencion Partridge, MD [facility] None  12/05/2018  4:00 PM Vicie Mutters, PA-C [facility] None  04/23/2019  3:00 PM Unk Pinto, MD [facility] None

## 2018-09-10 ENCOUNTER — Encounter: Payer: Self-pay | Admitting: Physician Assistant

## 2018-09-10 ENCOUNTER — Other Ambulatory Visit: Payer: Self-pay

## 2018-09-10 ENCOUNTER — Telehealth: Payer: PPO | Admitting: Physician Assistant

## 2018-09-10 ENCOUNTER — Telehealth (INDEPENDENT_AMBULATORY_CARE_PROVIDER_SITE_OTHER): Payer: PPO | Admitting: Cardiology

## 2018-09-10 ENCOUNTER — Encounter: Payer: Self-pay | Admitting: Cardiology

## 2018-09-10 DIAGNOSIS — N182 Chronic kidney disease, stage 2 (mild): Secondary | ICD-10-CM

## 2018-09-10 DIAGNOSIS — Z7189 Other specified counseling: Secondary | ICD-10-CM

## 2018-09-10 DIAGNOSIS — Z9189 Other specified personal risk factors, not elsewhere classified: Secondary | ICD-10-CM

## 2018-09-10 DIAGNOSIS — I10 Essential (primary) hypertension: Secondary | ICD-10-CM | POA: Diagnosis not present

## 2018-09-10 DIAGNOSIS — E782 Mixed hyperlipidemia: Secondary | ICD-10-CM

## 2018-09-10 DIAGNOSIS — E1122 Type 2 diabetes mellitus with diabetic chronic kidney disease: Secondary | ICD-10-CM

## 2018-09-10 MED ORDER — FREESTYLE LIBRE SENSOR SYSTEM MISC: 0 refills | Status: DC

## 2018-09-10 NOTE — Progress Notes (Signed)
Virtual Visit via Video Note   This visit type was conducted due to national recommendations for restrictions regarding the COVID-19 Pandemic (e.g. social distancing) in an effort to limit this patient's exposure and mitigate transmission in our community.  Due to his co-morbid illnesses, this patient is at least at moderate risk for complications without adequate follow up.  This format is felt to be most appropriate for this patient at this time.  All issues noted in this document were discussed and addressed.  A limited physical exam was performed with this format.  Please refer to the patient's chart for his consent to telehealth for [facility].   Evaluation Performed:  Follow-up visit  Date:  09/10/2018   ID:  Wayne [name], DOB 12/08/1950, MRN [id number]  Patient Location: Home Provider Location: Home  PCP:  Lucky Cowboy, MD  Cardiologist:  No primary care provider on file. Dr Dulce Sellar Electrophysiologist:  None   Chief Complaint: Cardiovascular risk evaluation  History of Present Illness:    Wayne [name] is a 68 y.o. male with hypertension, T2 DM with stage 2 CKD and hyperlipidemia last seen by me 08/18/18.  The patient does not have symptoms concerning for COVID-19 infection (fever, chills, cough, or new shortness of breath).   Last visit we discussed the merits of further evaluation it was not scheduled.  He like to go through an upper cardiac CT calcium score so we can recalculate his cardiovascular risk and if necessary intensify risk factor modification.  He has good healthcare literacy understands the utility of the test and will plan to do it in June with a decline in COVID-19.  We discussed a stress test last visit he has had no chest pain shortness of breath palpitation or syncope and will defer at this time.  His hypertension diabetes and hyperlipidemia are all treated to goal compliant with medications Past Medical History:  Diagnosis Date  . Allergy   .  Anxiety    On Xanax  . Chest pain   . Fatigue   . GERD (gastroesophageal reflux disease)   . Heart murmur    hx of MVP   . HTN (hypertension)   . Hyperlipemia   . IBS (irritable bowel syndrome)   . Medication management 06/13/2013  . Mitral valve prolapse   . Obesity (BMI 30.0-34.9) 08/20/2015  . OSA (obstructive sleep apnea)    Restated CPAP  . Sleep apnea    no cpap   . T2_NIDDM   . Testosterone deficiency 08/20/2015  . Type II or unspecified type diabetes mellitus without mention of complication, not stated as uncontrolled   . Vitamin D deficiency    Past Surgical History:  Procedure Laterality Date  . COLONOSCOPY    . TONSILLECTOMY    . WISDOM TOOTH EXTRACTION       No outpatient medications have been marked as taking for the 09/10/18 encounter (Appointment) with Baldo Daub, MD.     Allergies:   Patient has no known allergies.   Social History   Tobacco Use  . Smoking status: Former Smoker    Last attempt to quit: 09/06/2007    Years since quitting: 11.0  . Smokeless tobacco: Never Used  Substance Use Topics  . Alcohol use: Yes    Comment: occasional  . Drug use: No     Family Hx: The patient's family history includes Alzheimer's disease in his mother; Colon cancer (age of onset: 38) in his father; Dementia in his mother; Diabetes  in an other family member; Hyperlipidemia in an other family member; Hypertension in his father and another family member; Stroke in an other family member. There is no history of Colon polyps, Esophageal cancer, Rectal cancer, or Stomach cancer.  ROS:   Please see the history of present illness.     All other systems reviewed and are negative.   Prior CV studies:   The following studies were reviewed today:    Labs/Other Tests and Data Reviewed:    EKG:  No ECG reviewed.  Recent Labs: 08/29/2018: ALT 19; BUN 19; Creat 0.94; Hemoglobin 13.7; Magnesium 1.8; Platelets 311; Potassium 4.4; Sodium 137; TSH 2.13   Recent Lipid  Panel Lab Results  Component Value Date/Time   CHOL 148 08/29/2018 01:33 PM   TRIG 114 08/29/2018 01:33 PM   HDL 38 (L) 08/29/2018 01:33 PM   CHOLHDL 3.9 08/29/2018 01:33 PM   LDLCALC 89 08/29/2018 01:33 PM    Wt Readings from Last 3 Encounters:  08/29/18 238 lb (108 kg)  03/19/18 235 lb 9.6 oz (106.9 kg)  11/19/17 227 lb (103 kg)     Objective:    Vital Signs:  There were no vitals taken for this visit.  Vitals from encounters over the past 365 days    08/29/18 03/19/18 11/19/17  Vitals     BP 122/86 124/84 116/78  Temp 97.4 F (36.3 C) 97.4 F (36.3 C) 97.3 F (36.3 C)  Pulse Rate 76 83 84  Resp -- 16 --  SpO2 97 % 99 % 92 %  Height 6' (1.829 m) 6' (1.829 m) 6' (1.829 m)  Weight 238 lb (108 kg) 235 lb 9.6 oz (106.9 kg) 227 lb (103 kg)    VITAL SIGNS:  reviewed GEN:  no acute distress EYES:  sclerae anicteric, EOMI - Extraocular Movements Intact RESPIRATORY:  normal respiratory effort, symmetric expansion CARDIOVASCULAR:  no peripheral edema SKIN:  no rash, lesions or ulcers. MUSCULOSKELETAL:  no obvious deformities. NEURO:  alert and oriented x 3, no obvious focal deficit PSYCH:  normal affect  ASSESSMENT & PLAN:    1. Cardiac risk counseling for now continue current treatment aspirin high intensity statin antihypertensives and metformin he will go ahead and electively have a cardiac calcium score done in June and if he is in a high risk group consider the merits of an ischemia evaluation 2. Hypertension stable continue current treatment 3. Hyperlipidemia stable lipids are at target continue his high intensity statin  COVID-19 Education: The signs and symptoms of COVID-19 were discussed with the patient and how to seek care for testing (follow up with PCP or arrange E-visit).  The importance of social distancing was discussed today.  Time:   Today, I have spent 25 minutes with the patient with telehealth technology discussing the above problems.     Medication  Adjustments/Labs and Tests Ordered: Current medicines are reviewed at length with the patient today.  Concerns regarding medicines are outlined above.   Tests Ordered: No orders of the defined types were placed in this encounter.   Medication Changes: No orders of the defined types were placed in this encounter.   Disposition:  Follow up prn dependent on result of cardiac Ca score  Signed, Norman HerrlichBrian Shiya Fogelman, MD  09/10/2018 1:03 PM    [facility]

## 2018-09-10 NOTE — Patient Instructions (Addendum)
Medication Instructions:  Your physician recommends that you continue on your current medications as directed. Please refer to the Current Medication list given to you today.  If you need a refill on your cardiac medications before your next appointment, please call your pharmacy.   Lab work: None  If you have labs (blood work) drawn today and your tests are completely normal, you will receive your results only by: Marland Kitchen MyChart Message (if you have MyChart) OR . A paper copy in the mail If you have any lab test that is abnormal or we need to change your treatment, we will call you to review the results.  Testing/Procedures: You will have a CT calcium scoring done at the [facility] office in June 2020. You will be contacted to schedule this appointment. This test is not covered by insurance and it will cost $150 out of pocket.    Follow-Up: At [facility], you and your health needs are our priority.  As part of our continuing mission to provide you with exceptional heart care, we have created designated Provider Care Teams.  These Care Teams include your primary Cardiologist (physician) and Advanced Practice Providers (APPs -  Physician Assistants and Nurse Practitioners) who all work together to provide you with the care you need, when you need it. You will need a follow up appointment as needed if symptoms worsen or fail to improve.      Coronary Calcium Scan A coronary calcium scan is an imaging test used to look for deposits of calcium and other fatty materials (plaques) in the inner lining of the blood vessels of the heart (coronary arteries). These deposits of calcium and plaques can partly clog and narrow the coronary arteries without producing any symptoms or warning signs. This puts a person at risk for a heart attack. This test can detect these deposits before symptoms develop. Tell a health care provider about:  Any allergies you have.  All medicines you are taking,  including vitamins, herbs, eye drops, creams, and over-the-counter medicines.  Any problems you or family members have had with anesthetic medicines.  Any blood disorders you have.  Any surgeries you have had.  Any medical conditions you have.  Whether you are pregnant or may be pregnant. What are the risks? Generally, this is a safe procedure. However, problems may occur, including:  Harm to a pregnant woman and her unborn baby. This test involves the use of radiation. Radiation exposure can be dangerous to a pregnant woman and her unborn baby. If you are pregnant, you generally should not have this procedure done.  Slight increase in the risk of cancer. This is because of the radiation involved in the test. What happens before the procedure? No preparation is needed for this procedure. What happens during the procedure?   You will undress and remove any jewelry around your neck or chest.  You will put on a hospital gown.  Sticky electrodes will be placed on your chest. The electrodes will be connected to an electrocardiogram (ECG) machine to record a tracing of the electrical activity of your heart.  A CT scanner will take pictures of your heart. During this time, you will be asked to lie still and hold your breath for 2-3 seconds while a picture of your heart is being taken. The procedure may vary among health care providers and hospitals. What happens after the procedure?  You can get dressed.  You can return to your normal activities.  It is up to  you to get the results of your test. Ask your health care provider, or the department that is doing the test, when your results will be ready. Summary  A coronary calcium scan is an imaging test used to look for deposits of calcium and other fatty materials (plaques) in the inner lining of the blood vessels of the heart (coronary arteries).  Generally, this is a safe procedure. Tell your health care provider if you are pregnant  or may be pregnant.  No preparation is needed for this procedure.  A CT scanner will take pictures of your heart.  You can return to your normal activities after the scan is done. This information is not intended to replace advice given to you by your health care provider. Make sure you discuss any questions you have with your health care provider. Document Released: 10/28/2007 Document Revised: 03/20/2016 Document Reviewed: 03/20/2016 Elsevier Interactive Patient Education  2019 [employer]

## 2018-09-11 ENCOUNTER — Encounter: Payer: Self-pay | Admitting: Neurology

## 2018-09-11 ENCOUNTER — Ambulatory Visit (INDEPENDENT_AMBULATORY_CARE_PROVIDER_SITE_OTHER): Payer: PPO | Admitting: Neurology

## 2018-09-11 DIAGNOSIS — G4719 Other hypersomnia: Secondary | ICD-10-CM | POA: Diagnosis not present

## 2018-09-11 DIAGNOSIS — R0683 Snoring: Secondary | ICD-10-CM

## 2018-09-11 DIAGNOSIS — G4730 Sleep apnea, unspecified: Secondary | ICD-10-CM | POA: Diagnosis not present

## 2018-09-11 DIAGNOSIS — Z87891 Personal history of nicotine dependence: Secondary | ICD-10-CM | POA: Diagnosis not present

## 2018-09-11 DIAGNOSIS — K589 Irritable bowel syndrome without diarrhea: Secondary | ICD-10-CM

## 2018-09-11 DIAGNOSIS — E669 Obesity, unspecified: Secondary | ICD-10-CM | POA: Diagnosis not present

## 2018-09-11 DIAGNOSIS — G4752 REM sleep behavior disorder: Secondary | ICD-10-CM

## 2018-09-11 DIAGNOSIS — K219 Gastro-esophageal reflux disease without esophagitis: Secondary | ICD-10-CM

## 2018-09-11 NOTE — Progress Notes (Signed)
Virtual Visit via Video Note  I connected with Wayne [name] on 09/11/18 at 10:30 AM EDT by a video enabled telemedicine application and verified that I am speaking with the correct person using two identifiers.   I discussed the limitations of evaluation and management by telemedicine and the availability of in person appointments. The patient expressed understanding and agreed to proceed.   SLEEP MEDICINE CLINIC   Provider:  Larey Seat, MD  Primary Care Physician:  Unk Pinto, MD   Referring Provider: Unk Pinto, MD      HPI:  Wayne [name] is a 68 y.o. male , seen here as in a referral from Dr. Melford Aase for a sleep apnea evaluation.   Chief complaint according to patient :" my wife noted snoring, I stop breathing, and she sleeps in another bed room" . I fall asleep while watching Tv and she can't hear the TV" . " she reports that I act out dreams" - "I am suddenly sitting up , yelling and thrashing, seemingly defending myself".    Sleep and medical history: DM2, non insulin controlled- but gained weight Hb A!c 8.5 ) . Cardio work up for CAD evaluation pending, DR. Nudelman treated L 4-5 disc disease., HTN, GERD, weight gain, Vit D deficiency, Mitral valve prolapse   Family sleep history: alzheimer's in his mother, colom cancer in his father.    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 Misses his Gym, gains weight. He works in [place] and rises early.  No tobacco use, ETOH 2 drinks/ week, caffeine: coffee AM and through the day, 3-4 cups. Buys coffee on the road.    Sleep habits are as follows: skips breakfast , rises at 2 AM, drives off at 1.61 AM and starts working at 4 AM in [place]. Works until 2 PM and 3-5 [employer], and home by 6 PM.  Dinner time at 8 PM- wife works as Art gallery manager-  gets home at 7 PM.  Bedtime midnight- now rises at 10 AM. He has slept in a different room to  help her get sleep, he sleeps in a Cool, quiet and dark room, has  Prone sleep position, 2-3 pillows, and 2-3 nocturias. Goes right back to sleep.     Review of Systems: Out of a complete 14 system review, the patient complains of only the following symptoms, and all other reviewed systems are negative. How likely are you to doze in the following situations: 0 = not likely, 1 = slight chance, 2 = moderate chance, 3 = high chance  Sitting and Reading? Watching Television? Sitting inactive in a public place (theater or meeting)? Lying down in the afternoon when circumstances permit? Sitting and talking to someone? Sitting quietly after lunch without alcohol? In a car, while stopped for a few minutes in traffic? As a passenger in a car for an hour without a break?  Total =  Epworth score : 13    Social History   Socioeconomic History  . Marital status: Married    Spouse name: Not on file  . Number of children: Not on file  . Years of education: Not on file  . Highest education level: Not on file  Occupational History  . Not on file  Social Needs  . Financial resource strain: Not on file  . Food insecurity:    Worry: Not on file    Inability: Not on file  . Transportation needs:  Medical: Not on file    Non-medical: Not on file  Tobacco Use  . Smoking status: Former Smoker    Last attempt to quit: 09/06/2007    Years since quitting: 11.0  . Smokeless tobacco: Never Used  Substance and Sexual Activity  . Alcohol use: Yes    Comment: occasional  . Drug use: No  . Sexual activity: Not on file  Lifestyle  . Physical activity:    Days per week: Not on file    Minutes per session: Not on file  . Stress: Not on file  Relationships  . Social connections:    Talks on phone: Not on file    Gets together: Not on file    Attends religious service: Not on file    Active member of club or organization: Not on file    Attends meetings of clubs or organizations: Not on  file    Relationship status: Not on file  . Intimate partner violence:    Fear of current or ex partner: Not on file    Emotionally abused: Not on file    Physically abused: Not on file    Forced sexual activity: Not on file  Other Topics Concern  . Not on file  Social History Narrative  . Not on file    Family History  Problem Relation Age of Onset  . Stroke Other   . Hypertension Other   . Hyperlipidemia Other   . Diabetes Other   . Colon cancer Father 91  . Hypertension Father   . Dementia Mother   . Alzheimer's disease Mother   . Colon polyps Neg Hx   . Esophageal cancer Neg Hx   . Rectal cancer Neg Hx   . Stomach cancer Neg Hx     Past Medical History:  Diagnosis Date  . Allergy   . Anxiety    On Xanax  . Chest pain   . Fatigue   . GERD (gastroesophageal reflux disease)   . Heart murmur    hx of MVP   . HTN (hypertension)   . Hyperlipemia   . IBS (irritable bowel syndrome)   . Medication management 06/13/2013  . Mitral valve prolapse   . Obesity (BMI 30.0-34.9) 08/20/2015  . OSA (obstructive sleep apnea)    Restated CPAP  . Sleep apnea    no cpap   . T2_NIDDM   . Testosterone deficiency 08/20/2015  . Type II or unspecified type diabetes mellitus without mention of complication, not stated as uncontrolled   . Vitamin D deficiency     Past Surgical History:  Procedure Laterality Date  . COLONOSCOPY    . TONSILLECTOMY    . WISDOM TOOTH EXTRACTION      Current Outpatient Medications  Medication Sig Dispense Refill  . aspirin 81 MG tablet Take 81 mg by mouth daily.    Marland Kitchen atenolol (TENORMIN) 100 MG tablet Take 1 tablet daily for BP 90 tablet 1  . Blood Glucose Monitoring Suppl (ONETOUCH VERIO) w/Device KIT 1 kit by Does not apply route daily. 1 kit 0  . Cholecalciferol (VITAMIN D3) 2000 units capsule Take 1 capsule (2,000 Units total) by mouth daily. Takes 6000 units daily 90 capsule 0  . citalopram (CELEXA) 40 MG tablet Take 1 tablet Daily for Mood 90  tablet 0  . Continuous Blood Gluc Sensor (FREESTYLE LIBRE SENSOR SYSTEM) MISC Check sugars 3 x a day DX uncontrolled diabetes 1 each 0  . glucose blood (ONETOUCH VERIO) test strip  Check blood sugar 1 time a day-DX-E11.22. 100 each 4  . losartan-hydrochlorothiazide (HYZAAR) 100-25 MG tablet Take 1 tablet Daily for BP 90 tablet 0  . Magnesium 400 MG CAPS Take by mouth daily.     . metFORMIN (GLUCOPHAGE-XR) 500 MG 24 hr tablet TAKE 2 TABLETS BY MOUTH TWICE DAILY WITH FOOD FOR DIABETES 360 tablet 1  . pantoprazole (PROTONIX) 40 MG tablet Take 40 mg by mouth daily.    . Probiotic Product (PROBIOTIC DAILY PO) Take by mouth daily.    . rosuvastatin (CRESTOR) 10 MG tablet Take 1 tablet Daily for Cholesterol 90 tablet 0   No current facility-administered medications for this visit.     Allergies as of 09/11/2018  . (No Known Allergies)    Vitals: There were no vitals taken for this visit. Last Weight:  Wt Readings from Last 1 Encounters:  08/29/18 238 lb (108 kg)   ION:GEXBM is no height or weight on file to calculate BMI.     Last Height:   Ht Readings from Last 1 Encounters:  08/29/18 6' (1.829 m)    OBSERVATION :  General: The patient is awake, alert and appears not in acute distress. The patient is well groomed. Head: Normocephalic, atraumatic. Neck is supple. Mallampati 5 ,  neck circumference:17".  Nasal airflow patent , Retrognathia is seen. Facial hair noted.  Cardiovascular:  without distended neck veins. Respiratory: can hold breath over 18 seconds. Skin:  Without evidence of edema, or rash Trunk: BMI is 33 Neurologic exam : The patient is awake and alert, oriented to place and time.  Attention span & concentration ability appears normal. Speech is fluent,  without  dysarthria, dysphonia or aphasia.  Mood and affect are appropriate.  Cranial nerves: Pupils are equal and briskly reactive to light. Extraocular movements ntact and without nystagmus.   Facial motor strength is  symmetric and tongue and uvula move midline. Shoulder shrug was symmetrical.   Motor exam: muscle bulk is symmetric as is ROM in upper  extremities.  Coordination: Rapid alternating movements in the fingers/hands was normal. Finger-to-nose maneuver  normal without evidence of ataxia, dysmetria or tremor.  Gait and station: Patient walks without assistive device       I discussed the assessment and treatment plan with the patient. The patient was provided an opportunity to ask questions and all were answered. The patient agreed with the plan and demonstrated an understanding of the instructions.   The patient was advised to call back or seek an in-person evaluation if the symptoms worsen or if the condition fails to improve as anticipated.  I provided 28 minutes of non-face-to-face time during this encounter.  Larey Seat, MD   1)  HST is  ordered,     Sleep hygiene discussion-  Set bed times.   2) REM BD will need to be evaluated when lab is open we do parasomnia montage.   3) weight loss, OSA and DM, HTN overlap discussed.   Follow Up Instructions:  2-3 month RV.   Larey Seat, MD 8/41/3244, 01:02 AM  Certified in Neurology by ABPN Certified in [place] by [facility] [address] [place], [place] [address]

## 2018-09-11 NOTE — Patient Instructions (Signed)

## 2018-09-12 ENCOUNTER — Encounter: Payer: Self-pay | Admitting: *Deleted

## 2018-09-17 DIAGNOSIS — M47816 Spondylosis without myelopathy or radiculopathy, lumbar region: Secondary | ICD-10-CM | POA: Insufficient documentation

## 2018-09-17 DIAGNOSIS — M5136 Other intervertebral disc degeneration, lumbar region: Secondary | ICD-10-CM | POA: Diagnosis not present

## 2018-09-17 DIAGNOSIS — M48062 Spinal stenosis, lumbar region with neurogenic claudication: Secondary | ICD-10-CM | POA: Diagnosis not present

## 2018-09-17 DIAGNOSIS — M546 Pain in thoracic spine: Secondary | ICD-10-CM | POA: Diagnosis not present

## 2018-09-17 DIAGNOSIS — M48061 Spinal stenosis, lumbar region without neurogenic claudication: Secondary | ICD-10-CM | POA: Insufficient documentation

## 2018-09-17 DIAGNOSIS — M545 Low back pain: Secondary | ICD-10-CM | POA: Diagnosis not present

## 2018-09-25 DIAGNOSIS — M545 Low back pain: Secondary | ICD-10-CM | POA: Diagnosis not present

## 2018-09-25 DIAGNOSIS — M48062 Spinal stenosis, lumbar region with neurogenic claudication: Secondary | ICD-10-CM | POA: Diagnosis not present

## 2018-09-30 DIAGNOSIS — M48062 Spinal stenosis, lumbar region with neurogenic claudication: Secondary | ICD-10-CM | POA: Diagnosis not present

## 2018-09-30 DIAGNOSIS — M47816 Spondylosis without myelopathy or radiculopathy, lumbar region: Secondary | ICD-10-CM | POA: Diagnosis not present

## 2018-09-30 DIAGNOSIS — M5136 Other intervertebral disc degeneration, lumbar region: Secondary | ICD-10-CM | POA: Diagnosis not present

## 2018-10-03 ENCOUNTER — Telehealth: Payer: Self-pay

## 2018-10-03 NOTE — Telephone Encounter (Signed)
-----   Message from Quentin Mulling, [place] sent at 10/02/2018  5:00 PM EDT ----- Regarding: RE: question Contact: [phone] Sent him a message in [place] ----- Message ----- From: Gregery Na, CMA Sent: 10/02/2018   4:06 PM EDT To: Quentin Mulling, PA-C Subject: question                                       Patient is having a spinal injection tomorrow.  The Dr. wanted him to find out if he has any trouble with his blood sugars spike during procedure?  Also he was told that his calcium was rising, he reports he takes ROSUVASTATIN & wondered if that could be why his CALCIUM is up. Due to CALCIUM being in the medication name.   Please advise

## 2018-10-03 NOTE — Telephone Encounter (Signed)
Patient has been made aware to check his MyChart message.  Patient has viewed the message as well.

## 2018-10-08 ENCOUNTER — Ambulatory Visit: Payer: Self-pay | Admitting: Internal Medicine

## 2018-10-09 DIAGNOSIS — M4726 Other spondylosis with radiculopathy, lumbar region: Secondary | ICD-10-CM | POA: Diagnosis not present

## 2018-10-09 DIAGNOSIS — M5136 Other intervertebral disc degeneration, lumbar region: Secondary | ICD-10-CM | POA: Diagnosis not present

## 2018-10-09 DIAGNOSIS — M48061 Spinal stenosis, lumbar region without neurogenic claudication: Secondary | ICD-10-CM | POA: Diagnosis not present

## 2018-10-10 ENCOUNTER — Other Ambulatory Visit: Payer: Self-pay | Admitting: Physician Assistant

## 2018-10-17 ENCOUNTER — Ambulatory Visit (INDEPENDENT_AMBULATORY_CARE_PROVIDER_SITE_OTHER): Payer: PPO | Admitting: Neurology

## 2018-10-17 DIAGNOSIS — Z87891 Personal history of nicotine dependence: Secondary | ICD-10-CM

## 2018-10-17 DIAGNOSIS — G4752 REM sleep behavior disorder: Secondary | ICD-10-CM

## 2018-10-17 DIAGNOSIS — G4719 Other hypersomnia: Secondary | ICD-10-CM

## 2018-10-17 DIAGNOSIS — Z72821 Inadequate sleep hygiene: Secondary | ICD-10-CM

## 2018-10-17 DIAGNOSIS — G4733 Obstructive sleep apnea (adult) (pediatric): Secondary | ICD-10-CM | POA: Diagnosis not present

## 2018-10-17 DIAGNOSIS — E66811 Obesity, class 1: Secondary | ICD-10-CM

## 2018-10-17 DIAGNOSIS — K219 Gastro-esophageal reflux disease without esophagitis: Secondary | ICD-10-CM

## 2018-10-17 DIAGNOSIS — K589 Irritable bowel syndrome without diarrhea: Secondary | ICD-10-CM

## 2018-10-17 DIAGNOSIS — E669 Obesity, unspecified: Secondary | ICD-10-CM

## 2018-10-17 DIAGNOSIS — G4730 Sleep apnea, unspecified: Secondary | ICD-10-CM

## 2018-10-17 DIAGNOSIS — R0683 Snoring: Secondary | ICD-10-CM

## 2018-10-22 NOTE — Addendum Note (Signed)
Addended by: Larey Seat on: 10/22/2018 12:54 PM   Modules accepted: Orders

## 2018-10-22 NOTE — Procedures (Signed)
PATIENT'S NAME:  Wayne [name], Wayne [name] DOB:      30-Oct-1950      MR#:    [id number]     DATE OF RECORDING: 10/17/2018 REFERRING M.D.:  Unk Pinto, MD Study Performed:   Baseline Polysomnogram HISTORY:  Wayne [name] is a 68 y.o. male patient seen on 09-11-2018 by virtual visit in a referral from Dr. Melford Aase for a sleep apnea evaluation.    Chief complaint according to patient:" My wife noted snoring, I stop breathing, and she sleeps in another bed room". "I fall asleep while watching TV and she can't hear the TV with me snoring" . "She reports that I act out dreams" - "I am suddenly sitting up , yelling and thrashing, seemingly defending myself".  In addition there is fragmented sleep and excessive daytime sleepiness.    Sleep and medical history: DM2, non -insulin controlled- but gained weight, now had an Hb A1c level at 8.5 ) . Cardio work up for CAD evaluation pending, Dr. Sherwood Gambler treated L 4-5 disc disease, HTN, GERD, weight gain, Vit D deficiency, Mitral valve prolapse  No tobacco use, ETOH 2 drinks/ week, caffeine: coffee AM and through the day, 3-4 cups. Buys coffee on the road.    The patient endorsed the Epworth Sleepiness Scale at 13/24 points.   The patient's weight 238 pounds with a height of 72 (inches), resulting in a BMI of 32.2 kg/m2. The patient's neck circumference measured 17 inches.  CURRENT MEDICATIONS: Aspirin, Tenormin, Celexa, Hyzaar, Metformin, Protonix, Crestor   PROCEDURE:  This is a multichannel digital polysomnogram utilizing the Somnostar 11.2 system.  Electrodes and sensors were applied and monitored per AASM Specifications.   EEG, EOG, Chin and Limb EMG, were sampled at 200 Hz.  ECG, Snore and Nasal Pressure, Thermal Airflow, Respiratory Effort, CPAP Flow and Pressure, Oximetry was sampled at 50 Hz. Digital video and audio were recorded.      BASELINE STUDY: Lights Out was at 22:58 and Lights On at 05:01.  Total recording time (TRT) was 363 minutes, with a total sleep  time (TST) of 245.5 minutes.   The patient's sleep latency was 32 minutes.  REM latency was very prolonged at 236.5 minutes.  The sleep efficiency was 67.6 %.     SLEEP ARCHITECTURE: WASO (Wake after sleep onset) was 96 minutes.  There were 96 minutes in Stage N1, 115.5 minutes Stage N2, 29 minutes Stage N3 and 5 minutes in Stage REM.  The percentage of Stage N1 was 39.1%, Stage N2 was 47.%, Stage N3 was 11.8% and Stage R (REM sleep) was 2.0%.    RESPIRATORY ANALYSIS:  There were a total of 76 respiratory events:  52 obstructive apneas, 3 central apneas and 1 mixed apnea with a total of 56 apneas and an apnea index (AI) of 13.7 /hour. There were 20 hypopneas with a hypopnea index of 4.9 /hour.    The total APNEA/HYPOPNEA INDEX (AHI) was 18.6 /hour.  5 events occurred in REM sleep and 38 events in NREM. The REM AHI was 60 /hour, versus a non-REM AHI of 17.7. The patient spent 47.5 minutes of total sleep time in the supine position and 198 minutes in non-supine. The supine AHI was 77.0 versus a non-supine AHI of 4.5/h.  OXYGEN SATURATION & C02:  The Wake baseline 02 saturation was 96%, with the lowest being 85%. Time spent below 89% saturation equaled 2 minutes.  The arousals were noted as: 50 were spontaneous, 0 were associated with PLMs, and 53  were associated with respiratory events.  The patient had a total of 0 Periodic Limb Movements. He appeared very restless on video.  Audio and video analysis did not show any abnormal or unusual movements, behaviors, phonations or vocalizations.   The patient took one bathroom break. Snoring was noted. EKG was in keeping with normal sinus rhythm (NSR).  IMPRESSION:  1. Mild- moderate Obstructive Sleep Apnea (OSA) at AHI19/h with strong positional dependency at supine AHI of 77/h and during REM AHI was 66/h. 2. No EMG abnormality during REM sleep recorded, but REM sleep was extremely short. 3. Fragmented sleep. Partially attributed to screen light  exposure in the bedroom until 12.30 AM.     RECOMMENDATIONS:  1. Advise full-night, attended, CPAP titration study to optimize therapy and to have a chance at monitoring REM muscle tone again- With OSA treatment there may be a rebound of REM activity.     I certify that I have reviewed the entire raw data recording prior to the issuance of this report in accordance with the Standards of Accreditation of the American Academy of Sleep Medicine (AASM)    Melvyn Novasarmen Corlis Angelica, MD    10-22-2018 Diplomat, American Board of Psychiatry and Neurology  Diplomat, American Board of Sleep Medicine Medical Director, [facility]

## 2018-10-23 ENCOUNTER — Telehealth: Payer: Self-pay | Admitting: Neurology

## 2018-10-23 ENCOUNTER — Encounter: Payer: Self-pay | Admitting: Neurology

## 2018-10-23 DIAGNOSIS — R0683 Snoring: Secondary | ICD-10-CM

## 2018-10-23 DIAGNOSIS — G4730 Sleep apnea, unspecified: Secondary | ICD-10-CM

## 2018-10-23 DIAGNOSIS — G4719 Other hypersomnia: Secondary | ICD-10-CM

## 2018-10-23 NOTE — Telephone Encounter (Signed)
I called pt. I advised pt that Dr. Brett Fairy reviewed their sleep study results and found that pt has mild to moderate sleep apnea. Dr. Brett Fairy recommends that pt starts auto CPAP. I reviewed PAP compliance expectations with the pt. Pt is agreeable to starting a CPAP. I advised pt that an order will be sent to a DME, [facility], and [facility] will call the pt within about one week after they file with the pt's insurance. [facility] will show the pt how to use the machine, fit for masks, and troubleshoot the CPAP if needed. A follow up appt was made for insurance purposes with  on Aug 6,2020 at 3:30 pm with Debbora Presto, NP. Pt verbalized understanding to arrive 15 minutes early and bring their CPAP. A letter with all of this information in it will be mailed to the pt as a reminder. I verified with the pt that the address we have on file is correct. Pt verbalized understanding of results. Pt had no questions at this time but was encouraged to call back if questions arise. I have sent the order to [facility] and have received confirmation that they have received the order.  Pt had questions about sleep study and if it was covered under his insurance 100%. He states that he has not heard as to what the cost to him would be. Advised I wasn't aware of the billing part and advised I would send to our billing personnel and someone will be in contact with him. Pt verbalized understanding.

## 2018-10-23 NOTE — Telephone Encounter (Signed)
-----   Message from Larey Seat, MD sent at 10/22/2018 12:54 PM EDT ----- IMPRESSION:  1. Mild- moderate Obstructive Sleep Apnea (OSA) at AHI19/h with  strong positional dependency at supine AHI of 77/h and during REM  AHI was 66/h. 2. No EMG abnormality during REM sleep recorded, but REM sleep  was extremely short. 3. Fragmented sleep. Partially attributed to screen light  exposure in the bedroom until 12.30 AM.    RECOMMENDATIONS:  1. Advise full-night, attended, CPAP titration study to optimize  therapy and to have a chance at monitoring REM muscle tone again-  With OSA treatment there may be a rebound of REM activity.

## 2018-10-29 ENCOUNTER — Other Ambulatory Visit: Payer: Self-pay | Admitting: Internal Medicine

## 2018-10-29 DIAGNOSIS — E782 Mixed hyperlipidemia: Secondary | ICD-10-CM

## 2018-10-29 DIAGNOSIS — I10 Essential (primary) hypertension: Secondary | ICD-10-CM

## 2018-10-30 DIAGNOSIS — G4733 Obstructive sleep apnea (adult) (pediatric): Secondary | ICD-10-CM | POA: Diagnosis not present

## 2018-11-04 ENCOUNTER — Telehealth: Payer: Self-pay | Admitting: *Deleted

## 2018-11-04 NOTE — Telephone Encounter (Signed)
Script Screening patients for COVID-19 and reviewing new operational procedures  Greeting - The reason I am calling is to share with you some new changes to our processes that are designed to help us keep everyone safe. Is now a good time to speak with you? Patient says "no' - ask them when you can call back and let them know it's important to do this prior to their appointment.  Patient says "yes" - Great, ([place]) the first thing I need to do is ask you some screening Questions.  1. To the best of your knowledge, have you been in close contact with any one with a confirmed diagnosis of COVID 19? o No - proceed to next question   2. Have you had any one or more of the following: fever, chills, cough, shortness of breath or any flu-like symptoms? o No - proceed to next question  3. Have you been diagnosed with or have a previous diagnosis of COVID 19? o No - proceed to next question  4. I am going to go over a few other symptoms with you. Please let me know if you are experiencing any of the following: . Ear, nose or throat discomfort . A sore throat . Headache . Muscle pain . Diarrhea . Loss of taste or smell o No - proceed to next question   Thank you for answering these questions. Please know we will ask you these questions or similar questions when you arrive for your appointment and again it's how we are keeping everyone safe. Also, to keep you safe, please use the provided hand sanitizer when you enter the building. ([place]), we are asking everyone in the building to wear a mask because they help us prevent the spread of germs. Do you have a mask of your own, if not, we are happy to provide one for you. The last thing I want to go over with you is the no visitor guidelines. This means no one can attend the appointment with you unless you need physical assistance. I understand this may be different from your past appointments and I know this may be difficult but please know if  someone is driving you we are happy to call them for you once your appointment is over.  [INSERT SITE SPECIFIC CHECK IN PROCEDURES]  Tammy I've given you a lot of information, what questions do you have about what I've talked about today or your appointment tomorrow? 

## 2018-11-05 ENCOUNTER — Ambulatory Visit (INDEPENDENT_AMBULATORY_CARE_PROVIDER_SITE_OTHER)
Admission: RE | Admit: 2018-11-05 | Discharge: 2018-11-05 | Disposition: A | Discharge status: Home / Self Care | Payer: Self-pay | Source: Ambulatory Visit | Attending: Cardiology | Admitting: Cardiology

## 2018-11-05 ENCOUNTER — Other Ambulatory Visit: Payer: Self-pay

## 2018-11-05 DIAGNOSIS — Z9189 Other specified personal risk factors, not elsewhere classified: Secondary | ICD-10-CM

## 2018-11-06 ENCOUNTER — Telehealth: Payer: Self-pay | Admitting: *Deleted

## 2018-11-06 NOTE — Telephone Encounter (Signed)
-----   Message from Richardo Priest, MD sent at 11/06/2018  7:51 AM EDT ----- Normal or stable result  His calcium score is elevated  I advise a cardiac CTA to define if he has severe CAD  If agreeable I will enter orders

## 2018-11-06 NOTE — Telephone Encounter (Signed)
Telephone call to patient. Informed of Ct calcium score results. Agreeable t Dr. Joya Gaskins recommendation of a cardiac CTa but would like to discuss it further with Dr. Bettina Gavia. He can be reached at [phone].

## 2018-11-07 NOTE — Addendum Note (Signed)
Addended by: Shirlee More on: 11/07/2018 05:06 PM   Modules accepted: Orders

## 2018-11-08 ENCOUNTER — Encounter: Payer: Self-pay | Admitting: *Deleted

## 2018-11-08 ENCOUNTER — Telehealth: Payer: Self-pay | Admitting: *Deleted

## 2018-11-08 DIAGNOSIS — Z01812 Encounter for preprocedural laboratory examination: Secondary | ICD-10-CM

## 2018-11-08 DIAGNOSIS — I10 Essential (primary) hypertension: Secondary | ICD-10-CM

## 2018-11-08 NOTE — Telephone Encounter (Signed)
-----   Message from Richardo Priest, MD sent at 11/07/2018  5:04 PM EDT ----- Set up cardiac CTA lets really try to do this in the next 2 weeks he has a high risk calcium score and is quite apprehensive

## 2018-11-08 NOTE — Telephone Encounter (Signed)
Attempted to call patient regarding Cta. Left message to call back.

## 2018-11-08 NOTE — Telephone Encounter (Signed)
Patient returned phone call. Instructions for Cta gone over with patient. Also mailed letter with instructions to patient. Instructed patient to call if no one has reached out to him to schedule in the next 2 weeks. Patient verbalized understanding to all instruction. No further questions.

## 2018-11-08 NOTE — Telephone Encounter (Signed)
-----   Message from Brian J Munley, MD sent at 11/07/2018  5:04 PM EDT ----- Set up cardiac CTA lets really try to do this in the next 2 weeks he has a high risk calcium score and is quite apprehensive  

## 2018-11-20 ENCOUNTER — Other Ambulatory Visit (HOSPITAL_COMMUNITY): Payer: PPO

## 2018-11-21 ENCOUNTER — Ambulatory Visit (HOSPITAL_COMMUNITY)
Admission: RE | Admit: 2018-11-21 | Discharge: 2018-11-21 | Disposition: A | Discharge status: Home / Self Care | Payer: PPO | Source: Ambulatory Visit | Attending: Cardiology | Admitting: Cardiology

## 2018-11-21 ENCOUNTER — Ambulatory Visit (HOSPITAL_COMMUNITY): Admission: RE | Admit: 2018-11-21 | Payer: PPO | Source: Ambulatory Visit

## 2018-11-21 ENCOUNTER — Other Ambulatory Visit: Payer: Self-pay

## 2018-11-21 DIAGNOSIS — I2510 Atherosclerotic heart disease of native coronary artery without angina pectoris: Secondary | ICD-10-CM | POA: Diagnosis not present

## 2018-11-21 DIAGNOSIS — Z7189 Other specified counseling: Secondary | ICD-10-CM | POA: Diagnosis not present

## 2018-11-21 DIAGNOSIS — I700 Atherosclerosis of aorta: Secondary | ICD-10-CM | POA: Diagnosis not present

## 2018-11-21 LAB — POCT I-STAT CREATININE: Creatinine, Ser: 0.8 mg/dL (ref 0.61–1.24)

## 2018-11-21 MED ORDER — NITROGLYCERIN 0.4 MG SL SUBL
0.8000 mg | SUBLINGUAL_TABLET | Freq: Once | SUBLINGUAL | Status: AC
  Administered 2018-11-21: 16:00:00 0.8 mg via SUBLINGUAL

## 2018-11-21 MED ORDER — NITROGLYCERIN 0.4 MG SL SUBL
SUBLINGUAL_TABLET | SUBLINGUAL | Status: AC
  Administered 2018-11-21: 16:00:00 0.8 mg via SUBLINGUAL
  Filled 2018-11-21: qty 2

## 2018-11-21 MED ORDER — IOHEXOL 350 MG/ML SOLN
100.0000 mL | Freq: Once | INTRAVENOUS | Status: AC | PRN
  Administered 2018-11-21: 100 mL via INTRAVENOUS

## 2018-11-21 MED ORDER — METOPROLOL TARTRATE 5 MG/5ML IV SOLN
5.0000 mg | INTRAVENOUS | Status: DC | PRN
  Filled 2018-11-21: qty 5

## 2018-11-21 MED ORDER — METOPROLOL TARTRATE 5 MG/5ML IV SOLN
INTRAVENOUS | Status: AC
  Filled 2018-11-21: qty 10

## 2018-11-29 DIAGNOSIS — G4733 Obstructive sleep apnea (adult) (pediatric): Secondary | ICD-10-CM | POA: Diagnosis not present

## 2018-12-05 ENCOUNTER — Encounter: Payer: Self-pay | Admitting: Physician Assistant

## 2018-12-05 ENCOUNTER — Ambulatory Visit (INDEPENDENT_AMBULATORY_CARE_PROVIDER_SITE_OTHER): Payer: PPO | Admitting: Physician Assistant

## 2018-12-05 ENCOUNTER — Other Ambulatory Visit: Payer: Self-pay

## 2018-12-05 VITALS — BP 140/72 | HR 68 | Temp 97.6°F | Ht 72.0 in | Wt 228.0 lb

## 2018-12-05 DIAGNOSIS — E559 Vitamin D deficiency, unspecified: Secondary | ICD-10-CM | POA: Diagnosis not present

## 2018-12-05 DIAGNOSIS — E782 Mixed hyperlipidemia: Secondary | ICD-10-CM

## 2018-12-05 DIAGNOSIS — N182 Chronic kidney disease, stage 2 (mild): Secondary | ICD-10-CM

## 2018-12-05 DIAGNOSIS — E66811 Obesity, class 1: Secondary | ICD-10-CM

## 2018-12-05 DIAGNOSIS — E1122 Type 2 diabetes mellitus with diabetic chronic kidney disease: Secondary | ICD-10-CM | POA: Diagnosis not present

## 2018-12-05 DIAGNOSIS — I10 Essential (primary) hypertension: Secondary | ICD-10-CM | POA: Diagnosis not present

## 2018-12-05 DIAGNOSIS — Z79899 Other long term (current) drug therapy: Secondary | ICD-10-CM

## 2018-12-05 DIAGNOSIS — E349 Endocrine disorder, unspecified: Secondary | ICD-10-CM | POA: Diagnosis not present

## 2018-12-05 DIAGNOSIS — E669 Obesity, unspecified: Secondary | ICD-10-CM

## 2018-12-05 NOTE — Patient Instructions (Signed)
Check out  Mini habits for weight loss book  2 apps for tracking food is myfitness pal  loseit OR can take picture of your food     When it comes to diets, agreement about the perfect plan isn't easy to find, even among the experts. Experts at the [employer] developed an idea known as the Healthy Eating Plate. Just imagine a plate divided into logical, healthy portions.  The emphasis is on diet quality:  Load up on vegetables and fruits - one-half of your plate: Aim for color and variety, and remember that potatoes don't count.  Go for whole grains - one-quarter of your plate: Whole wheat, barley, wheat berries, quinoa, oats, brown rice, and foods made with them. If you want pasta, go with whole wheat pasta.  Protein power - one-quarter of your plate: Fish, chicken, beans, and nuts are all healthy, versatile protein sources. Limit red meat.  The diet, however, does go beyond the plate, offering a few other suggestions.  Use healthy plant oils, such as olive, canola, soy, corn, sunflower and peanut. Check the labels, and avoid partially hydrogenated oil, which have unhealthy trans fats.  If you're thirsty, drink water. Coffee and tea are good in moderation, but skip sugary drinks and limit milk and dairy products to one or two daily servings.  The type of carbohydrate in the diet is more important than the amount. Some sources of carbohydrates, such as vegetables, fruits, whole grains, and beans-are healthier than others.  Finally, stay active.  

## 2018-12-05 NOTE — Progress Notes (Signed)
FOLLOW UP  Assessment and Plan:   Hypertension Well controlled with current medications; on ARB Monitor blood pressure at home; patient to call if consistently greater than 130/80 Continue DASH diet.   Reminder to go to the ER if any CP, SOB, nausea, dizziness, severe HA, changes vision/speech, left arm numbness and tingling and jaw pain.  Cholesterol Continue statin medication Continue low cholesterol diet and exercise.  Check lipid panel.   Diabetes without complications Continue medication: metformin  Continue diet and exercise.  Perform daily foot/skin check, notify office of any concerning changes.  Check A1C  Anxiety Well managed by current regimen;  Stress management techniques discussed, increase water, good sleep hygiene discussed, increase exercise, and increase veggies.   Obesity with co morbidities Long discussion about weight loss, diet, and exercise Discussed ideal weight for height and initial weight goal Patient will work on portion sizes and gentle exercise Will follow up in 3 months  Vitamin D Def/ osteoporosis prevention Continue supplementation; increased dose as indicated for goal of 70-100 Defer Vit D level per pt req    Continue diet and meds as discussed. Further disposition pending results of labs. Discussed med's effects and SE's.   Over 30 minutes of exam, counseling, chart review, and critical decision making was performed.   Future Appointments  Date Time Provider Adeline  12/19/2018  3:30 PM Debbora Presto, NP [facility] None  04/23/2019  3:00 PM Unk Pinto, MD [facility] None    ----------------------------------------------------------------------------------------------------------------------  HPI 68 y.o. male  presents for 3 month follow up on hypertension, cholesterol, T2 diabetes tr  he has a diagnosis of anxiety and is currently prescribed celexa 40 mg daily, reports symptoms are well controlled on current regimen.   He  is on CPAP machine x 45 days, states he is breathing better, having less headaches and has more energy when he wakes up.  Coronary calcium score 278, then had CTA with about 50 % at LAD.   BMI is Body mass index is 30.92 kg/m., he has been working on diet and exercises intermittently- somewhat limited recently by back pain and work schedule. Wt Readings from Last 3 Encounters:  12/05/18 228 lb (103.4 kg)  08/29/18 238 lb (108 kg)  03/19/18 235 lb 9.6 oz (106.9 kg)   he has a diagnosis of GERD which is currently managed by protonix 40 mg daily; at the last visit patient had reported poor control of symptoms despite use of OTC ranitidine/famodipine. he reports symptoms is currently well controlled, and denies breakthrough reflux, burning in chest, hoarseness or cough.    His blood pressure has been controlled at home, today their BP is BP: 140/72  He does not workout. He denies chest pain, shortness of breath, dizziness.   He is on cholesterol medication and denies myalgias. His cholesterol is at goal. The cholesterol last visit was:   Lab Results  Component Value Date   CHOL 148 08/29/2018   HDL 38 (L) 08/29/2018   LDLCALC 89 08/29/2018   TRIG 114 08/29/2018   CHOLHDL 3.9 08/29/2018    He has been working on diet and exercise for T2 diabetes (on metformin 2000 mg daily), has been using the [place] and doing very well with his sugars, has lost 10 lbs, and denies increased appetite, nausea, paresthesia of the feet, polydipsia, polyuria, visual disturbances and vomiting. Last A1C in the office was:  Lab Results  Component Value Date   HGBA1C 8.5 (H) 08/29/2018   Patient is on Vitamin D  supplement but remained below goal at the last visit:    Lab Results  Component Value Date   VD25OH 42 08/29/2018       Current Medications:  Current Outpatient Medications on File Prior to Visit  Medication Sig  . aspirin 81 MG tablet Take 81 mg by mouth daily.  Marland Kitchen atenolol (TENORMIN) 100 MG tablet  Take 1 tablet daily for BP  . Blood Glucose Monitoring Suppl (ONETOUCH VERIO) w/Device KIT 1 kit by Does not apply route daily.  . Cholecalciferol (VITAMIN D3) 2000 units capsule Take 1 capsule (2,000 Units total) by mouth daily. Takes 6000 units daily  . citalopram (CELEXA) 40 MG tablet Take 1 tablet Daily for Mood  . Continuous Blood Gluc Sensor (FREESTYLE LIBRE 14 DAY SENSOR) MISC USE TO CHECK BLOOD SUGARS THREE TIMES A DAY AS DIRECTED  . glucose blood (ONETOUCH VERIO) test strip Check blood sugar 1 time a day-DX-E11.22.  . losartan-hydrochlorothiazide (HYZAAR) 100-25 MG tablet Take 1 tablet Daily for BP  . Magnesium 400 MG CAPS Take by mouth daily.   . metFORMIN (GLUCOPHAGE-XR) 500 MG 24 hr tablet TAKE 2 TABLETS BY MOUTH TWICE DAILY WITH FOOD FOR DIABETES  . pantoprazole (PROTONIX) 40 MG tablet Take 40 mg by mouth daily.  . Probiotic Product (PROBIOTIC DAILY PO) Take by mouth daily.  . rosuvastatin (CRESTOR) 10 MG tablet Take 1 tablet Daily for Cholesterol   No current facility-administered medications on file prior to visit.      Allergies: No Known Allergies   Medical History:  Past Medical History:  Diagnosis Date  . Allergy   . Anxiety    On Xanax  . Chest pain   . Fatigue   . GERD (gastroesophageal reflux disease)   . Heart murmur    hx of MVP   . HTN (hypertension)   . Hyperlipemia   . IBS (irritable bowel syndrome)   . Medication management 06/13/2013  . Mitral valve prolapse   . Obesity (BMI 30.0-34.9) 08/20/2015  . OSA (obstructive sleep apnea)    Restated CPAP  . Sleep apnea    no cpap   . T2_NIDDM   . Testosterone deficiency 08/20/2015  . Type II or unspecified type diabetes mellitus without mention of complication, not stated as uncontrolled   . Vitamin D deficiency    Family history- Reviewed and unchanged Social history- Reviewed and unchanged   Review of Systems:  Review of Systems  Constitutional: Negative for malaise/fatigue and weight loss.  HENT:  Negative for hearing loss and tinnitus.   Eyes: Negative for blurred vision and double vision.  Respiratory: Negative for cough, shortness of breath and wheezing.   Cardiovascular: Negative for chest pain, palpitations, orthopnea, claudication and leg swelling.  Gastrointestinal: Negative for abdominal pain, blood in stool, constipation, diarrhea, heartburn, melena, nausea and vomiting.  Genitourinary: Negative.   Musculoskeletal: Negative for joint pain and myalgias.  Skin: Negative for rash.  Neurological: Negative for dizziness, tingling, sensory change, weakness and headaches.  Endo/Heme/Allergies: Negative for polydipsia.  Psychiatric/Behavioral: Negative for depression and substance abuse. The patient is nervous/anxious (Rare, after bad day at work) and has insomnia (Rare, related to anxiety/stress).   All other systems reviewed and are negative.   Physical Exam: BP 140/72   Pulse 68   Temp 97.6 F (36.4 C)   Ht 6' (1.829 m)   Wt 228 lb (103.4 kg)   SpO2 99%   BMI 30.92 kg/m  Wt Readings from Last 3 Encounters:  12/05/18 228  lb (103.4 kg)  08/29/18 238 lb (108 kg)  03/19/18 235 lb 9.6 oz (106.9 kg)   General Appearance: Well nourished, in no apparent distress. Eyes: PERRLA, EOMs, conjunctiva no swelling or erythema Sinuses: No Frontal/maxillary tenderness ENT/Mouth: Ext aud canals clear, TMs without erythema, bulging. No erythema, swelling, or exudate on post pharynx.  Tonsils not swollen or erythematous. Hearing normal.  Neck: Supple, thyroid normal.  Respiratory: Respiratory effort normal, BS equal bilaterally without rales, rhonchi, wheezing or stridor.  Cardio: RRR with no MRGs. Brisk peripheral pulses without edema.  Abdomen: Soft, + BS.  Non tender, no guarding, rebound, hernias, masses. Lymphatics: Non tender without lymphadenopathy.  Musculoskeletal: Full ROM, 5/5 strength, Normal gait Skin: Warm, dry without rashes, lesions, ecchymosis.  Neuro: Cranial nerves  intact. No cerebellar symptoms.  Psych: Awake and oriented X 3, normal affect, Insight and Judgment appropriate.    Vicie Mutters, PA-C 4:35 PM [facility]

## 2018-12-06 DIAGNOSIS — M48062 Spinal stenosis, lumbar region with neurogenic claudication: Secondary | ICD-10-CM | POA: Diagnosis not present

## 2018-12-06 DIAGNOSIS — M5136 Other intervertebral disc degeneration, lumbar region: Secondary | ICD-10-CM | POA: Diagnosis not present

## 2018-12-06 DIAGNOSIS — M47816 Spondylosis without myelopathy or radiculopathy, lumbar region: Secondary | ICD-10-CM | POA: Diagnosis not present

## 2018-12-06 DIAGNOSIS — Z6830 Body mass index (BMI) 30.0-30.9, adult: Secondary | ICD-10-CM | POA: Diagnosis not present

## 2018-12-06 LAB — CBC WITH DIFFERENTIAL/PLATELET
Absolute Monocytes: 569 cells/uL (ref 200–950)
Basophils Absolute: 88 cells/uL (ref 0–200)
Basophils Relative: 1.2 %
Eosinophils Absolute: 234 cells/uL (ref 15–500)
Eosinophils Relative: 3.2 %
HCT: 36.6 % — ABNORMAL LOW (ref 38.5–50.0)
Hemoglobin: 12.7 g/dL — ABNORMAL LOW (ref 13.2–17.1)
Lymphs Abs: 3599 cells/uL (ref 850–3900)
MCH: 31.4 pg (ref 27.0–33.0)
MCHC: 34.7 g/dL (ref 32.0–36.0)
MCV: 90.4 fL (ref 80.0–100.0)
MPV: 10.6 fL (ref 7.5–12.5)
Monocytes Relative: 7.8 %
Neutro Abs: 2811 cells/uL (ref 1500–7800)
Neutrophils Relative %: 38.5 %
Platelets: 298 10*3/uL (ref 140–400)
RBC: 4.05 10*6/uL — ABNORMAL LOW (ref 4.20–5.80)
RDW: 12.7 % (ref 11.0–15.0)
Total Lymphocyte: 49.3 %
WBC: 7.3 10*3/uL (ref 3.8–10.8)

## 2018-12-06 LAB — HEMOGLOBIN A1C
Hgb A1c MFr Bld: 7 % of total Hgb — ABNORMAL HIGH (ref <5.7)
Mean Plasma Glucose: 154 (calc)
eAG (mmol/L): 8.5 (calc)

## 2018-12-06 LAB — COMPLETE METABOLIC PANEL WITH GFR
AG Ratio: 1.5 (calc) (ref 1.0–2.5)
ALT: 14 U/L (ref 9–46)
AST: 17 U/L (ref 10–35)
Albumin: 4.4 g/dL (ref 3.6–5.1)
Alkaline phosphatase (APISO): 42 U/L (ref 35–144)
BUN: 21 mg/dL (ref 7–25)
CO2: 27 mmol/L (ref 20–32)
Calcium: 10.4 mg/dL — ABNORMAL HIGH (ref 8.6–10.3)
Chloride: 101 mmol/L (ref 98–110)
Creat: 0.91 mg/dL (ref 0.70–1.25)
GFR, Est African American: 101 mL/min/{1.73_m2} (ref >=60)
GFR, Est Non African American: 88 mL/min/{1.73_m2} (ref >=60)
Globulin: 3 g/dL (calc) (ref 1.9–3.7)
Glucose, Bld: 84 mg/dL (ref 65–99)
Potassium: 4.2 mmol/L (ref 3.5–5.3)
Sodium: 138 mmol/L (ref 135–146)
Total Bilirubin: 0.6 mg/dL (ref 0.2–1.2)
Total Protein: 7.4 g/dL (ref 6.1–8.1)

## 2018-12-06 LAB — MAGNESIUM: Magnesium: 1.9 mg/dL (ref 1.5–2.5)

## 2018-12-06 LAB — TSH: TSH: 1.17 mIU/L (ref 0.40–4.50)

## 2018-12-06 LAB — LIPID PANEL
Cholesterol: 149 mg/dL (ref <200)
HDL: 45 mg/dL (ref >=40)
LDL Cholesterol (Calc): 89 mg/dL (calc)
Non-HDL Cholesterol (Calc): 104 mg/dL (calc) (ref <130)
Total CHOL/HDL Ratio: 3.3 (calc) (ref <5.0)
Triglycerides: 63 mg/dL (ref <150)

## 2018-12-06 LAB — VITAMIN D 25 HYDROXY (VIT D DEFICIENCY, FRACTURES): Vit D, 25-Hydroxy: 42 ng/mL (ref 30–100)

## 2018-12-19 ENCOUNTER — Encounter: Payer: Self-pay | Admitting: Family Medicine

## 2018-12-19 ENCOUNTER — Ambulatory Visit (INDEPENDENT_AMBULATORY_CARE_PROVIDER_SITE_OTHER): Payer: PPO | Admitting: Family Medicine

## 2018-12-19 ENCOUNTER — Other Ambulatory Visit: Payer: Self-pay

## 2018-12-19 VITALS — BP 115/78 | HR 74 | Temp 97.5°F | Ht 72.0 in | Wt 226.8 lb

## 2018-12-19 DIAGNOSIS — G4730 Sleep apnea, unspecified: Secondary | ICD-10-CM

## 2018-12-19 DIAGNOSIS — Z9989 Dependence on other enabling machines and devices: Secondary | ICD-10-CM

## 2018-12-19 DIAGNOSIS — G4733 Obstructive sleep apnea (adult) (pediatric): Secondary | ICD-10-CM

## 2018-12-19 NOTE — Progress Notes (Signed)
PATIENT: Wayne [name] DOB: [birth year]  REASON FOR VISIT: follow up HISTORY FROM: patient  Chief Complaint  Patient presents with  . Follow-up    Room 3, alone. OSA/CPAP      HISTORY OF PRESENT ILLNESS: Today 12/19/18 Wayne [name] is a 68 y.o. male here today for follow up.  He is doing very well on CPAP therapy.  He is using his machine every night.  Compliance data reported 11/18/2018 through 12/17/2018 reveals that he is using CPAP nightly for compliance 90%.  25 of the last 30 days he used a greater than 4 hours for compliance of 83%.  AHI was slightly elevated at 11 on 5 to 15 cm of water and EPR of 3.  There was no significant leak.  Average pressure was 11.5.   HISTORY: (copied from Dr Dohmeier's note on 09/11/2018)  HPI:  Wayne [name] is a 68 y.o. male , seen here as in a referral from Dr. Melford Aase for a sleep apnea evaluation.   Chief complaint according to patient :" my wife noted snoring, I stop breathing, and she sleeps in another bed room" . I fall asleep while watching Tv and she can't hear the TV" . " she reports that I act out dreams" - "I am suddenly sitting up , yelling and thrashing, seemingly defending myself".    Sleep and medical history: DM2, non insulin controlled- but gained weight Hb A!c 8.5 ) . Cardio work up for CAD evaluation pending, DR. Nudelman treated L 4-5 disc disease., HTN, GERD, weight gain, Vit D deficiency, Mitral valve prolapse   Family sleep history: alzheimer's in his mother, colom cancer in his father.    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 Misses his Gym, gains weight. He works in [place] and rises early.  No tobacco use, ETOH 2 drinks/ week, caffeine: coffee AM and through the day, 3-4 cups. Buys coffee on the road.    Sleep habits are as follows: skips breakfast , rises at 2 AM, drives off at 4.15 AM and starts working at 4 AM in [place]. Works until  2 PM and 3-5 [employer], and home by 6 PM.  Dinner time at 8 PM- wife works as Art gallery manager-  gets home at 7 PM.  Bedtime midnight- now rises at 10 AM. He has slept in a different room to help her get sleep, he sleeps in a Cool, quiet and dark room, has  Prone sleep position, 2-3 pillows, and 2-3 nocturias. Goes right back to sleep.    REVIEW OF SYSTEMS: Out of a complete 14 system review of symptoms, the patient complains only of the following symptoms, headache and all other reviewed systems are negative.  Fatigue severity scale: 34 Epworth sleepiness scale: 13  ALLERGIES: No Known Allergies  HOME MEDICATIONS: Outpatient Medications Prior to Visit  Medication Sig Dispense Refill  . aspirin 81 MG tablet Take 81 mg by mouth daily.    Marland Kitchen atenolol (TENORMIN) 100 MG tablet Take 1 tablet daily for BP 90 tablet 1  . Blood Glucose Monitoring Suppl (ONETOUCH VERIO) w/Device KIT 1 kit by Does not apply route daily. 1 kit 0  . Cholecalciferol (VITAMIN D3) 2000 units capsule Take 1 capsule (2,000 Units total) by mouth daily. Takes 6000 units daily 90 capsule 0  . citalopram (CELEXA) 40 MG tablet Take 1 tablet Daily for Mood 90 tablet 0  . Continuous Blood Gluc Sensor (FREESTYLE LIBRE  14 DAY SENSOR) MISC USE TO CHECK BLOOD SUGARS THREE TIMES A DAY AS DIRECTED 3 each 3  . diclofenac (VOLTAREN) 75 MG EC tablet     . glucose blood (ONETOUCH VERIO) test strip Check blood sugar 1 time a day-DX-E11.22. 100 each 4  . losartan-hydrochlorothiazide (HYZAAR) 100-25 MG tablet Take 1 tablet Daily for BP 90 tablet 0  . Magnesium 400 MG CAPS Take by mouth daily.     . metFORMIN (GLUCOPHAGE-XR) 500 MG 24 hr tablet TAKE 2 TABLETS BY MOUTH TWICE DAILY WITH FOOD FOR DIABETES 360 tablet 1  . pantoprazole (PROTONIX) 40 MG tablet Take 40 mg by mouth daily.    . Probiotic Product (PROBIOTIC DAILY PO) Take by mouth daily.    . rosuvastatin (CRESTOR) 10 MG tablet Take 1 tablet Daily for Cholesterol 90 tablet 0   No  facility-administered medications prior to visit.     PAST MEDICAL HISTORY: Past Medical History:  Diagnosis Date  . Allergy   . Anxiety    On Xanax  . Chest pain   . Fatigue   . GERD (gastroesophageal reflux disease)   . Heart murmur    hx of MVP   . HTN (hypertension)   . Hyperlipemia   . IBS (irritable bowel syndrome)   . Medication management 06/13/2013  . Mitral valve prolapse   . Obesity (BMI 30.0-34.9) 08/20/2015  . OSA (obstructive sleep apnea)    Restated CPAP  . Sleep apnea    no cpap   . T2_NIDDM   . Testosterone deficiency 08/20/2015  . Type II or unspecified type diabetes mellitus without mention of complication, not stated as uncontrolled   . Vitamin D deficiency     PAST SURGICAL HISTORY: Past Surgical History:  Procedure Laterality Date  . COLONOSCOPY    . TONSILLECTOMY    . WISDOM TOOTH EXTRACTION      FAMILY HISTORY: Family History  Problem Relation Age of Onset  . Stroke Other   . Hypertension Other   . Hyperlipidemia Other   . Diabetes Other   . Colon cancer Father 52  . Hypertension Father   . Dementia Mother   . Alzheimer's disease Mother   . Colon polyps Neg Hx   . Esophageal cancer Neg Hx   . Rectal cancer Neg Hx   . Stomach cancer Neg Hx     SOCIAL HISTORY: Social History   Socioeconomic History  . Marital status: Married    Spouse name: Not on file  . Number of children: Not on file  . Years of education: Not on file  . Highest education level: Not on file  Occupational History  . Not on file  Social Needs  . Financial resource strain: Not on file  . Food insecurity    Worry: Not on file    Inability: Not on file  . Transportation needs    Medical: Not on file    Non-medical: Not on file  Tobacco Use  . Smoking status: Former Smoker    Quit date: 09/06/2007    Years since quitting: 11.2  . Smokeless tobacco: Never Used  Substance and Sexual Activity  . Alcohol use: Yes    Comment: occasional  . Drug use: No  .  Sexual activity: Not on file  Lifestyle  . Physical activity    Days per week: Not on file    Minutes per session: Not on file  . Stress: Not on file  Relationships  . Social connections  Talks on phone: Not on file    Gets together: Not on file    Attends religious service: Not on file    Active member of club or organization: Not on file    Attends meetings of clubs or organizations: Not on file    Relationship status: Not on file  . Intimate partner violence    Fear of current or ex partner: Not on file    Emotionally abused: Not on file    Physically abused: Not on file    Forced sexual activity: Not on file  Other Topics Concern  . Not on file  Social History Narrative  . Not on file      PHYSICAL EXAM  Vitals:   12/19/18 1538  BP: 115/78  Pulse: 74  Temp: (!) 97.5 F (36.4 C)  Weight: 226 lb 12.8 oz (102.9 kg)  Height: 6' (1.829 m)   Body mass index is 30.76 kg/m.  Generalized: Well developed, in no acute distress  Neurological examination  Mentation: Alert oriented to time, place, history taking. Follows all commands speech and language fluent Cranial nerve II-XII: Pupils were equal round reactive to light. Extraocular movements were full, visual field were full on confrontational test. Facial sensation and strength were normal. Uvula tongue midline. Head turning and shoulder shrug  were normal and symmetric. Motor: The motor testing reveals 5 over 5 strength of all 4 extremities. Good symmetric motor tone is noted throughout.  Gait and station: Gait is normal.   DIAGNOSTIC DATA (LABS, IMAGING, TESTING) - I reviewed patient records, labs, notes, testing and imaging myself where available.  No flowsheet data found.   Lab Results  Component Value Date   WBC 7.3 12/05/2018   HGB 12.7 (L) 12/05/2018   HCT 36.6 (L) 12/05/2018   MCV 90.4 12/05/2018   PLT 298 12/05/2018      Component Value Date/Time   NA 138 12/05/2018 1647   K 4.2 12/05/2018 1647    CL 101 12/05/2018 1647   CO2 27 12/05/2018 1647   GLUCOSE 84 12/05/2018 1647   BUN 21 12/05/2018 1647   CREATININE 0.91 12/05/2018 1647   CALCIUM 10.4 (H) 12/05/2018 1647   PROT 7.4 12/05/2018 1647   ALBUMIN 4.1 10/16/2016 1704   AST 17 12/05/2018 1647   ALT 14 12/05/2018 1647   ALKPHOS 57 10/16/2016 1704   BILITOT 0.6 12/05/2018 1647   GFRNONAA 88 12/05/2018 1647   GFRAA 101 12/05/2018 1647   Lab Results  Component Value Date   CHOL 149 12/05/2018   HDL 45 12/05/2018   LDLCALC 89 12/05/2018   TRIG 63 12/05/2018   CHOLHDL 3.3 12/05/2018   Lab Results  Component Value Date   HGBA1C 7.0 (H) 12/05/2018   Lab Results  Component Value Date   VITAMINB12 395 03/19/2018   Lab Results  Component Value Date   TSH 1.17 12/05/2018     ASSESSMENT AND PLAN 68 y.o. year old male  has a past medical history of Allergy, Anxiety, Chest pain, Fatigue, GERD (gastroesophageal reflux disease), Heart murmur, HTN (hypertension), Hyperlipemia, IBS (irritable bowel syndrome), Medication management (06/13/2013), Mitral valve prolapse, Obesity (BMI 30.0-34.9) (08/20/2015), OSA (obstructive sleep apnea), Sleep apnea, T2_NIDDM, Testosterone deficiency (08/20/2015), Type II or unspecified type diabetes mellitus without mention of complication, not stated as uncontrolled, and Vitamin D deficiency. here with     ICD-10-CM   1. OSA on CPAP  G47.33 For home use only DME continuous positive airway pressure (CPAP)   Z99.89  2. Sleep apnea, unspecified type  G47.30     He is doing very well on CPAP therapy.  He is using his CPAP nightly with optimal compliance.  We have discussed need to continue CPAP nightly and for greater than 4 hours each night.  AHI is elevated at 11.  Sleep study showed strong positional dependency with supine AHI of 77/h, during REM AHI was 66/h.  We will adjust auto titration from 5 to 15 cm of water to 6 to 16 cm of water.  He was advised that we download in 1 month.  We have discussed  potential need for titration study in the future if AHI remains elevated.  He verbalizes understanding and agreement with this plan.   Orders Placed This Encounter  Procedures  . For home use only DME continuous positive airway pressure (CPAP)    Please adjust pressure setting auto titrate pressure min 6cmH20 - max 16cmH20 with EPR 3    Order Specific Question:   Length of Need    Answer:   Lifetime    Order Specific Question:   Patient has OSA or probable OSA    Answer:   Yes    Order Specific Question:   Is the patient currently using CPAP in the home    Answer:   Yes    Order Specific Question:   Settings    Answer:   Other see comments    Order Specific Question:   CPAP supplies needed    Answer:   Mask, headgear, cushions, filters, heated tubing and water chamber     No orders of the defined types were placed in this encounter.     I spent 15 minutes with the patient. 50% of this time was spent counseling and educating patient on plan of care and medications.    Debbora Presto, FNP-C 12/19/2018, 4:10 PM [facility] [address], [place], [place] [address] [phone]

## 2018-12-19 NOTE — Patient Instructions (Signed)
Continue CPAP nightly and for greater than 4 hours each night  We will increase pressure settings.   Repeat download in 1 month to assess events, may need to adjust if they remain elevated  Follow up in 6 months  Sleep Apnea Sleep apnea affects breathing during sleep. It causes breathing to stop for a short time or to become shallow. It can also increase the risk of:  Heart attack.  Stroke.  Being very overweight (obese).  Diabetes.  Heart failure.  Irregular heartbeat. The goal of treatment is to help you breathe normally again. What are the causes? There are three kinds of sleep apnea:  Obstructive sleep apnea. This is caused by a blocked or collapsed airway.  Central sleep apnea. This happens when the brain does not send the right signals to the muscles that control breathing.  Mixed sleep apnea. This is a combination of obstructive and central sleep apnea. The most common cause of this condition is a collapsed or blocked airway. This can happen if:  Your throat muscles are too relaxed.  Your tongue and tonsils are too large.  You are overweight.  Your airway is too small. What increases the risk?  Being overweight.  Smoking.  Having a small airway.  Being older.  Being male.  Drinking alcohol.  Taking medicines to calm yourself (sedatives or tranquilizers).  Having family members with the condition. What are the signs or symptoms?  Trouble staying asleep.  Being sleepy or tired during the day.  Getting angry a lot.  Loud snoring.  Headaches in the morning.  Not being able to focus your mind (concentrate).  Forgetting things.  Less interest in sex.  Mood swings.  Personality changes.  Feelings of sadness (depression).  Waking up a lot during the night to pee (urinate).  Dry mouth.  Sore throat. How is this diagnosed?  Your medical history.  A physical exam.  A test that is done when you are sleeping (sleep study). The test  is most often done in a sleep lab but may also be done at home. How is this treated?   Sleeping on your side.  Using a medicine to get rid of mucus in your nose (decongestant).  Avoiding the use of alcohol, medicines to help you relax, or certain pain medicines (narcotics).  Losing weight, if needed.  Changing your diet.  Not smoking.  Using a machine to open your airway while you sleep, such as: ? An oral appliance. This is a mouthpiece that shifts your lower jaw forward. ? A CPAP device. This device blows air through a mask when you breathe out (exhale). ? An EPAP device. This has valves that you put in each nostril. ? A BPAP device. This device blows air through a mask when you breathe in (inhale) and breathe out.  Having surgery if other treatments do not work. It is important to get treatment for sleep apnea. Without treatment, it can lead to:  High blood pressure.  Coronary artery disease.  In men, not being able to have an erection (impotence).  Reduced thinking ability. Follow these instructions at home: Lifestyle  Make changes that your doctor recommends.  Eat a healthy diet.  Lose weight if needed.  Avoid alcohol, medicines to help you relax, and some pain medicines.  Do not use any products that contain nicotine or tobacco, such as cigarettes, e-cigarettes, and chewing tobacco. If you need help quitting, ask your doctor. General instructions  Take over-the-counter and prescription medicines only  as told by your doctor.  If you were given a machine to use while you sleep, use it only as told by your doctor.  If you are having surgery, make sure to tell your doctor you have sleep apnea. You may need to bring your device with you.  Keep all follow-up visits as told by your doctor. This is important. Contact a doctor if:  The machine that you were given to use during sleep bothers you or does not seem to be working.  You do not get better.  You get  worse. Get help right away if:  Your chest hurts.  You have trouble breathing in enough air.  You have an uncomfortable feeling in your back, arms, or stomach.  You have trouble talking.  One side of your body feels weak.  A part of your face is hanging down. These symptoms may be an emergency. Do not wait to see if the symptoms will go away. Get medical help right away. Call your local emergency services (911 in the [place] Do not drive yourself to the hospital. Summary  This condition affects breathing during sleep.  The most common cause is a collapsed or blocked airway.  The goal of treatment is to help you breathe normally while you sleep. This information is not intended to replace advice given to you by your health care provider. Make sure you discuss any questions you have with your health care provider. Document Released: 02/08/2008 Document Revised: 02/15/2018 Document Reviewed: 12/25/2017 Elsevier Patient Education  2020 [employer]

## 2018-12-26 ENCOUNTER — Telehealth: Payer: Self-pay | Admitting: Cardiology

## 2018-12-26 NOTE — Telephone Encounter (Signed)
Became aware that his FFR had not been reported I reached out to Dr. Collier Salina admission here interpreted his flow reserve is normal and I called Wayne [name] to tell him his LAD stenosis is not flow restrictive I would call clinically mild he is not having angina we will continue medical treatment and he is pleased to hear the results.

## 2018-12-30 DIAGNOSIS — G4733 Obstructive sleep apnea (adult) (pediatric): Secondary | ICD-10-CM | POA: Diagnosis not present

## 2019-01-05 ENCOUNTER — Telehealth: Payer: Self-pay | Admitting: Cardiology

## 2019-01-05 NOTE — Telephone Encounter (Signed)
I phoned and requested Dr. Ena Dawley reviewed the cardiac CTA and FFR with me on 01/03/2019.Marland Kitchen  She sent me a photo of the FFR and there is no flow-limiting stenosis in the left anterior descending coronary artery.  Recommendation was for addressing risk factors and clinical follow-up.  The decreased FFR seen in the distal vessel is not indicative of anatomic focal stenosis.  There is no change in my recommendation to the patient.

## 2019-01-22 ENCOUNTER — Telehealth: Payer: Self-pay | Admitting: Family Medicine

## 2019-01-22 NOTE — Telephone Encounter (Signed)
Message has been sent to [facility]. Waiting for them to respond.

## 2019-01-22 NOTE — Telephone Encounter (Signed)
We sent orders to DME to increase min pressure to 6 and max pressure to 16cmH20. His new compliance report shows old pressure and AHI is still elevated. Can you check to see if this was received by DME?

## 2019-01-23 NOTE — Telephone Encounter (Signed)
Received a message from Charmian Muff with [facility] that the order is being processed.

## 2019-01-25 ENCOUNTER — Other Ambulatory Visit: Payer: Self-pay | Admitting: Internal Medicine

## 2019-01-25 DIAGNOSIS — I10 Essential (primary) hypertension: Secondary | ICD-10-CM

## 2019-01-25 DIAGNOSIS — E782 Mixed hyperlipidemia: Secondary | ICD-10-CM

## 2019-01-25 MED ORDER — ROSUVASTATIN CALCIUM 10 MG PO TABS: ORAL_TABLET | ORAL | 1 refills | Status: DC

## 2019-01-25 MED ORDER — CITALOPRAM HYDROBROMIDE 40 MG PO TABS: ORAL_TABLET | ORAL | 1 refills | Status: DC

## 2019-01-25 MED ORDER — MAGNESIUM 400 MG PO CAPS: ORAL_CAPSULE | ORAL | Status: AC

## 2019-01-30 DIAGNOSIS — G4733 Obstructive sleep apnea (adult) (pediatric): Secondary | ICD-10-CM | POA: Diagnosis not present

## 2019-02-11 DIAGNOSIS — G4733 Obstructive sleep apnea (adult) (pediatric): Secondary | ICD-10-CM | POA: Diagnosis not present

## 2019-03-01 DIAGNOSIS — G4733 Obstructive sleep apnea (adult) (pediatric): Secondary | ICD-10-CM | POA: Diagnosis not present

## 2019-03-14 DIAGNOSIS — M5136 Other intervertebral disc degeneration, lumbar region: Secondary | ICD-10-CM | POA: Diagnosis not present

## 2019-03-14 DIAGNOSIS — M48062 Spinal stenosis, lumbar region with neurogenic claudication: Secondary | ICD-10-CM | POA: Diagnosis not present

## 2019-03-14 DIAGNOSIS — M47816 Spondylosis without myelopathy or radiculopathy, lumbar region: Secondary | ICD-10-CM | POA: Diagnosis not present

## 2019-03-14 DIAGNOSIS — Z6831 Body mass index (BMI) 31.0-31.9, adult: Secondary | ICD-10-CM | POA: Diagnosis not present

## 2019-03-19 ENCOUNTER — Encounter: Payer: Self-pay | Admitting: Internal Medicine

## 2019-03-27 ENCOUNTER — Telehealth: Payer: Self-pay | Admitting: Family Medicine

## 2019-03-27 DIAGNOSIS — M48061 Spinal stenosis, lumbar region without neurogenic claudication: Secondary | ICD-10-CM | POA: Diagnosis not present

## 2019-03-27 DIAGNOSIS — M5136 Other intervertebral disc degeneration, lumbar region: Secondary | ICD-10-CM | POA: Diagnosis not present

## 2019-03-27 DIAGNOSIS — G4733 Obstructive sleep apnea (adult) (pediatric): Secondary | ICD-10-CM

## 2019-03-27 DIAGNOSIS — M4726 Other spondylosis with radiculopathy, lumbar region: Secondary | ICD-10-CM | POA: Diagnosis not present

## 2019-03-27 NOTE — Telephone Encounter (Signed)
Please let Wayne [name] know that I have pulled another compliance report.  His AHI remains elevated at 13.2.  There seems to be higher number of central apneas as well.  I feel that he would be best served to come back in for a titration study if he is willing.  We can order a test that will be able to find the exact settings that would be best for his machine.  If he is willing please let me know and I will place an order for a CPAP titration study.  Compliance report dated 01/12/2019 through 02/10/2019 reveals residual AHI of 13.2.  8.6 central and 3.4 obstructive.  Auto titration of 6 to 16 cm of water.  Pressure in the 95th percentile 11.4, maximum 12.8.

## 2019-03-31 NOTE — Telephone Encounter (Signed)
Pt returned call. Please call back when available. 

## 2019-03-31 NOTE — Telephone Encounter (Signed)
LMVM for pt to return call for results of cpap download.

## 2019-03-31 NOTE — Telephone Encounter (Signed)
I spoke to pt and relayed to him results of cpap download.  He is ok to get cpap titration.  Please order.

## 2019-04-01 ENCOUNTER — Other Ambulatory Visit: Payer: Self-pay | Admitting: Family Medicine

## 2019-04-01 DIAGNOSIS — G4733 Obstructive sleep apnea (adult) (pediatric): Secondary | ICD-10-CM | POA: Diagnosis not present

## 2019-04-01 NOTE — Addendum Note (Signed)
Addended by: Darleen Crocker on: 04/01/2019 09:24 AM   Modules accepted: Orders

## 2019-04-01 NOTE — Telephone Encounter (Signed)
Order placed for CPAP titration for the patient.  

## 2019-04-01 NOTE — Telephone Encounter (Signed)
Can you help me place titration order for Wayne [name] please? TY!

## 2019-04-15 ENCOUNTER — Other Ambulatory Visit: Payer: Self-pay | Admitting: Internal Medicine

## 2019-04-15 ENCOUNTER — Other Ambulatory Visit: Payer: Self-pay | Admitting: Physician Assistant

## 2019-04-15 DIAGNOSIS — I10 Essential (primary) hypertension: Secondary | ICD-10-CM

## 2019-04-15 DIAGNOSIS — E119 Type 2 diabetes mellitus without complications: Secondary | ICD-10-CM

## 2019-04-23 ENCOUNTER — Encounter: Payer: Self-pay | Admitting: Internal Medicine

## 2019-04-23 ENCOUNTER — Other Ambulatory Visit: Payer: Self-pay

## 2019-04-23 ENCOUNTER — Ambulatory Visit (INDEPENDENT_AMBULATORY_CARE_PROVIDER_SITE_OTHER): Payer: PPO | Admitting: Internal Medicine

## 2019-04-23 VITALS — BP 110/72 | HR 84 | Temp 97.5°F | Resp 16 | Ht 72.0 in | Wt 225.6 lb

## 2019-04-23 DIAGNOSIS — Z79899 Other long term (current) drug therapy: Secondary | ICD-10-CM

## 2019-04-23 DIAGNOSIS — L918 Other hypertrophic disorders of the skin: Secondary | ICD-10-CM

## 2019-04-23 DIAGNOSIS — Z1211 Encounter for screening for malignant neoplasm of colon: Secondary | ICD-10-CM

## 2019-04-23 DIAGNOSIS — Z23 Encounter for immunization: Secondary | ICD-10-CM

## 2019-04-23 DIAGNOSIS — Z0000 Encounter for general adult medical examination without abnormal findings: Secondary | ICD-10-CM | POA: Diagnosis not present

## 2019-04-23 DIAGNOSIS — E785 Hyperlipidemia, unspecified: Secondary | ICD-10-CM | POA: Diagnosis not present

## 2019-04-23 DIAGNOSIS — I10 Essential (primary) hypertension: Secondary | ICD-10-CM

## 2019-04-23 DIAGNOSIS — N401 Enlarged prostate with lower urinary tract symptoms: Secondary | ICD-10-CM

## 2019-04-23 DIAGNOSIS — Z136 Encounter for screening for cardiovascular disorders: Secondary | ICD-10-CM | POA: Diagnosis not present

## 2019-04-23 DIAGNOSIS — E1122 Type 2 diabetes mellitus with diabetic chronic kidney disease: Secondary | ICD-10-CM

## 2019-04-23 DIAGNOSIS — Z8249 Family history of ischemic heart disease and other diseases of the circulatory system: Secondary | ICD-10-CM

## 2019-04-23 DIAGNOSIS — Z87891 Personal history of nicotine dependence: Secondary | ICD-10-CM | POA: Diagnosis not present

## 2019-04-23 DIAGNOSIS — E1169 Type 2 diabetes mellitus with other specified complication: Secondary | ICD-10-CM

## 2019-04-23 DIAGNOSIS — Z125 Encounter for screening for malignant neoplasm of prostate: Secondary | ICD-10-CM | POA: Diagnosis not present

## 2019-04-23 DIAGNOSIS — G4733 Obstructive sleep apnea (adult) (pediatric): Secondary | ICD-10-CM

## 2019-04-23 DIAGNOSIS — Z1212 Encounter for screening for malignant neoplasm of rectum: Secondary | ICD-10-CM

## 2019-04-23 DIAGNOSIS — Z0001 Encounter for general adult medical examination with abnormal findings: Secondary | ICD-10-CM

## 2019-04-23 DIAGNOSIS — N182 Chronic kidney disease, stage 2 (mild): Secondary | ICD-10-CM | POA: Diagnosis not present

## 2019-04-23 DIAGNOSIS — E559 Vitamin D deficiency, unspecified: Secondary | ICD-10-CM

## 2019-04-23 NOTE — Patient Instructions (Signed)

## 2019-04-23 NOTE — Progress Notes (Signed)
Annual  Screening/Preventative Visit  & Comprehensive Evaluation & Examination     This very nice 68 y.o. MBM presents for a Screening /Preventative Visit & comprehensive evaluation and management of multiple medical co-morbidities.  Patient has been followed for HTN, HLD, T2_NIDDM  and Vitamin D Deficiency. Patient's GERD is controlled with his meds & diety.      HTN predates since 1989. Patient's BP has been controlled at home.  Today's BP is at goal - 110/72. Patient denies any cardiac symptoms as chest pain, palpitations, shortness of breath, dizziness or ankle swelling.     Patient's hyperlipidemia is controlled with diet and Rosuvastatin. Patient denies myalgias or other medication SE's. Last lipids were at goal:  Lab Results  Component Value Date   CHOL 149 12/05/2018   HDL 45 12/05/2018   LDLCALC 89 12/05/2018   TRIG 63 12/05/2018   CHOLHDL 3.3 12/05/2018      Patient has moderate obesity (BMI 31+) and consequent  T2_NIDDM (2007) w/CKD2 and patient denies reactive hypoglycemic symptoms, visual blurring, diabetic polys or paresthesias. Last A1c was not at goal:  Lab Results  Component Value Date   HGBA1C 7.0 (H) 12/05/2018        In 2015, patient had Low T of "209" / 2015 and "154" / 2016 and he declined treatment.     Finally, patient has history of Vitamin D Deficiency ("22" / 2008 and ""34" / 2019) and last vitamin D was still low:  Lab Results  Component Value Date   VD25OH 108 12/05/2018   Current Outpatient Medications on File Prior to Visit  Medication Sig  . aspirin 81 MG tablet Take 81 mg by mouth daily.  Marland Kitchen atenolol (TENORMIN) 100 MG tablet Take 1 tablet daily for BP  . Blood Glucose Monitoring Suppl (ONETOUCH VERIO) w/Device KIT 1 kit by Does not apply route daily.  . citalopram (CELEXA) 40 MG tablet Take 1 tablet Daily for Mood  . Continuous Blood Gluc Sensor (FREESTYLE LIBRE 14 DAY SENSOR) MISC USE TO CHECK BLOOD SUGARS THREE TIMES A DAY AS DIRECTED  .  diclofenac (VOLTAREN) 75 MG EC tablet   . glucose blood (ONETOUCH VERIO) test strip Check blood sugar 1 time a day-[id number].  . losartan-hydrochlorothiazide (HYZAAR) 100-25 MG tablet Take 1 tablet Daily for BP an Fluid Retention  / Ankle Swelling  . Magnesium 400 MG CAPS Take 1 capsuile Daily  . metFORMIN (GLUCOPHAGE-XR) 500 MG 24 hr tablet Take 2 tablets 2 x /day with Meals for Diabetes  . pantoprazole (PROTONIX) 40 MG tablet Take 40 mg by mouth daily.  . Probiotic Product (PROBIOTIC DAILY PO) Take by mouth daily.  . rosuvastatin (CRESTOR) 10 MG tablet Take 1 tablet Daily for Cholesterol  . VITAMIN D PO Take 5,000 Units by mouth. Takes about 1 time a week.   No current facility-administered medications on file prior to visit.    No Known Allergies   Past Medical History:  Diagnosis Date  . Allergy   . Anxiety    On Xanax  . Chest pain   . Fatigue   . GERD (gastroesophageal reflux disease)   . Heart murmur    hx of MVP   . HTN (hypertension)   . Hyperlipemia   . IBS (irritable bowel syndrome)   . Medication management 06/13/2013  . Mitral valve prolapse   . Obesity (BMI 30.0-34.9) 08/20/2015  . OSA (obstructive sleep apnea)    Restated CPAP  . Sleep apnea  no cpap   . T2_NIDDM   . Testosterone deficiency 08/20/2015  . Type II or unspecified type diabetes mellitus without mention of complication, not stated as uncontrolled   . Vitamin D deficiency    Health Maintenance  Topic Date Due  . INFLUENZA VACCINE  12/14/2018  . PNA vac Low Risk Adult (2 of 2 - PPSV23) 03/20/2019  . HEMOGLOBIN A1C  06/07/2019  . OPHTHALMOLOGY EXAM  09/02/2019  . FOOT EXAM  04/22/2020  . COLONOSCOPY  03/30/2022  . TETANUS/TDAP  01/31/2024  . Hepatitis C Screening  Completed   Immunization History  Administered Date(s) Administered  . Influenza Inj Mdck Quad With Preservative 01/29/2017  . Influenza Split 03/01/2015  . Influenza, High Dose Seasonal PF 03/19/2018  . Influenza,inj,quad, With  Preservative 03/03/2016  . PPD Test 01/30/2014, 01/29/2017, 03/19/2018  . Pneumococcal Conjugate-13 03/19/2018  . Pneumococcal Polysaccharide-23 05/21/2008  . Td 10/30/2003  . Tdap 01/30/2014   Last Colon - 03/30/2017 - Dr Ardis Hughs - recommended  f/u 5 years due Nov 2023  For  (+) FHx.   Past Surgical History:  Procedure Laterality Date  . COLONOSCOPY    . TONSILLECTOMY    . WISDOM TOOTH EXTRACTION     Family History  Problem Relation Age of Onset  . Stroke Other   . Hypertension Other   . Hyperlipidemia Other   . Diabetes Other   . Colon cancer Father 59  . Hypertension Father   . Dementia Mother   . Alzheimer's disease Mother   . Colon polyps Neg Hx   . Esophageal cancer Neg Hx   . Rectal cancer Neg Hx   . Stomach cancer Neg Hx    Social History   Socioeconomic History  . Marital status: Married    Spouse name: Not on file  . Number of children: None  Occupational History  . Not on file  Tobacco Use  . Smoking status: Former Smoker    Quit date: 09/06/2007    Years since quitting: 11.6  . Smokeless tobacco: Never Used  Substance and Sexual Activity  . Alcohol use: Yes    Comment: occasional  . Drug use: No  . Sexual activity: Not on file    ROS Constitutional: Denies fever, chills, weight loss/gain, headaches, insomnia,  night sweats or change in appetite. Does c/o fatigue. Eyes: Denies redness, blurred vision, diplopia, discharge, itchy or watery eyes.  ENT: Denies discharge, congestion, post nasal drip, epistaxis, sore throat, earache, hearing loss, dental pain, Tinnitus, Vertigo, Sinus pain or snoring.  Cardio: Denies chest pain, palpitations, irregular heartbeat, syncope, dyspnea, diaphoresis, orthopnea, PND, claudication or edema Respiratory: denies cough, dyspnea, DOE, pleurisy, hoarseness, laryngitis or wheezing.  Gastrointestinal: Denies dysphagia, heartburn, reflux, water brash, pain, cramps, nausea, vomiting, bloating, diarrhea, constipation,  hematemesis, melena, hematochezia, jaundice or hemorrhoids Genitourinary: Denies dysuria, frequency, urgency, nocturia, hesitancy, discharge, hematuria or flank pain Musculoskeletal: Denies arthralgia, myalgia, stiffness, Jt. Swelling, pain, limp or strain/sprain. Denies Falls. Skin: Denies puritis, rash, hives, warts, acne, eczema or change in skin lesion Neuro: No weakness, tremor, incoordination, spasms, paresthesia or pain Psychiatric: Denies confusion, memory loss or sensory loss. Denies Depression. Endocrine: Denies change in weight, skin, hair change, nocturia, and paresthesia, diabetic polys, visual blurring or hyper / hypo glycemic episodes.  Heme/Lymph: No excessive bleeding, bruising or enlarged lymph nodes.  Physical Exam  BP 110/72   Pulse 84   Temp (!) 97.5 F (36.4 C)   Resp 16   Ht 6' (1.829 m)  Wt 225 lb 9.6 oz (102.3 kg)   BMI 30.60 kg/m   General Appearance: Well nourished and well groomed and in no apparent distress.  Eyes: PERRLA, EOMs, conjunctiva no swelling or erythema, normal fundi and vessels. Sinuses: No frontal/maxillary tenderness ENT/Mouth: EACs patent / TMs  nl. Nares clear without erythema, swelling, mucoid exudates. Oral hygiene is good. No erythema, swelling, or exudate. Tongue normal, non-obstructing. Tonsils not swollen or erythematous. Hearing normal.  Neck: Supple, thyroid not palpable. No bruits, nodes or JVD. Respiratory: Respiratory effort normal.  BS equal and clear bilateral without rales, rhonci, wheezing or stridor. Cardio: Heart sounds are normal with regular rate and rhythm and no murmurs, rubs or gallops. Peripheral pulses are normal and equal bilaterally without edema. No aortic or femoral bruits. Chest: symmetric with normal excursions and percussion.  Abdomen: Soft, with Nl bowel sounds. Nontender, no guarding, rebound, hernias, masses, or organomegaly.  Lymphatics: Non tender without lymphadenopathy.  Musculoskeletal: Full ROM all  peripheral extremities, joint stability, 5/5 strength, and normal gait. Skin: Warm and dry without rashes, lesions, cyanosis, clubbing or  ecchymosis. #2 inflamed 1 cm skin tags - in the presacral intergluteal area and the lateral mid thigh.  Procedure ([id number])    After informed consent and aseptic prep with alcohol and 1/2 ml of  Marcaine 0.5% to each, both lesions were excised by cutting hyfrecation. Then antibiotic ung and sterile bandaids were applied. Patient was instructed in po wound care.   Neuro: Cranial nerves intact, reflexes equal bilaterally. Normal muscle tone, no cerebellar symptoms. Sensation intact.  Pysch: Alert and oriented X 3 with normal affect, insight and judgment appropriate.   Assessment and Plan  1. Annual Preventative/Screening Exam   2. Essential hypertension  - EKG 12-Lead - [place], retroperitnl abd,  ltd - Urinalysis, Routine w reflex microscopic - Microalbumin / Creatinine Urine Ratio - CBC with Diff - COMPLETE METABOLIC PANEL WITH GFR - Magnesium - TSH  3. Hyperlipidemia associated with type 2 diabetes mellitus (HCC)  - EKG 12-Lead - [place], retroperitnl abd,  ltd - Lipid Profile - TSH  4. Type 2 diabetes mellitus with stage 2 chronic kidney disease, without long-term current use of insulin (HCC)  - EKG 12-Lead - [place], retroperitnl abd,  ltd - Urinalysis, Routine w reflex microscopic - Microalbumin / Creatinine Urine Ratio - HM DIABETES FOOT EXAM - LOW EXTREMITY NEUR EXAM DOCUM - Hemoglobin A1c ([facility]) - Insulin, random  5. Vitamin D deficiency  - Vitamin D (25 hydroxy)  6. OSA (obstructive sleep apnea)   7. Benign localized prostatic hyperplasia with lower urinary tract symptoms (LUTS)  - PSA  8. Screening for colorectal cancer  - POC Hemoccult Bld/Stl   9. Screening for prostate cancer  - PSA  10. Screening for ischemic heart disease (IHD)  - EKG 12-Lead  11. FH: hypertension  - EKG 12-Lead - [place], retroperitnl abd,  ltd   12. Former smoker  - EKG 12-Lead - [place], retroperitnl abd,  ltd  71. Screening for AAA (aortic abdominal aneurysm)  - [place], retroperitnl abd,  ltd  14. Medication management  - Urinalysis, Routine w reflex microscopic - Microalbumin / Creatinine Urine Ratio - CBC with Diff - COMPLETE METABOLIC PANEL WITH GFR - Magnesium - Lipid Profile - TSH - Hemoglobin A1c ([facility]) - Insulin, random - Vitamin D (25 hydroxy)        Patient was counseled in prudent diet, weight control to achieve/maintain BMI less than 25, BP monitoring, regular exercise and medications as  discussed.  Discussed med effects and SE's. Routine screening labs and tests as requested with regular follow-up as recommended. Over 40 minutes of exam, counseling, chart review and high complex critical decision making was performed   Kirtland Bouchard, MD

## 2019-04-24 LAB — LIPID PANEL
Cholesterol: 155 mg/dL (ref <200)
HDL: 46 mg/dL (ref >=40)
LDL Cholesterol (Calc): 92 mg/dL (calc)
Non-HDL Cholesterol (Calc): 109 mg/dL (calc) (ref <130)
Total CHOL/HDL Ratio: 3.4 (calc) (ref <5.0)
Triglycerides: 80 mg/dL (ref <150)

## 2019-04-24 LAB — HEMOGLOBIN A1C
Hgb A1c MFr Bld: 7.5 % of total Hgb — ABNORMAL HIGH (ref <5.7)
Mean Plasma Glucose: 169 (calc)
eAG (mmol/L): 9.3 (calc)

## 2019-04-24 LAB — COMPLETE METABOLIC PANEL WITH GFR
AG Ratio: 1.3 (calc) (ref 1.0–2.5)
ALT: 20 U/L (ref 9–46)
AST: 20 U/L (ref 10–35)
Albumin: 4.2 g/dL (ref 3.6–5.1)
Alkaline phosphatase (APISO): 50 U/L (ref 35–144)
BUN: 19 mg/dL (ref 7–25)
CO2: 30 mmol/L (ref 20–32)
Calcium: 9.9 mg/dL (ref 8.6–10.3)
Chloride: 101 mmol/L (ref 98–110)
Creat: 1.01 mg/dL (ref 0.70–1.25)
GFR, Est African American: 89 mL/min/{1.73_m2} (ref >=60)
GFR, Est Non African American: 77 mL/min/{1.73_m2} (ref >=60)
Globulin: 3.2 g/dL (calc) (ref 1.9–3.7)
Glucose, Bld: 106 mg/dL — ABNORMAL HIGH (ref 65–99)
Potassium: 4.2 mmol/L (ref 3.5–5.3)
Sodium: 139 mmol/L (ref 135–146)
Total Bilirubin: 0.5 mg/dL (ref 0.2–1.2)
Total Protein: 7.4 g/dL (ref 6.1–8.1)

## 2019-04-24 LAB — CBC WITH DIFFERENTIAL/PLATELET
Absolute Monocytes: 391 cells/uL (ref 200–950)
Basophils Absolute: 57 cells/uL (ref 0–200)
Basophils Relative: 0.8 %
Eosinophils Absolute: 227 cells/uL (ref 15–500)
Eosinophils Relative: 3.2 %
HCT: 40.1 % (ref 38.5–50.0)
Hemoglobin: 13.4 g/dL (ref 13.2–17.1)
Lymphs Abs: 4367 cells/uL — ABNORMAL HIGH (ref 850–3900)
MCH: 30.7 pg (ref 27.0–33.0)
MCHC: 33.4 g/dL (ref 32.0–36.0)
MCV: 91.8 fL (ref 80.0–100.0)
MPV: 10.2 fL (ref 7.5–12.5)
Monocytes Relative: 5.5 %
Neutro Abs: 2059 cells/uL (ref 1500–7800)
Neutrophils Relative %: 29 %
Platelets: 271 10*3/uL (ref 140–400)
RBC: 4.37 10*6/uL (ref 4.20–5.80)
RDW: 12.2 % (ref 11.0–15.0)
Total Lymphocyte: 61.5 %
WBC: 7.1 10*3/uL (ref 3.8–10.8)

## 2019-04-24 LAB — URINALYSIS, ROUTINE W REFLEX MICROSCOPIC
Bilirubin Urine: NEGATIVE
Glucose, UA: NEGATIVE
Hgb urine dipstick: NEGATIVE
Ketones, ur: NEGATIVE
Leukocytes,Ua: NEGATIVE
Nitrite: NEGATIVE
Protein, ur: NEGATIVE
Specific Gravity, Urine: 1.024 (ref 1.001–1.03)
pH: 5.5 (ref 5.0–8.0)

## 2019-04-24 LAB — MAGNESIUM: Magnesium: 1.7 mg/dL (ref 1.5–2.5)

## 2019-04-24 LAB — TSH: TSH: 1.51 mIU/L (ref 0.40–4.50)

## 2019-04-24 LAB — MICROALBUMIN / CREATININE URINE RATIO
Creatinine, Urine: 194 mg/dL (ref 20–320)
Microalb Creat Ratio: 3 mcg/mg creat (ref <30)
Microalb, Ur: 0.6 mg/dL

## 2019-04-24 LAB — VITAMIN D 25 HYDROXY (VIT D DEFICIENCY, FRACTURES): Vit D, 25-Hydroxy: 36 ng/mL (ref 30–100)

## 2019-04-24 LAB — PSA: PSA: 1.8 ng/mL (ref <=4.0)

## 2019-04-24 LAB — INSULIN, RANDOM: Insulin: 7.2 u[IU]/mL

## 2019-04-28 ENCOUNTER — Other Ambulatory Visit (HOSPITAL_COMMUNITY): Payer: Self-pay

## 2019-05-01 DIAGNOSIS — G4733 Obstructive sleep apnea (adult) (pediatric): Secondary | ICD-10-CM | POA: Diagnosis not present

## 2019-05-13 ENCOUNTER — Other Ambulatory Visit: Payer: Self-pay | Admitting: Physician Assistant

## 2019-05-13 DIAGNOSIS — I10 Essential (primary) hypertension: Secondary | ICD-10-CM

## 2019-06-01 DIAGNOSIS — G4733 Obstructive sleep apnea (adult) (pediatric): Secondary | ICD-10-CM | POA: Diagnosis not present

## 2019-06-09 ENCOUNTER — Inpatient Hospital Stay (HOSPITAL_COMMUNITY): Admission: RE | Admit: 2019-06-09 | Payer: Self-pay | Source: Ambulatory Visit

## 2019-06-13 DIAGNOSIS — M48062 Spinal stenosis, lumbar region with neurogenic claudication: Secondary | ICD-10-CM | POA: Diagnosis not present

## 2019-06-13 DIAGNOSIS — M47816 Spondylosis without myelopathy or radiculopathy, lumbar region: Secondary | ICD-10-CM | POA: Diagnosis not present

## 2019-06-13 DIAGNOSIS — M5136 Other intervertebral disc degeneration, lumbar region: Secondary | ICD-10-CM | POA: Diagnosis not present

## 2019-07-02 DIAGNOSIS — G4733 Obstructive sleep apnea (adult) (pediatric): Secondary | ICD-10-CM | POA: Diagnosis not present

## 2019-07-10 DIAGNOSIS — M4726 Other spondylosis with radiculopathy, lumbar region: Secondary | ICD-10-CM | POA: Diagnosis not present

## 2019-07-10 DIAGNOSIS — M5136 Other intervertebral disc degeneration, lumbar region: Secondary | ICD-10-CM | POA: Diagnosis not present

## 2019-07-10 DIAGNOSIS — M48061 Spinal stenosis, lumbar region without neurogenic claudication: Secondary | ICD-10-CM | POA: Diagnosis not present

## 2019-07-30 DIAGNOSIS — G4733 Obstructive sleep apnea (adult) (pediatric): Secondary | ICD-10-CM | POA: Diagnosis not present

## 2019-08-05 ENCOUNTER — Other Ambulatory Visit: Payer: Self-pay | Admitting: Internal Medicine

## 2019-08-05 DIAGNOSIS — Z9109 Other allergy status, other than to drugs and biological substances: Secondary | ICD-10-CM

## 2019-08-05 MED ORDER — MONTELUKAST SODIUM 10 MG PO TABS: ORAL_TABLET | ORAL | 3 refills | Status: DC

## 2019-08-26 ENCOUNTER — Telehealth: Payer: Self-pay | Admitting: Family Medicine

## 2019-08-26 NOTE — Telephone Encounter (Signed)
I called pt and he relayed having issues with getting cpap fixed, calls from [facility].  He has not used for several months.  He finally spoke with them and made appt for next week wed or thurs.  I relayed that was glad to get resolved.  Hopefully he can relay his experience with the phone protocals. He appreciated call back.

## 2019-08-26 NOTE — Telephone Encounter (Signed)
Pt has called to relay that his CPAP is inoperative for months.  Pt states he has not been able to get anywhere with [facility], please call.

## 2019-08-30 DIAGNOSIS — G4733 Obstructive sleep apnea (adult) (pediatric): Secondary | ICD-10-CM | POA: Diagnosis not present

## 2019-09-03 DIAGNOSIS — G4733 Obstructive sleep apnea (adult) (pediatric): Secondary | ICD-10-CM | POA: Diagnosis not present

## 2019-09-29 DIAGNOSIS — G4733 Obstructive sleep apnea (adult) (pediatric): Secondary | ICD-10-CM | POA: Diagnosis not present

## 2019-10-02 ENCOUNTER — Other Ambulatory Visit: Payer: Self-pay | Admitting: Internal Medicine

## 2019-10-02 DIAGNOSIS — I2510 Atherosclerotic heart disease of native coronary artery without angina pectoris: Secondary | ICD-10-CM | POA: Insufficient documentation

## 2019-10-02 DIAGNOSIS — I700 Atherosclerosis of aorta: Secondary | ICD-10-CM | POA: Insufficient documentation

## 2019-10-02 DIAGNOSIS — N182 Chronic kidney disease, stage 2 (mild): Secondary | ICD-10-CM | POA: Insufficient documentation

## 2019-10-02 DIAGNOSIS — E119 Type 2 diabetes mellitus without complications: Secondary | ICD-10-CM

## 2019-10-02 DIAGNOSIS — I10 Essential (primary) hypertension: Secondary | ICD-10-CM

## 2019-10-02 NOTE — Progress Notes (Addendum)
MEDICARE ANNUAL WELLNESS VISIT AND FOLLOW UP Assessment:   [name] was seen today for follow-up and medicare wellness.  Diagnoses and all orders for this visit:  Encounter for Medicare annual wellness exam Due annually  Reminded to schedule diabetes eye exam with Dr. Joseph Art, forward report for retinopathy Had covid 19 vaccines, pending receipt of proof for documentation - he will message photo of card  Aortic atherosclerosis (HCC) Per CT 10/2018 Control blood pressure, cholesterol, glucose, increase exercise.   Coronary artery disease involving native coronary artery of native heart without angina pectoris Follows with cardiology High risk per coronary calcium score Discussed increasing statin for LDL goal <70 Possible benefit from jardiance, 38% CVD death risk reduction, samples given, check with insurance Control blood pressure, cholesterol, glucose, increase exercise.  Reminder to go to the ER if any CP, SOB, nausea, dizziness, severe HA, changes vision/speech, left arm numbness and tingling and jaw pain. -     Lipid panel  Essential hypertension -     CBC with Differential/Platelet -     COMPLETE METABOLIC PANEL WITH GFR -     Magnesium  Type 2 diabetes mellitus with stage 2 chronic kidney disease, without long-term current use of insulin [facility]) Education: Reviewed 'ABCs' of diabetes management (respective goals in parentheses):  A1C (<7), blood pressure (<130/80), and cholesterol (LDL <70) Eye Exam yearly and Dental Exam every 6 months  Dietary recommendations Physical Activity recommendations Discussed Jardiance - 2ndary CVD death reduced 38% with established T2DM and CVD, he is interested, samples 10 mg x 2 weeks and 25 mg x 2 weeks provided, will check coverage with insurance -     COMPLETE METABOLIC PANEL WITH GFR -     Hemoglobin A1c  Hyperlipidemia associated with type 2 diabetes mellitus (HCC) Continue medications- rosuvastatin  Titrate for LDL goal <70 Continue low  cholesterol diet and exercise.  Check lipid panel.  -     Lipid panel -     TSH  CKD stage 2 due to type 2 diabetes mellitus (HCC) Increase fluids, avoid NSAIDS, monitor sugars, will monitor -     COMPLETE METABOLIC PANEL WITH GFR  Morbid obesity - BMI 30+ with sleep apnea Long discussion about weight loss, diet, and exercise Recommended diet heavy in fruits and veggies and low in animal meats, cheeses, and dairy products, appropriate calorie intake Discussed appropriate weight for height and initial goal (<210lb) Follow up at next visit  Irritable bowel syndrome, unspecified type Add soluble fiber, monitor   Gastroesophageal reflux disease, unspecified whether esophagitis present Well managed on current medications Discussed diet, avoiding triggers and other lifestyle changes  Sleep apnea, unspecified type Continue CPAP  Vitamin D deficiency Continue supplement   Testosterone deficiency Not on supplement, declined injections, has added zinc, check levels  -     Testosterone  Former smoker Quit 2009, 19 pack year history; denies concerning sx Not candidate for Ct screening, obtain routine CXR  Anxiety Controlled by meds; monitor  Chronic left-sided low back pain with left-sided sciatica Continue follow up Dr. Jule Ser    Over 30 minutes of exam, counseling, chart review, and critical decision making was performed  Future Appointments  Date Time Provider Department Center  01/09/2020  8:45 AM Judd Gaudier, NP [facility] None  05/19/2020  3:00 PM Lucky Cowboy, MD [facility] None  10/05/2020  9:00 AM Judd Gaudier, NP [facility] None     Plan:   During the course of the visit the patient was educated and counseled about  appropriate screening and preventive services including:    Pneumococcal vaccine   Influenza vaccine  Prevnar 13  Td vaccine  Screening electrocardiogram  Colorectal cancer screening  Diabetes screening  Glaucoma  screening  Nutrition counseling    Wayne [name]:  Wayne [name] is a 69 y.o. male who presents for Medicare Annual Wellness Visit and 3 month follow up for HTN, hyperlipidemia, T2DM with CKD II, and vitamin D Def.   Today he reports has been having a nearly daily headache, he believes stress related, may wake up with this or will creep up on him towards the end of the day. He he reports has been left sided, gradual onset, left temple, denies aura or other prodrome, denies photosensitivity, nausea, dizziness, vision changes, or other notable sx. He describes as aching, 5/10, will resolves with Excedrin and rest. Hasn't had in the last 4 days. Denies neck pain.   He has been following with Dr. Rita Ohara for lower back pain, some degenerative disc, spinal stenosis with left radicular symptoms. Has been doing epidurals q67m which do help some. Has been taking tylenol, ibuprofen, heat as needed, pain is worse with walking/standing, resolves when he lies down.   he has a diagnosis of anxiety and is currently prescribed celexa 40 mg daily, reports symptoms are well controlled on current regimen.   He is on CPAP machine since 2020, states he is breathing better, having less headaches and has more energy when he wakes up.   BMI is Body mass index is 31.46 kg/m., he has been working on diet and exercise. Back limits walking, but does well with stationary cycle, spends 30 min about once a week.  Wt Readings from Last 3 Encounters:  10/03/19 232 lb (105.2 kg)  04/23/19 225 lb 9.6 oz (102.3 kg)  12/19/18 226 lb 12.8 oz (102.9 kg)   Former smoker, quit in 2009, 19 pack year history.  Coronary calcium score 301 in July 2020, 93rd percentile for age, then had CTA with about 50 % at LAD.  Aortic atherosclerosis per CT in June 2020.  His blood pressure has been controlled at home, today their BP is BP: 104/74 He does workout. He denies chest pain, shortness of breath, dizziness.   He is on cholesterol  medication (rosuvastatin 10 mg daily) and denies myalgias. His cholesterol is not at goal for LDL <70. The cholesterol last visit was:   Lab Results  Component Value Date   CHOL 155 04/23/2019   HDL 46 04/23/2019   LDLCALC 92 04/23/2019   TRIG 80 04/23/2019   CHOLHDL 3.4 04/23/2019   He has been working on diet and exercise for T2DM with CKD II on metformin, and denies increased appetite, nausea, paresthesia of the feet, polydipsia, polyuria and visual disturbances.  HE has glucometer but admits hasn't been checking  He is on ASA, statin, ARB.  Last A1C in the office was:  Lab Results  Component Value Date   HGBA1C 7.5 (H) 04/23/2019   He has CKD II associated with T2DM monitored at this office. Last GFR  Lab Results  Component Value Date   GFRAA 89 04/23/2019   Patient is on Vitamin D supplement.   Lab Results  Component Value Date   VD25OH 36 04/23/2019     He has a history of testosterone deficiency and is not on testosterone replacement. Is on zinc 50 mg since last visit. Denies notable sx of testosterone def. Denies symptoms of elevated estrogen or dihydrotestosterone.  Lab Results  Component Value Date   TESTOSTERONE 142 (L) 03/19/2018      Medication Review:  Current Outpatient Medications (Endocrine & Metabolic):  .  metFORMIN (GLUCOPHAGE-XR) 500 MG 24 hr tablet, TAKE TWO TABLETS BY MOUTH TWICE A DAY WITH MEALS FOR DIABETES  Current Outpatient Medications (Cardiovascular):  .  atenolol (TENORMIN) 100 MG tablet, TAKE ONE TABLET BY MOUTH DAILY FOR BLOOD PRESSURE .  losartan-hydrochlorothiazide (HYZAAR) 100-25 MG tablet, Take 1 tablet Daily for BP an Fluid Retention  / Ankle Swelling .  rosuvastatin (CRESTOR) 10 MG tablet, Take 1 tablet Daily for Cholesterol  Current Outpatient Medications (Respiratory):  .  montelukast (SINGULAIR) 10 MG tablet, Take 1 tablet Daily for Allergies  Current Outpatient Medications (Analgesics):  .  aspirin 81 MG tablet, Take 81 mg by  mouth daily.   Current Outpatient Medications (Other):  .  citalopram (CELEXA) 40 MG tablet, Take 1 tablet Daily for Mood .  Continuous Blood Gluc Sensor (FREESTYLE LIBRE 14 DAY SENSOR) MISC, USE TO CHECK BLOOD SUGARS THREE TIMES A DAY AS DIRECTED .  Magnesium 400 MG CAPS, Take 1 capsuile Daily .  pantoprazole (PROTONIX) 40 MG tablet, Take 40 mg by mouth daily. Marland Kitchen  VITAMIN D PO, Take 5,000 Units by mouth. Takes about 1 time a week. .  zinc gluconate 50 MG tablet, Take 50 mg by mouth daily. Marland Kitchen  glucose blood (ONETOUCH VERIO) test strip, Check blood sugar 1 time a day-[id number]. Marland Kitchen  Probiotic Product (PROBIOTIC DAILY PO), Take by mouth daily.  Allergies: No Known Allergies  Current Problems (verified) has HTN (hypertension); Hyperlipidemia associated with type 2 diabetes mellitus (HCC); GERD (gastroesophageal reflux disease); Anxiety; Type 2 diabetes mellitus with stage 2 chronic kidney disease, without long-term current use of insulin (HCC); IBS (irritable bowel syndrome); Vitamin D deficiency; Obesity (BMI 30.0-34.9); Testosterone deficiency; Sleep apnea; Former smoker; CKD stage 2 due to type 2 diabetes mellitus (HCC); CAD (coronary artery disease); Aortic atherosclerosis (HCC); and Chronic left-sided low back pain with left-sided sciatica on their problem list.  Screening Tests Immunization History  Administered Date(s) Administered  . Influenza Inj Mdck Quad With Preservative 01/29/2017  . Influenza Split 03/01/2015  . Influenza, High Dose Seasonal PF 03/19/2018, 04/23/2019  . Influenza,inj,quad, With Preservative 03/03/2016  . PPD Test 01/30/2014, 01/29/2017, 03/19/2018  . Pneumococcal Conjugate-13 03/19/2018  . Pneumococcal Polysaccharide-23 05/21/2008, 04/23/2019  . Td 10/30/2003  . Tdap 01/30/2014    Preventative care: Last colonoscopy: 03/2017, Dr. Christella Hartigan, never polyps, due 5 years, family history (father in 63s)  Prior vaccinations: TD or Tdap: 2015  Influenza:  04/2019  Pneumococcal: 2010, 2020 Prevnar13: 2019 Shingles/Zostavax:  Covid 19: has had 2/2, 2021, will send info  Names of Other Physician/Practitioners you currently use: 1. [facility] here for primary care 2. Dr. Lucretia Roers, eye doctor, last visit, 09/02/2018 abstracted, [facility]  - patient will schedule a follow up 3. Carmelina Noun, dentist, last visit 2021  Patient Care Team: Lucky Cowboy, MD as PCP - General (Internal Medicine) Baldo Daub, MD as Consulting Physician (Cardiology) Shirlean Kelly, MD as Consulting Physician (Neurosurgery)  Surgical: He  has a past surgical history that includes Wisdom tooth extraction; Tonsillectomy; and Colonoscopy. Family His family history includes Alzheimer's disease in his mother; Colon cancer (age of onset: 46) in his father; Dementia in his mother; Diabetes in an other family member; Hyperlipidemia in an other family member; Hypertension in his father and another family member; Stroke in an other family member. Social history  He reports that he quit smoking about 12 years ago. His smoking use included cigarettes. He started smoking about 31 years ago. He has a 19.00 pack-year smoking history. He has never used smokeless tobacco. He reports current alcohol use. He reports that he does not use drugs.  MEDICARE WELLNESS OBJECTIVES: Physical activity: Current Exercise Habits: Home exercise routine, Type of exercise: treadmill, Time (Minutes): 30, Frequency (Times/Week): 1, Weekly Exercise (Minutes/Week): 30, Intensity: Mild, Exercise limited by: orthopedic condition(s) Cardiac risk factors: Cardiac Risk Factors include: advanced age (>6men, >58 women);dyslipidemia;hypertension;male gender;diabetes mellitus;obesity (BMI >30kg/m2);sedentary lifestyle;smoking/ tobacco exposure Depression/mood screen:   Depression screen [facility] 2/9 10/03/2019  Decreased Interest 0  Down, Depressed, Hopeless 0  PHQ - 2 Score 0     ADLs:  In your present state of health, do you have any difficulty performing the following activities: 10/03/2019 04/23/2019  Hearing? N N  Vision? N N  Difficulty concentrating or making decisions? N N  Walking or climbing stairs? Y N  Comment chronic lumbar pain, manages around house -  Dressing or bathing? N N  Doing errands, shopping? N N  Some recent data might be hidden     Cognitive Testing  Alert? Yes  Normal Appearance?Yes  Oriented to person? Yes  Place? Yes   Time? Yes  Recall of three objects?  Yes  Can perform simple calculations? Yes  Displays appropriate judgment?Yes  Can read the correct time from a watch face?Yes  EOL planning: Does Patient Have a Medical Advance Directive?: No Would patient like information on creating a medical advance directive?: No - Patient declined   Objective:   Today's Vitals   10/03/19 0937  BP: 104/74  Pulse: 74  Temp: (!) 95.4 F (35.2 C)  SpO2: 97%  Weight: 232 lb (105.2 kg)  Height: 6' (1.829 m)   Body mass index is 31.46 kg/m.  General appearance: alert, no distress, WD/WN, male HEENT: normocephalic, sclerae anicteric, TMs pearly, nares patent, no discharge or erythema, pharynx normal Oral cavity: MMM, no lesions Neck: supple, no lymphadenopathy, no thyromegaly, no masses Heart: RRR, normal S1, S2, no murmurs Lungs: CTA bilaterally, no wheezes, rhonchi, or rales Abdomen: +bs, soft, non tender, non distended, no masses, no hepatomegaly, no splenomegaly Musculoskeletal: nontender, no swelling, no obvious deformity Extremities: no edema, no cyanosis, no clubbing Pulses: 2+ symmetric, upper and lower extremities, normal cap refill Neurological: alert, oriented x 3, CN2-12 intact, strength normal upper extremities and lower extremities, sensation normal throughout, DTRs 2+ throughout, no cerebellar signs, gait normal Psychiatric: normal affect, behavior normal, pleasant   Medicare Attestation I have personally  reviewed: The patient's medical and social history Their use of alcohol, tobacco or illicit drugs Their current medications and supplements The patient's functional ability including ADLs,fall risks, home safety risks, cognitive, and hearing and visual impairment Diet and physical activities Evidence for depression or mood disorders  The patient's weight, height, BMI, and visual acuity have been recorded in the chart.  I have made referrals, counseling, and provided education to the patient based on review of the above and I have provided the patient with a written personalized care plan for preventive services.     Dan Maker, NP   10/03/2019

## 2019-10-03 ENCOUNTER — Ambulatory Visit (INDEPENDENT_AMBULATORY_CARE_PROVIDER_SITE_OTHER): Payer: PPO | Admitting: Adult Health

## 2019-10-03 ENCOUNTER — Other Ambulatory Visit: Payer: Self-pay

## 2019-10-03 ENCOUNTER — Encounter: Payer: Self-pay | Admitting: Adult Health

## 2019-10-03 VITALS — BP 104/74 | HR 74 | Temp 95.4°F | Ht 72.0 in | Wt 232.0 lb

## 2019-10-03 DIAGNOSIS — K589 Irritable bowel syndrome without diarrhea: Secondary | ICD-10-CM

## 2019-10-03 DIAGNOSIS — M5442 Lumbago with sciatica, left side: Secondary | ICD-10-CM

## 2019-10-03 DIAGNOSIS — I2510 Atherosclerotic heart disease of native coronary artery without angina pectoris: Secondary | ICD-10-CM

## 2019-10-03 DIAGNOSIS — E785 Hyperlipidemia, unspecified: Secondary | ICD-10-CM | POA: Diagnosis not present

## 2019-10-03 DIAGNOSIS — E1122 Type 2 diabetes mellitus with diabetic chronic kidney disease: Secondary | ICD-10-CM

## 2019-10-03 DIAGNOSIS — F419 Anxiety disorder, unspecified: Secondary | ICD-10-CM

## 2019-10-03 DIAGNOSIS — R6889 Other general symptoms and signs: Secondary | ICD-10-CM

## 2019-10-03 DIAGNOSIS — E1169 Type 2 diabetes mellitus with other specified complication: Secondary | ICD-10-CM

## 2019-10-03 DIAGNOSIS — E349 Endocrine disorder, unspecified: Secondary | ICD-10-CM

## 2019-10-03 DIAGNOSIS — E559 Vitamin D deficiency, unspecified: Secondary | ICD-10-CM | POA: Diagnosis not present

## 2019-10-03 DIAGNOSIS — Z0001 Encounter for general adult medical examination with abnormal findings: Secondary | ICD-10-CM

## 2019-10-03 DIAGNOSIS — I10 Essential (primary) hypertension: Secondary | ICD-10-CM | POA: Diagnosis not present

## 2019-10-03 DIAGNOSIS — K219 Gastro-esophageal reflux disease without esophagitis: Secondary | ICD-10-CM | POA: Diagnosis not present

## 2019-10-03 DIAGNOSIS — N182 Chronic kidney disease, stage 2 (mild): Secondary | ICD-10-CM | POA: Diagnosis not present

## 2019-10-03 DIAGNOSIS — G4730 Sleep apnea, unspecified: Secondary | ICD-10-CM

## 2019-10-03 DIAGNOSIS — G8929 Other chronic pain: Secondary | ICD-10-CM

## 2019-10-03 DIAGNOSIS — Z87891 Personal history of nicotine dependence: Secondary | ICD-10-CM

## 2019-10-03 DIAGNOSIS — Z0000 Encounter for general adult medical examination without abnormal findings: Secondary | ICD-10-CM

## 2019-10-03 DIAGNOSIS — I700 Atherosclerosis of aorta: Secondary | ICD-10-CM | POA: Diagnosis not present

## 2019-10-03 NOTE — Patient Instructions (Addendum)
Wayne [name] , Thank you for taking time to come for your Medicare Wellness Visit. I appreciate your ongoing commitment to your health goals. Please review the following plan we discussed and let me know if I can assist you in the future.   These are the goals we discussed: Goals    . Exercise 150 min/wk Moderate Activity    . HEMOGLOBIN A1C < 7    . LDL CALC < 70    . Weight (lb) < 210 lb (95.3 kg)       This is a list of the screening recommended for you and due dates:  Health Maintenance  Topic Date Due  . COVID-19 Vaccine (1) Never done  . Eye exam for diabetics  09/02/2019  . Hemoglobin A1C  10/22/2019  . Flu Shot  12/14/2019  . Complete foot exam   04/22/2020  . Colon Cancer Screening  03/30/2022  . Tetanus Vaccine  01/31/2024  .  Hepatitis C: One time screening is recommended by Center for Disease Control  (CDC) for  adults born from [birth year] through 1963.   Completed  . Pneumonia vaccines  Completed     Please reach out to insurance about shingrix vaccine coverage - if they cover, can get at a [facility]   Please schedule Dr. Lucretia Roers diabetes eye   Discussed Jardiance - 2ndary CVD death reduced 38% with established T2DM and CVD     Empagliflozin oral tablets What is this medicine? EMPAGLIFLOZIN (EM pa gli FLOE zin) helps to treat type 2 diabetes. It helps to control blood sugar. This drug may also reduce the risk of heart attack or stroke if you have type 2 diabetes and risk factors for heart disease. Treatment is combined with diet and exercise. This medicine may be used for other purposes; ask your health care provider or pharmacist if you have questions. COMMON BRAND NAME(S): Jardiance What should I tell my health care provider before I take this medicine? They need to know if you have any of these conditions:  dehydration  diabetic ketoacidosis  diet low in salt  eating less due to illness, surgery, dieting, or any other reason  having surgery  high  cholesterol  high levels of potassium in the blood  history of pancreatitis or pancreas problems  history of yeast infection of the penis or vagina  if you often drink alcohol  infections in the bladder, kidneys, or urinary tract  kidney disease  liver disease  low blood pressure  on hemodialysis  problems urinating  type 1 diabetes  uncircumcised male  an unusual or allergic reaction to empagliflozin, other medicines, foods, dyes, or preservatives  pregnant or trying to get pregnant  breast-feeding How should I use this medicine? Take this medicine by mouth with a glass of water. Follow the directions on the prescription label. Take it in the morning, with or without food. Take your dose at the same time each day. Do not take more often than directed. Do not stop taking except on your doctor's advice. Talk to your pediatrician regarding the use of this medicine in children. Special care may be needed. Overdosage: If you think you have taken too much of this medicine contact a poison control center or emergency room at once. NOTE: This medicine is only for you. Do not share this medicine with others. What if I miss a dose? If you miss a dose, take it as soon as you can. If it is almost time for your  next dose, take only that dose. Do not take double or extra doses. What may interact with this medicine? Do not take this medicine with any of the following medications:  gatifloxacin This medicine may also interact with the following medications:  alcohol  certain medicines for blood pressure, heart disease  diuretics This list may not describe all possible interactions. Give your health care provider a list of all the medicines, herbs, non-prescription drugs, or dietary supplements you use. Also tell them if you smoke, drink alcohol, or use illegal drugs. Some items may interact with your medicine. What should I watch for while using this medicine? Visit your doctor or  health care professional for regular checks on your progress. This medicine can cause a serious condition in which there is too much acid in the blood. If you develop nausea, vomiting, stomach pain, unusual tiredness, or breathing problems, stop taking this medicine and call your doctor right away. If possible, use a ketone dipstick to check for ketones in your urine. A test called the HbA1C (A1C) will be monitored. This is a simple blood test. It measures your blood sugar control over the last 2 to 3 months. You will receive this test every 3 to 6 months. Learn how to check your blood sugar. Learn the symptoms of low and high blood sugar and how to manage them. Always carry a quick-source of sugar with you in case you have symptoms of low blood sugar. Examples include hard sugar candy or glucose tablets. Make sure others know that you can choke if you eat or drink when you develop serious symptoms of low blood sugar, such as seizures or unconsciousness. They must get medical help at once. Tell your doctor or health care professional if you have high blood sugar. You might need to change the dose of your medicine. If you are sick or exercising more than usual, you might need to change the dose of your medicine. Do not skip meals. Ask your doctor or health care professional if you should avoid alcohol. Many nonprescription cough and cold products contain sugar or alcohol. These can affect blood sugar. Wear a medical ID bracelet or chain, and carry a card that describes your disease and details of your medicine and dosage times. What side effects may I notice from receiving this medicine? Side effects that you should report to your doctor or health care professional as soon as possible:  allergic reactions like skin rash, itching or hives, swelling of the face, lips, or tongue  breathing problems  dizziness  feeling faint or lightheaded, falls  muscle weakness  nausea, vomiting, unusual stomach  upset or pain  penile discharge, itching, or pain in men  signs and symptoms of a genital infection, such as fever; tenderness, redness, or swelling in the genitals or area from the genitals to the back of the rectum  signs and symptoms of low blood sugar such as feeling anxious, confusion, dizziness, increased hunger, unusually weak or tired, sweating, shakiness, cold, irritable, headache, blurred vision, fast heartbeat, loss of consciousness  signs and symptoms of a urinary tract infection, such as fever, chills, a burning feeling when urinating, blood in the urine, back pain  trouble passing urine or change in the amount of urine, including an urgent need to urinate more often, in larger amounts, or at night  unusual tiredness  vaginal discharge, itching, or odor in women Side effects that usually do not require medical attention (report to your doctor or health care professional  if they continue or are bothersome):  mild increase in urination  thirsty This list may not describe all possible side effects. Call your doctor for medical advice about side effects. You may report side effects to FDA at 1-800-FDA-1088. Where should I keep my medicine? Keep out of the reach of children. Store at room temperature between 20 and 25 degrees C (68 and 77 degrees F). Throw away any unused medicine after the expiration date. NOTE: This sheet is a summary. It may not cover all possible information. If you have questions about this medicine, talk to your doctor, pharmacist, or health care provider.  2020 Elsevier/Gold Standard (2017-01-11 10:25:34)

## 2019-10-04 LAB — CBC WITH DIFFERENTIAL/PLATELET
Absolute Monocytes: 331 cells/uL (ref 200–950)
Basophils Absolute: 52 cells/uL (ref 0–200)
Basophils Relative: 0.9 %
Eosinophils Absolute: 151 cells/uL (ref 15–500)
Eosinophils Relative: 2.6 %
HCT: 37.9 % — ABNORMAL LOW (ref 38.5–50.0)
Hemoglobin: 12.7 g/dL — ABNORMAL LOW (ref 13.2–17.1)
Lymphs Abs: 3109 cells/uL (ref 850–3900)
MCH: 30.9 pg (ref 27.0–33.0)
MCHC: 33.5 g/dL (ref 32.0–36.0)
MCV: 92.2 fL (ref 80.0–100.0)
MPV: 10.7 fL (ref 7.5–12.5)
Monocytes Relative: 5.7 %
Neutro Abs: 2158 cells/uL (ref 1500–7800)
Neutrophils Relative %: 37.2 %
Platelets: 257 10*3/uL (ref 140–400)
RBC: 4.11 10*6/uL — ABNORMAL LOW (ref 4.20–5.80)
RDW: 12.3 % (ref 11.0–15.0)
Total Lymphocyte: 53.6 %
WBC: 5.8 10*3/uL (ref 3.8–10.8)

## 2019-10-04 LAB — HEMOGLOBIN A1C
Hgb A1c MFr Bld: 7.8 % of total Hgb — ABNORMAL HIGH (ref <5.7)
Mean Plasma Glucose: 177 (calc)
eAG (mmol/L): 9.8 (calc)

## 2019-10-04 LAB — COMPLETE METABOLIC PANEL WITH GFR
AG Ratio: 1.4 (calc) (ref 1.0–2.5)
ALT: 16 U/L (ref 9–46)
AST: 17 U/L (ref 10–35)
Albumin: 4 g/dL (ref 3.6–5.1)
Alkaline phosphatase (APISO): 53 U/L (ref 35–144)
BUN: 16 mg/dL (ref 7–25)
CO2: 32 mmol/L (ref 20–32)
Calcium: 10.2 mg/dL (ref 8.6–10.3)
Chloride: 102 mmol/L (ref 98–110)
Creat: 0.93 mg/dL (ref 0.70–1.25)
GFR, Est African American: 98 mL/min/{1.73_m2} (ref >=60)
GFR, Est Non African American: 85 mL/min/{1.73_m2} (ref >=60)
Globulin: 2.9 g/dL (calc) (ref 1.9–3.7)
Glucose, Bld: 160 mg/dL — ABNORMAL HIGH (ref 65–99)
Potassium: 4.4 mmol/L (ref 3.5–5.3)
Sodium: 138 mmol/L (ref 135–146)
Total Bilirubin: 0.5 mg/dL (ref 0.2–1.2)
Total Protein: 6.9 g/dL (ref 6.1–8.1)

## 2019-10-04 LAB — TSH: TSH: 1.87 mIU/L (ref 0.40–4.50)

## 2019-10-04 LAB — LIPID PANEL
Cholesterol: 119 mg/dL (ref <200)
HDL: 39 mg/dL — ABNORMAL LOW (ref >=40)
LDL Cholesterol (Calc): 64 mg/dL (calc)
Non-HDL Cholesterol (Calc): 80 mg/dL (calc) (ref <130)
Total CHOL/HDL Ratio: 3.1 (calc) (ref <5.0)
Triglycerides: 77 mg/dL (ref <150)

## 2019-10-04 LAB — MAGNESIUM: Magnesium: 1.6 mg/dL (ref 1.5–2.5)

## 2019-10-04 LAB — TESTOSTERONE: Testosterone: 294 ng/dL (ref 250–827)

## 2019-10-10 DIAGNOSIS — M5416 Radiculopathy, lumbar region: Secondary | ICD-10-CM | POA: Diagnosis not present

## 2019-10-30 DIAGNOSIS — G4733 Obstructive sleep apnea (adult) (pediatric): Secondary | ICD-10-CM | POA: Diagnosis not present

## 2019-11-04 ENCOUNTER — Other Ambulatory Visit: Payer: Self-pay | Admitting: Internal Medicine

## 2019-11-04 DIAGNOSIS — E782 Mixed hyperlipidemia: Secondary | ICD-10-CM

## 2019-11-04 DIAGNOSIS — I10 Essential (primary) hypertension: Secondary | ICD-10-CM

## 2019-11-11 ENCOUNTER — Other Ambulatory Visit: Payer: Self-pay | Admitting: Internal Medicine

## 2019-11-14 ENCOUNTER — Other Ambulatory Visit: Payer: Self-pay

## 2019-11-14 ENCOUNTER — Ambulatory Visit (HOSPITAL_COMMUNITY)
Admission: EM | Admit: 2019-11-14 | Discharge: 2019-11-14 | Disposition: A | Discharge status: Home / Self Care | Payer: PPO | Attending: Urgent Care | Admitting: Urgent Care

## 2019-11-14 ENCOUNTER — Telehealth (HOSPITAL_COMMUNITY): Payer: Self-pay

## 2019-11-14 ENCOUNTER — Encounter (HOSPITAL_COMMUNITY): Payer: Self-pay

## 2019-11-14 DIAGNOSIS — J340 Abscess, furuncle and carbuncle of nose: Secondary | ICD-10-CM

## 2019-11-14 DIAGNOSIS — J3489 Other specified disorders of nose and nasal sinuses: Secondary | ICD-10-CM

## 2019-11-14 DIAGNOSIS — I2510 Atherosclerotic heart disease of native coronary artery without angina pectoris: Secondary | ICD-10-CM

## 2019-11-14 DIAGNOSIS — E119 Type 2 diabetes mellitus without complications: Secondary | ICD-10-CM

## 2019-11-14 MED ORDER — AMOXICILLIN-POT CLAVULANATE 875-125 MG PO TABS: 1.0000 | ORAL_TABLET | Freq: Two times a day (BID) | ORAL | 0 refills | Status: DC

## 2019-11-14 MED ORDER — HYDROCODONE-ACETAMINOPHEN 5-325 MG PO TABS: 1.0000 | ORAL_TABLET | Freq: Four times a day (QID) | ORAL | 0 refills | Status: DC | PRN

## 2019-11-14 NOTE — ED Provider Notes (Signed)
[facility]   MRN: [id number] DOB: [birth year]  Subjective:   Wayne [name] is a 69 y.o. male presenting for 1 week history of persistent and worsening right-sided sinus/nasal pain, sinus headaches.  Patient states that he is now having white spots show up on the top of his nose and continues to have drainage from within his nose.  Has a history of diabetes, is well controlled last A1c in May was less than 8%.  He has a history of heart disease, aortic atherosclerosis.  Has been using Tylenol and Excedrin only.  Denies weakness, confusion, vision change, ear pain, throat pain, numbness or tingling.  No current facility-administered medications for this encounter.  Current Outpatient Medications:    aspirin 81 MG tablet, Take 81 mg by mouth daily., Disp: , Rfl:    atenolol (TENORMIN) 100 MG tablet, TAKE ONE TABLET BY MOUTH DAILY FOR BLOOD PRESSURE, Disp: 90 tablet, Rfl: 0   citalopram (CELEXA) 40 MG tablet, Take 1 tablet Daily for Mood, Disp: 90 tablet, Rfl: 1   Continuous Blood Gluc Sensor (FREESTYLE LIBRE 14 DAY SENSOR) MISC, USE TO CHECK BLOOD SUGARS THREE TIMES A DAY AS DIRECTED, Disp: 3 each, Rfl: 3   glucose blood (ONETOUCH VERIO) test strip, Check blood sugar 1 time a day-DX-E11.22., Disp: 100 each, Rfl: 4   losartan-hydrochlorothiazide (HYZAAR) 100-25 MG tablet, TAKE ONE TABLET BY MOUTH DAILY FOR BLOOD PRESSURE AND FLUID RETENTION, Disp: 90 tablet, Rfl: 0   Magnesium 400 MG CAPS, Take 1 capsuile Daily, Disp: , Rfl:    metFORMIN (GLUCOPHAGE-XR) 500 MG 24 hr tablet, TAKE TWO TABLETS BY MOUTH TWICE A DAY WITH MEALS FOR DIABETES, Disp: 360 tablet, Rfl: 0   montelukast (SINGULAIR) 10 MG tablet, Take 1 tablet Daily for Allergies, Disp: 90 tablet, Rfl: 3   pantoprazole (PROTONIX) 40 MG tablet, Take 40 mg by mouth daily., Disp: , Rfl:    Probiotic Product (PROBIOTIC DAILY PO), Take by mouth daily., Disp: , Rfl:    rosuvastatin (CRESTOR) 10 MG tablet, TAKE ONE TABLET BY  MOUTH DAILY FOR CHOLESTEROL, Disp: 90 tablet, Rfl: 0   VITAMIN D PO, Take 5,000 Units by mouth. Takes about 1 time a week., Disp: , Rfl:    zinc gluconate 50 MG tablet, Take 50 mg by mouth daily., Disp: , Rfl:    No Known Allergies  Past Medical History:  Diagnosis Date   Allergy    Anxiety    On Xanax   Chest pain    Fatigue    GERD (gastroesophageal reflux disease)    Heart murmur    hx of MVP    HTN (hypertension)    Hyperlipemia    IBS (irritable bowel syndrome)    Medication management 06/13/2013   Mitral valve prolapse    Obesity (BMI 30.0-34.9) 08/20/2015   OSA (obstructive sleep apnea)    Restated CPAP   Sleep apnea    no cpap    T2_NIDDM    Testosterone deficiency 08/20/2015   Type II or unspecified type diabetes mellitus without mention of complication, not stated as uncontrolled    Vitamin D deficiency      Past Surgical History:  Procedure Laterality Date   COLONOSCOPY     TONSILLECTOMY     WISDOM TOOTH EXTRACTION      Family History  Problem Relation Age of Onset   Stroke Other    Hypertension Other    Hyperlipidemia Other    Diabetes Other    Colon cancer Father  64   Hypertension Father    Dementia Mother    Alzheimer's disease Mother    Colon polyps Neg Hx    Esophageal cancer Neg Hx    Rectal cancer Neg Hx    Stomach cancer Neg Hx     Social History   Tobacco Use   Smoking status: Former Smoker    Packs/day: 1.00    Years: 19.00    Pack years: 19.00    Types: Cigarettes    Start date: 9    Quit date: 09/06/2007    Years since quitting: 12.1   Smokeless tobacco: Never Used  Vaping Use   Vaping Use: Never used  Substance Use Topics   Alcohol use: Yes    Comment: occasional   Drug use: No    ROS   Objective:   Vitals: BP 118/78    Pulse 68    Temp (!) 97.4 F (36.3 C) (Oral)    Resp 16    Ht 6' (1.829 m)    Wt 228 lb (103.4 kg)    SpO2 96%    BMI 30.92 kg/m   Physical  Exam Constitutional:      General: He is not in acute distress.    Appearance: Normal appearance. He is well-developed and normal weight. He is not ill-appearing, toxic-appearing or diaphoretic.  HENT:     Head: Normocephalic and atraumatic.     Right Ear: External ear normal.     Left Ear: External ear normal.     Nose: Nose normal. No congestion or rhinorrhea.      Mouth/Throat:     Mouth: Mucous membranes are moist.     Pharynx: Oropharynx is clear. No oropharyngeal exudate or posterior oropharyngeal erythema.  Eyes:     General: No scleral icterus.       Right eye: No discharge.        Left eye: No discharge.     Extraocular Movements: Extraocular movements intact.     Conjunctiva/sclera: Conjunctivae normal.     Pupils: Pupils are equal, round, and reactive to light.  Cardiovascular:     Rate and Rhythm: Normal rate.  Pulmonary:     Effort: Pulmonary effort is normal.  Musculoskeletal:     Cervical back: Normal range of motion and neck supple. No rigidity. No muscular tenderness.  Skin:    General: Skin is warm and dry.  Neurological:     General: No focal deficit present.     Mental Status: He is alert and oriented to person, place, and time.     Cranial Nerves: No cranial nerve deficit.     Motor: No weakness.     Coordination: Coordination normal.     Gait: Gait normal.     Deep Tendon Reflexes: Reflexes normal.  Psychiatric:        Mood and Affect: Mood normal.        Behavior: Behavior normal.        Thought Content: Thought content normal.        Judgment: Judgment normal.      Assessment and Plan :   PDMP not reviewed this encounter.  1. Furuncle of nasal cavity   2. Nasal pain   3. Well controlled diabetes mellitus (HCC)   4. Coronary artery disease involving native heart without angina pectoris, unspecified vessel or lesion type     Start Augmentin to address infectious process.  Recommended patient schedule Tylenol, use hydrocodone for severe  breakthrough pain.  Emphasized  need for close follow-up, strict ER precautions. Counseled patient on potential for adverse effects with medications prescribed today, patient verbalized understanding.   Wallis Bamberg, [place] 11/14/19 1123

## 2019-11-14 NOTE — ED Triage Notes (Signed)
Pt c/o cyst in the right nostril on the roof of nose. Pt states it's been draining yellow drainagex1 wk. Pt has a few little red raised bumps on outside of nose started yesterday.

## 2019-11-14 NOTE — Discharge Instructions (Addendum)
Please just use Tylenol at a dose of 500mg -650mg  once every 6 hours as needed for your aches, pains, fevers. Do not use any nonsteroidal anti-inflammatories (NSAIDs) like ibuprofen, Motrin, naproxen, Aleve, etc. which are all available over-the-counter.  It is okay to use hydrocodone for severe pain, pain that is not controlled by using Tylenol or Excedrin.  Use Augmentin for infection. If you develop worsening headache, confusion, vision changes, dizziness, weakness, head to the ER for a head CT scan.

## 2019-11-15 NOTE — Progress Notes (Signed)
Assessment and Plan:  Kisean was seen today for follow-up.  Diagnoses and all orders for this visit:  Furuncle of nose -     Discontinue: Augmentin Rx-     doxycycline (VIBRA-TABS) 100 MG tablet; Take 1 tablet (100 mg total) by mouth 2 (two) times daily for 10 days.  Bronchitis Take mucinex DM Q12 hours while having symptoms Increase water intake  Acute non-recurrent pansinusitis OTC Zyrtec-D, monitor blood pressure while taking, D/C with elevated readings. -     doxycycline (VIBRA-TABS) 100 MG tablet; Take 1 tablet (100 mg total) by mouth 2 (two) times daily for 10 days.  Essential hypertension Continue current medications: Monitor blood pressure at home; call if consistently over 130/80 Continue DASH diet.   Reminder to go to the ER if any CP, SOB, nausea, dizziness, severe HA, changes vision/speech, left arm numbness and tingling and jaw pain. Monitor blood pressure while taking antihistamine with decongestant. Diarrhea, unspecified type D/C Augmentin Consider OTC probiotic as directed OR yogurt with live active cultures  Medication management Continued   Further disposition pending results of labs. Discussed med's effects and SE's.   Over 30 minutes of face to face exam, counseling, chart review, and critical decision making was performed.   Future Appointments  Date Time Provider Department Center  01/09/2020  8:45 AM Judd Gaudier, NP [facility] None  05/19/2020  3:00 PM Lucky Cowboy, MD [facility] None  10/05/2020  9:00 AM Judd Gaudier, NP [facility] None    ------------------------------------------------------------------------------------------------------------------   HPI 69 y.o.male presents for evaluation after ED visit for nasal pain and headaches.  He reports it started over one week ago.  He had white spots on the top of his nose and drainage inside his nose.  He was given augmentin BID.  Reports since starting this he has had diarrhea and not  tolerating the medication well related to this. He was also given hydrocodone for pain Q6 PRN.  He reports today he is still having headaches with this that have not improved.  He has a cough as well as sinus tenderness.  Reports the white spots on the outside of his right nare have improved.  He was given hospital precautions.  Reports his symptoms overall have been going on for two weeks or more. He continues to have a cough that is intermittently productive.  He reports that it is worse when he lays down at night.  He has been taking Singulair to help with nasal drainage.  Reports that he is not sure if this has been helping.  He does not take an antihistamine regularly. Discussed discontinuing current antibiotic treatment related to diarrhea.     Past Medical History:  Diagnosis Date  . Allergy   . Anxiety    On Xanax  . Chest pain   . Fatigue   . GERD (gastroesophageal reflux disease)   . Heart murmur    hx of MVP   . HTN (hypertension)   . Hyperlipemia   . IBS (irritable bowel syndrome)   . Medication management 06/13/2013  . Mitral valve prolapse   . Obesity (BMI 30.0-34.9) 08/20/2015  . OSA (obstructive sleep apnea)    Restated CPAP  . Sleep apnea    no cpap   . T2_NIDDM   . Testosterone deficiency 08/20/2015  . Type II or unspecified type diabetes mellitus without mention of complication, not stated as uncontrolled   . Vitamin D deficiency      No Known Allergies  Current Outpatient Medications on  File Prior to Visit  Medication Sig  . aspirin 81 MG tablet Take 81 mg by mouth daily.  Marland Kitchen atenolol (TENORMIN) 100 MG tablet TAKE ONE TABLET BY MOUTH DAILY FOR BLOOD PRESSURE  . citalopram (CELEXA) 40 MG tablet Take 1 tablet Daily for Mood  . Continuous Blood Gluc Sensor (FREESTYLE LIBRE 14 DAY SENSOR) MISC USE TO CHECK BLOOD SUGARS THREE TIMES A DAY AS DIRECTED  . glucose blood (ONETOUCH VERIO) test strip Check blood sugar 1 time a day-DX-E11.22.  . HYDROcodone-acetaminophen  (NORCO/VICODIN) 5-325 MG tablet Take 1 tablet by mouth every 6 (six) hours as needed for severe pain.  Marland Kitchen losartan-hydrochlorothiazide (HYZAAR) 100-25 MG tablet TAKE ONE TABLET BY MOUTH DAILY FOR BLOOD PRESSURE AND FLUID RETENTION  . Magnesium 400 MG CAPS Take 1 capsuile Daily  . metFORMIN (GLUCOPHAGE-XR) 500 MG 24 hr tablet TAKE TWO TABLETS BY MOUTH TWICE A DAY WITH MEALS FOR DIABETES  . montelukast (SINGULAIR) 10 MG tablet Take 1 tablet Daily for Allergies  . pantoprazole (PROTONIX) 40 MG tablet Take 40 mg by mouth daily.  . Probiotic Product (PROBIOTIC DAILY PO) Take by mouth daily.  . rosuvastatin (CRESTOR) 10 MG tablet TAKE ONE TABLET BY MOUTH DAILY FOR CHOLESTEROL  . VITAMIN D PO Take 5,000 Units by mouth. Takes about 1 time a week.  . zinc gluconate 50 MG tablet Take 50 mg by mouth daily.   No current facility-administered medications on file prior to visit.    ROS: all negative except above.   Physical Exam:  BP 132/74   Wt 233 lb (105.7 kg)   BMI 31.60 kg/m   General Appearance: Well nourished, in no apparent distress. Eyes: PERRLA, EOMs, conjunctiva no swelling or erythema Sinuses: Frontal/maxillary tenderness noted, right nare, mild erythema noted. ENT/Mouth: Ext aud canals clear, TMs without erythema, bulging. No erythema, swelling, or exudate on post pharynx.  Tonsils not swollen or erythematous. Hearing normal.  Neck: Supple, thyroid normal.  Respiratory: Respiratory effort normal, BS equal bilaterally without rales, rhonchi, wheezing or stridor.  Cardio: RRR with no MRGs. Brisk peripheral pulses without edema.  Abdomen: Soft, + BS.  Non tender, no guarding, rebound, hernias, masses. Lymphatics: Non tender without lymphadenopathy.  Musculoskeletal: Full ROM, 5/5 strength, normal gait.  Skin: Warm, dry without rashes, lesions, ecchymosis.  Neuro: Cranial nerves intact. Normal muscle tone, no cerebellar symptoms. Sensation intact.  Psych: Awake and oriented X 3, normal  affect, Insight and Judgment appropriate.     Elder Negus, NP 1:59 PM [facility]

## 2019-11-18 ENCOUNTER — Ambulatory Visit (INDEPENDENT_AMBULATORY_CARE_PROVIDER_SITE_OTHER): Payer: PPO | Admitting: Adult Health Nurse Practitioner

## 2019-11-18 ENCOUNTER — Encounter: Payer: Self-pay | Admitting: Adult Health Nurse Practitioner

## 2019-11-18 ENCOUNTER — Other Ambulatory Visit: Payer: Self-pay

## 2019-11-18 VITALS — BP 132/74 | Wt 233.0 lb

## 2019-11-18 DIAGNOSIS — J40 Bronchitis, not specified as acute or chronic: Secondary | ICD-10-CM | POA: Diagnosis not present

## 2019-11-18 DIAGNOSIS — R197 Diarrhea, unspecified: Secondary | ICD-10-CM | POA: Diagnosis not present

## 2019-11-18 DIAGNOSIS — I10 Essential (primary) hypertension: Secondary | ICD-10-CM

## 2019-11-18 DIAGNOSIS — Z79899 Other long term (current) drug therapy: Secondary | ICD-10-CM | POA: Diagnosis not present

## 2019-11-18 DIAGNOSIS — J0140 Acute pansinusitis, unspecified: Secondary | ICD-10-CM

## 2019-11-18 DIAGNOSIS — J340 Abscess, furuncle and carbuncle of nose: Secondary | ICD-10-CM | POA: Diagnosis not present

## 2019-11-18 MED ORDER — DOXYCYCLINE HYCLATE 100 MG PO TABS: 100.0000 mg | ORAL_TABLET | Freq: Two times a day (BID) | ORAL | 0 refills | Status: DC

## 2019-11-18 MED ORDER — DOXYCYCLINE HYCLATE 100 MG PO TABS: 100.0000 mg | ORAL_TABLET | Freq: Two times a day (BID) | ORAL | 0 refills | Status: AC

## 2019-11-18 NOTE — Patient Instructions (Addendum)
   Take Mucinex DM every 12 hours while having symptoms.  Try Zyrtec (Cetirazine) -D as directed.  This will help to dry up sinuses as well as decongestant.  Check your blood pressure twice a day.  Check one hour after taking medication.  IF your blood pressure is elevated STOP taking the nasal decongestant and take plain Zyrtec (cetirazine).  Take this medication.  STOP taking AUGMENTIN!   Start Doxycycline.   Increase probiotic, either over the counter supplement OR yogurt that has active cultures.  This will help to increase the good bacteria in your gut decreasing diarrhea.

## 2019-11-21 ENCOUNTER — Ambulatory Visit (INDEPENDENT_AMBULATORY_CARE_PROVIDER_SITE_OTHER): Payer: PPO | Admitting: Adult Health

## 2019-11-21 ENCOUNTER — Other Ambulatory Visit: Payer: Self-pay | Admitting: Adult Health

## 2019-11-21 ENCOUNTER — Encounter: Payer: Self-pay | Admitting: Adult Health

## 2019-11-21 ENCOUNTER — Other Ambulatory Visit: Payer: Self-pay

## 2019-11-21 VITALS — BP 110/70 | HR 81 | Temp 95.9°F | Ht 72.0 in | Wt 231.0 lb

## 2019-11-21 DIAGNOSIS — R197 Diarrhea, unspecified: Secondary | ICD-10-CM | POA: Diagnosis not present

## 2019-11-21 DIAGNOSIS — K521 Toxic gastroenteritis and colitis: Secondary | ICD-10-CM | POA: Diagnosis not present

## 2019-11-21 NOTE — Patient Instructions (Addendum)
"Align"  Probiotic daily, yogurt, fermented foods - otherwise stick to bland foods  Soluble fiber supplement - citrucel/benefiber   Try witch hazel, preparation H , etc for anal itching   Add Gatorade or Pedialyte alternating with water while having liquid stools  My main concern is ruling out c. Diff diarrhea - DO NOT TAKE IMODIUM AS THIS MAY CAUSE TOXIC BUILDUP   Stool culture should return in 2-5 days after you drop off the specimen  If any new concerns please let me know via mychart or call the office     Diarrhea, Adult Diarrhea is when you pass loose and watery poop (stool) often. Diarrhea can make you feel weak and cause you to lose water in your body (get dehydrated). Losing water in your body can cause you to:  Feel tired and thirsty.  Have a dry mouth.  Go pee (urinate) less often. Diarrhea often lasts 2-3 days. However, it can last longer if it is a sign of something more serious. It is important to treat your diarrhea as told by your doctor. Follow these instructions at home: Eating and drinking     Follow these instructions as told by your doctor:  Take an ORS (oral rehydration solution). This is a drink that helps you replace fluids and minerals your body lost. It is sold at pharmacies and stores.  Drink plenty of fluids, such as: ? Water. ? Ice chips. ? Diluted fruit juice. ? Low-calorie sports drinks. ? Milk, if you want.  Avoid drinking fluids that have a lot of sugar or caffeine in them.  Eat bland, easy-to-digest foods in small amounts as you are able. These foods include: ? Bananas. ? Applesauce. ? Rice. ? Low-fat (lean) meats. ? Toast. ? Crackers.  Avoid alcohol.  Avoid spicy or fatty foods.  Medicines  Take over-the-counter and prescription medicines only as told by your doctor.  If you were prescribed an antibiotic medicine, take it as told by your doctor. Do not stop using the antibiotic even if you start to feel better. General  instructions   Wash your hands often using soap and water. If soap and water are not available, use a hand sanitizer. Others in your home should wash their hands as well. Hands should be washed: ? After using the toilet or changing a diaper. ? Before preparing, cooking, or serving food. ? While caring for a sick person. ? While visiting someone in a hospital.  Drink enough fluid to keep your pee (urine) pale yellow.  Rest at home while you get better.  Watch your condition for any changes.  Take a warm bath to help with any burning or pain from having diarrhea.  Keep all follow-up visits as told by your doctor. This is important. Contact a doctor if:  You have a fever.  Your diarrhea gets worse.  You have new symptoms.  You cannot keep fluids down.  You feel light-headed or dizzy.  You have a headache.  You have muscle cramps. Get help right away if:  You have chest pain.  You feel very weak or you pass out (faint).  You have bloody or black poop or poop that looks like tar.  You have very bad pain, cramping, or bloating in your belly (abdomen).  You have trouble breathing or you are breathing very quickly.  Your heart is beating very quickly.  Your skin feels cold and clammy.  You feel confused.  You have signs of losing too much water in  your body, such as: ? Dark pee, very little pee, or no pee. ? Cracked lips. ? Dry mouth. ? Sunken eyes. ? Sleepiness. ? Weakness. Summary  Diarrhea is when you pass loose and watery poop (stool) often.  Diarrhea can make you feel weak and cause you to lose water in your body (get dehydrated).  Take an ORS (oral rehydration solution). This is a drink that is sold at pharmacies and stores.  Eat bland, easy-to-digest foods in small amounts as you are able.  Contact a doctor if your condition gets worse. Get help right away if you have signs that you have lost too much water in your body. This information is not  intended to replace advice given to you by your health care provider. Make sure you discuss any questions you have with your health care provider. Document Revised: 10/05/2017 Document Reviewed: 10/05/2017 Elsevier Patient Education  2020 [employer].   Clostridioides Difficile Infection Clostridioides difficile infection, also known as C. difficile or C. diff infection, happens when too much C. diff bacteria grows and causes inflammation of the colon (colitis). It is linked to recent use of antibiotic medicine. This infection can be passed from person to person (is contagious). You may also be exposed to the bacteria in the environment, such as from contact with food, water, or surfaces that have the bacteria on them. What are the causes? Certain bacteria live in the colon and help to digest food. This infection develops when the balance of helpful bacteria in the colon changes and the C. diff bacteria grow out of control. This is caused by taking antibiotics. What increases the risk? The following factors may make you more likely to develop this condition:  Taking certain antibiotics that kill many types of bacteria or taking antibiotics for a long time.  Having an extended stay in health care settings, such as hospitals and long-term care facilities.  Being older than age 21.  Having had a C. diff infection before or a known exposure to C. diff bacteria.  Having a weak disease-fighting system (immune system).  Taking a medicine to reduce stomach acid, such as a proton pump inhibitor, for a long time.  Having a serious underlying condition, such as colon cancer or inflammatory bowel disease (IBD).  Having had a gastrointestinal (GI) tract procedure or surgery. Some people develop this infection even though they do not have any known risk factors. What are the signs or symptoms? Symptoms of this condition include:  Diarrhea (three or more times a day) for several  days.  Fever.  Tiredness (fatigue).  Loss of appetite.  Nausea.  Swelling, pain, cramping, or tenderness in the abdomen. How is this diagnosed? This condition is diagnosed with:  Your medical history and a physical exam.  Tests, which may include: ? A test for C. diff in your stool (feces). ? Blood tests. ? Imaging tests, such as a CT scan of your abdomen.  A procedure in which your health care provider can look inside your colon. This is rare. How is this treated? Treatment for this condition may include:  Stopping the antibiotics that you were taking when the C. diff infection began. Do this only as told by your health care provider.  Taking certain antibiotics to stop C. diff growth.  Taking donor stool from a healthy person and placing it into the colon (fecal transplant). This may be done if the infection keeps coming back.  Having surgery to remove the infected part of the  colon. This is rare. Follow these instructions at home: Medicines  Take over-the-counter and prescription medicines only as told by your health care provider.  Take your antibiotic medicine as told by your health care provider. Do not stop taking the antibiotic even if you start to feel better.  Do not treat diarrhea with medicines unless your health care provider tells you to. Eating and drinking   Follow instructions from your health care provider about eating or drinking restrictions.  Eat foods that are bland and easy to digest in small amounts as you can. These foods include bananas, applesauce, rice, lean meats, toast, and crackers.  Follow instructions on how to replace body fluid that has been lost (rehydrate). This may include: ? Drinking clear fluids, such as water, clear fruit juice, and low-calorie sports drinks. ? Sucking on ice chips. ? Taking an oral rehydration solution (ORS). This drink is sold at pharmacies and retail stores.  Avoid milk, caffeine, and alcohol.  Drink  enough fluid to keep your urine pale yellow. Activity  Rest as told by your health care provider.  Return to your normal activities as told by your health care provider. Ask your health care provider what activities are safe for you. General instructions  Wash your hands often with soap and water for at least 20 seconds. Bathe using soap and water daily.  Be sure your home is clean before you leave the hospital or clinic to go home. Continue daily cleaning for at least a week after going home.  Keep all follow-up visits as told by your health care provider. This is important. How is this prevented? Hand hygiene   Wash your hands thoroughly with soap and water for at least 20 seconds before preparing food and after using the bathroom. Make sure the people you live with also wash their hands often with soap and water for at least 20 seconds.  If you are being treated at a hospital or clinic, make sure that all health care providers and visitors wash their hands with soap and water before touching you. Contact precautions  If you develop diarrhea while in the hospital or a long-term care facility, tell your health care team right away.  When visiting someone in the hospital or a long-term care facility, follow guidelines for wearing a gown, gloves, or other protective equipment.  If possible, avoid contact with people who have diarrhea.  If you are sick and live with other people, use a separate bathroom, if possible. Clean environment  Clean surfaces that are touched often every day. C. diff bacteria are very resistant to cleaning and can live for months on surfaces. The bacteria are killed only by cleaning products containing 10%-solution chlorine bleach. Be sure to: ? Read the manufacturer's instructions or read online resources to find out if the product you are using will work for the surface you are cleaning. ? Clean frequently touched surfaces, such as toilets (remember the flush  handle), bathtubs, sinks, door knobs, and work surfaces.  If you are in the hospital, make sure that staff members clean the surfaces in your room daily. Let a staff person know right away if body fluids have splashed or spilled. Washing clothes and linens  Use a laundry detergent containing chlorine bleach. Be sure to use powder detergent instead of liquid. Only some liquid detergents have bleaching agents that contain chlorine bleach. Powder detergents contain chlorine bleach in low levels to help kill any bacteria.  Run your washing machine on the  hot setting once a month without clothes or linens but with enough detergent for washing at full capacity. This will kill any remaining C. diff bacteria. Contact a health care provider if:  Your symptoms do not get better, or they get worse, even with treatment.  Your symptoms go away and then come back.  You have a fever.  You develop new symptoms. Get help right away if:  You have more pain or tenderness in your abdomen.  You have stool that is mostly bloody, or your stool looks dark black and tarry.  You cannot eat or drink without vomiting.  You have signs or symptoms of dehydration, such as: ? Dark urine, very little urine, or no urine. ? Cracked lips or dry mouth. ? Not making tears when you cry. ? Sunken eyes. ? Sleepiness. ? Weakness or dizziness. Summary  Clostridioides difficile, or C. diff, infection happens when too much C. diff bacteria grows and causes inflammation of the colon (colitis) after antibiotic use.  Symptoms of this infection include diarrhea, fever, tiredness (fatigue), loss of appetite, nausea, and symptoms affecting the abdomen.  This infection may be treated by stopping the antibiotics you were using when the infection began, and then taking certain antibiotics to stop C. diff from growing. Sometimes, fecal transplant or surgery is done.  Handwashing, contact precautions, a clean environment, and  washing clothes and linens can help prevent or limit spread of this infection. This information is not intended to replace advice given to you by your health care provider. Make sure you discuss any questions you have with your health care provider. Document Revised: 09/25/2018 Document Reviewed: 09/25/2018 Elsevier Patient Education  2020 [employer].

## 2019-11-21 NOTE — Progress Notes (Signed)
Assessment and Plan:  Wayne [name] was seen today for chills, diarrhea and generalized body aches.  Diagnoses and all orders for this visit:  Antibiotic-associated diarrhea Diarrhea of presumed infectious origin Onset with abx use, not resolved with stopping agent, 2 week duration  Watery diarrhea with very foul odor  Main concern is r/o c. Diff; will check pathogen panel  Advised to continue to hold off on imodium, etc until results May try soluble fiber, bland diet, alternate water with electrolyte supplemented drink while having watery stools Treat any infectious etiology as indicate If ruled out start imodium/peptobismol  Please go to the ER if you have any severe AB pain, unable to hold down food/water, blood in stool or vomit, chest pain, shortness of breath, or any worsening symptoms.  -     Gastrointestinal Pathogen Panel PCR  Hemorrhoid Mild irritative sx likely secondary to diarrhea; suggested OTC products to try Witch hazel, prep H; follow up if not improving or any bleeding Donut if needed for discomort   Further disposition pending results of labs. Discussed med's effects and SE's.   Over 15 minutes of exam, counseling, chart review, and critical decision making was performed.   Future Appointments  Date Time Provider Department Center  01/09/2020  8:45 AM Judd Gaudier, NP [facility] None  05/19/2020  3:00 PM Lucky Cowboy, MD [facility] None  10/05/2020  9:00 AM Judd Gaudier, NP [facility] None    ------------------------------------------------------------------------------------------------------------------   HPI BP 110/70   Pulse 81   Temp (!) 95.9 F (35.5 C)   Ht 6' (1.829 m)   Wt 231 lb (104.8 kg)   SpO2 98%   BMI 31.33 kg/m   69 y.o.male presents for evaluation of 2 weeks of diarrhea; he was initiated on course of augmentin BID for nasal furuncle, immediately started having severe diarrhea, presented for evaluation here and was switched to doxycycline  which did improve severity, but then he reports noted a change in character, very foul odor and dark brown color of stool (not black, without blood, no mucus or floating stools). He reports now having less, having 4 episodes diarrhea episodes per day (w/o urge, cramping, pain), but reports has continuous watery leakage/ mild soilage, with lots of gas, some watery diarrhea with lumps.   He endorses intermittent chills, no fever, denies HA, dizziness, cramping. Does report some fatigue and achy/tired muscles. Appetite is good, denies nausea. He has not tried imodium, peptobismol. Was recommended probiotic but never started. He also reports irritated external hemorrhoid without noted bleeding but with itching. Hasn't tried OTC products.   Past Medical History:  Diagnosis Date  . Allergy   . Anxiety    On Xanax  . Chest pain   . Fatigue   . GERD (gastroesophageal reflux disease)   . Heart murmur    hx of MVP   . HTN (hypertension)   . Hyperlipemia   . IBS (irritable bowel syndrome)   . Medication management 06/13/2013  . Mitral valve prolapse   . Obesity (BMI 30.0-34.9) 08/20/2015  . OSA (obstructive sleep apnea)    Restated CPAP  . Sleep apnea    no cpap   . T2_NIDDM   . Testosterone deficiency 08/20/2015  . Type II or unspecified type diabetes mellitus without mention of complication, not stated as uncontrolled   . Vitamin D deficiency      No Known Allergies  Current Outpatient Medications on File Prior to Visit  Medication Sig  . aspirin 81 MG tablet Take 81  mg by mouth daily.  Marland Kitchen atenolol (TENORMIN) 100 MG tablet TAKE ONE TABLET BY MOUTH DAILY FOR BLOOD PRESSURE  . citalopram (CELEXA) 40 MG tablet Take 1 tablet Daily for Mood  . Continuous Blood Gluc Sensor (FREESTYLE LIBRE 14 DAY SENSOR) MISC USE TO CHECK BLOOD SUGARS THREE TIMES A DAY AS DIRECTED  . doxycycline (VIBRA-TABS) 100 MG tablet Take 1 tablet (100 mg total) by mouth 2 (two) times daily for 10 days.  Marland Kitchen glucose blood  (ONETOUCH VERIO) test strip Check blood sugar 1 time a day-[id number].  . HYDROcodone-acetaminophen (NORCO/VICODIN) 5-325 MG tablet Take 1 tablet by mouth every 6 (six) hours as needed for severe pain.  Marland Kitchen losartan-hydrochlorothiazide (HYZAAR) 100-25 MG tablet TAKE ONE TABLET BY MOUTH DAILY FOR BLOOD PRESSURE AND FLUID RETENTION  . Magnesium 400 MG CAPS Take 1 capsuile Daily  . metFORMIN (GLUCOPHAGE-XR) 500 MG 24 hr tablet TAKE TWO TABLETS BY MOUTH TWICE A DAY WITH MEALS FOR DIABETES  . montelukast (SINGULAIR) 10 MG tablet Take 1 tablet Daily for Allergies  . pantoprazole (PROTONIX) 40 MG tablet Take 40 mg by mouth daily.  . Probiotic Product (PROBIOTIC DAILY PO) Take by mouth daily.  . rosuvastatin (CRESTOR) 10 MG tablet TAKE ONE TABLET BY MOUTH DAILY FOR CHOLESTEROL  . VITAMIN D PO Take 5,000 Units by mouth. Takes about 1 time a week.  . zinc gluconate 50 MG tablet Take 50 mg by mouth daily.   No current facility-administered medications on file prior to visit.    ROS: all negative except above.   Physical Exam:  BP 110/70   Pulse 81   Temp (!) 95.9 F (35.5 C)   Ht 6' (1.829 m)   Wt 231 lb (104.8 kg)   SpO2 98%   BMI 31.33 kg/m   General Appearance: Well nourished, in no apparent distress. Eyes: PERRLA, EOMs, conjunctiva no swelling or erythema Sinuses: No Frontal/maxillary tenderness ENT/Mouth: Ext aud canals clear, TMs without erythema, bulging. No erythema, swelling, or exudate on post pharynx.  Tonsils not swollen or erythematous. Hearing normal.  Neck: Supple, thyroid normal.  Respiratory: Respiratory effort normal, BS equal bilaterally without rales, rhonchi, wheezing or stridor.  Cardio: RRR with no MRGs. Brisk peripheral pulses without edema.  Abdomen: Soft, + BS.  Mild generalized tenderness, no guarding, rebound, hernias, masses. Lymphatics: Non tender without lymphadenopathy.  Musculoskeletal: No obvious deformity, symmetrical strength, normal gait.  Skin: Warm, dry  without rashes, lesions, ecchymosis.  Neuro: Normal muscle tone, Sensation intact.  Psych: Awake and oriented X 3, normal affect, Insight and Judgment appropriate.     Dan Maker, NP 9:52 AM [facility]

## 2019-12-05 DIAGNOSIS — M5416 Radiculopathy, lumbar region: Secondary | ICD-10-CM | POA: Diagnosis not present

## 2020-01-06 ENCOUNTER — Other Ambulatory Visit: Payer: Self-pay | Admitting: Cardiology

## 2020-01-06 DIAGNOSIS — Z7189 Other specified counseling: Secondary | ICD-10-CM

## 2020-01-07 NOTE — Progress Notes (Signed)
FOLLOW UP  Assessment and Plan:   Atherosclerosis of aorta Control blood pressure, cholesterol, glucose, increase exercise.   CAD Control blood pressure, cholesterol, glucose, increase exercise.  Dr. Dulce Sellar is following; reminded due to follow up  Hypertension Well controlled with current medications; on ARB Monitor blood pressure at home; patient to call if consistently greater than 130/80 Continue DASH diet.   Reminder to go to the ER if any CP, SOB, nausea, dizziness, severe HA, changes vision/speech, left arm numbness and tingling and jaw pain.  Cholesterol Continue statin medication for LDL goal <70 Continue low cholesterol diet and exercise.  Check lipid panel.   Diabetes without complications Continue medication: metformin  RESTART CHECKING SUGARS Continue diet and exercise.  Perform daily foot/skin check, notify office of any concerning changes.  Check A1C  Anxiety Well managed by current regimen;  Stress management techniques discussed, increase water, good sleep hygiene discussed, increase exercise, and increase veggies.   Morbid obesity - BMI 30 + with OSA, T2DM Long discussion about weight loss, diet, and exercise Discussed ideal weight for height and initial weight goal Patient will work on portion sizes and gentle exercise Will follow up in 3 months  Vitamin D Def/ osteoporosis prevention Continue supplementation; increased dose as indicated for goal of 60-100 Check vitamin D  OSA Discussed risks of complications with CPAP non-compliance including cardiac arrhythmias; barriers are r/t home sleeping arrangements; possible solutions suggested; He feels confident can increase compliance  Continue diet and meds as discussed. Further disposition pending results of labs. Discussed med's effects and SE's.   Over 30 minutes of exam, counseling, chart review, and critical decision making was performed.   Future Appointments  Date Time Provider Department  Center  05/19/2020  3:00 PM Lucky Cowboy, MD [facility] None  10/05/2020  9:00 AM Judd Gaudier, NP [facility] None    ----------------------------------------------------------------------------------------------------------------------  HPI 69 y.o. male  presents for 3 month follow up on hypertension, cholesterol, T2 diabetes, CKD, vitamin D def, CAD, anxiety.  he has a diagnosis of anxiety and is currently prescribed celexa 40 mg daily, reports symptoms are well controlled on current regimen.   He is on CPAP for mild/mod OSA, states he is breathing better, having less headaches and has more energy when he wakes up, but admits only 50% compliance due to not sleeping in bed consistently.   BMI is Body mass index is 31.46 kg/m., he has been working on diet and exercises intermittently- somewhat limited recently by back pain and work schedule. Wt Readings from Last 3 Encounters:  01/09/20 232 lb (105.2 kg)  11/21/19 231 lb (104.8 kg)  11/18/19 233 lb (105.7 kg)   he has a diagnosis of GERD which is currently managed by protonix 40 mg daily after poor control with H2i    He has aortic atherosclerosis per CT 10/2018 Coronary calcium score 301, then had CTA with about 50 % at [facility]. Dr. Dulce Sellar  His blood pressure has been controlled at home (110-120s/70-80s), today their BP is BP: 126/80  He does not workout. He denies chest pain, shortness of breath. He has had some episodes of dizziness with position changes, admits typically on days working out of the office and not hydrating well    He is on cholesterol medication (rosuvasatin 10 mg daily) and denies myalgias. His cholesterol is at goal. The cholesterol last visit was:   Lab Results  Component Value Date   CHOL 119 10/03/2019   HDL 39 (L) 10/03/2019   LDLCALC 64  10/03/2019   TRIG 77 10/03/2019   CHOLHDL 3.1 10/03/2019    He has been working on diet and exercise for T2 diabetes (on metformin 2000 mg daily), has been using the  [place]arding but admits hasn't been using (was having problems with sensor, has been corrected) and denies increased appetite, nausea, paresthesia of the feet, polydipsia, polyuria, visual disturbances and vomiting.   He reports hasn't checked glucose in the last 6 months Last A1C in the office was:  Lab Results  Component Value Date   HGBA1C 7.8 (H) 10/03/2019   Lab Results  Component Value Date   GFRAA 98 10/03/2019   Patient is on Vitamin D supplement but remained below goal at the last check, has increased to 10000 IU daily up from 5000 IU:    Lab Results  Component Value Date   VD25OH 36 04/23/2019        Current Medications:  Current Outpatient Medications on File Prior to Visit  Medication Sig  . aspirin 81 MG tablet Take 81 mg by mouth daily.  Marland Kitchen atenolol (TENORMIN) 100 MG tablet TAKE ONE TABLET BY MOUTH DAILY FOR BLOOD PRESSURE  . citalopram (CELEXA) 40 MG tablet Take 1 tablet Daily for Mood  . Continuous Blood Gluc Sensor (FREESTYLE LIBRE 14 DAY SENSOR) MISC USE TO CHECK BLOOD SUGARS THREE TIMES A DAY AS DIRECTED  . glucose blood (ONETOUCH VERIO) test strip Check blood sugar 1 time a day-DX-E11.22.  . losartan-hydrochlorothiazide (HYZAAR) 100-25 MG tablet TAKE ONE TABLET BY MOUTH DAILY FOR BLOOD PRESSURE AND FLUID RETENTION  . Magnesium 400 MG CAPS Take 1 capsuile Daily  . metFORMIN (GLUCOPHAGE-XR) 500 MG 24 hr tablet TAKE TWO TABLETS BY MOUTH TWICE A DAY WITH MEALS FOR DIABETES  . montelukast (SINGULAIR) 10 MG tablet Take 1 tablet Daily for Allergies  . Probiotic Product (PROBIOTIC DAILY PO) Take by mouth daily.  . rosuvastatin (CRESTOR) 10 MG tablet TAKE ONE TABLET BY MOUTH DAILY FOR CHOLESTEROL  . VITAMIN D PO Take 5,000 Units by mouth. Takes about 1 time a week.  . zinc gluconate 50 MG tablet Take 50 mg by mouth daily.  Marland Kitchen HYDROcodone-acetaminophen (NORCO/VICODIN) 5-325 MG tablet Take 1 tablet by mouth every 6 (six) hours as needed for severe pain. (Patient not taking: Reported  on 01/09/2020)  . pantoprazole (PROTONIX) 40 MG tablet Take 40 mg by mouth daily. (Patient not taking: Reported on 01/09/2020)   No current facility-administered medications on file prior to visit.     Allergies: No Known Allergies   Medical History:  Past Medical History:  Diagnosis Date  . Allergy   . Anxiety    On Xanax  . Chest pain   . Fatigue   . GERD (gastroesophageal reflux disease)   . Heart murmur    hx of MVP   . HTN (hypertension)   . Hyperlipemia   . IBS (irritable bowel syndrome)   . Medication management 06/13/2013  . Mitral valve prolapse   . Obesity (BMI 30.0-34.9) 08/20/2015  . OSA (obstructive sleep apnea)    Restated CPAP  . Sleep apnea    no cpap   . T2_NIDDM   . Testosterone deficiency 08/20/2015  . Type II or unspecified type diabetes mellitus without mention of complication, not stated as uncontrolled   . Vitamin D deficiency    Family history- Reviewed and unchanged Social history- Reviewed and unchanged   Review of Systems:  Review of Systems  Constitutional: Negative for malaise/fatigue and weight loss.  HENT:  Negative for hearing loss and tinnitus.   Eyes: Negative for blurred vision and double vision.  Respiratory: Negative for cough, shortness of breath and wheezing.   Cardiovascular: Negative for chest pain, palpitations, orthopnea, claudication and leg swelling.  Gastrointestinal: Negative for abdominal pain, blood in stool, constipation, diarrhea, heartburn, melena, nausea and vomiting.  Genitourinary: Negative.   Musculoskeletal: Negative for joint pain and myalgias.  Skin: Negative for rash.  Neurological: Positive for dizziness (after position changes intermittently). Negative for tingling, sensory change, weakness and headaches.  Endo/Heme/Allergies: Negative for polydipsia.  Psychiatric/Behavioral: Negative for depression and substance abuse. The patient is not nervous/anxious (Rare, after bad day at work) and does not have insomnia  (Rare, related to anxiety/stress).   All other systems reviewed and are negative.   Physical Exam: BP 126/80   Pulse (!) 53   Temp (!) 96.5 F (35.8 C)   Wt 232 lb (105.2 kg)   SpO2 92%   BMI 31.46 kg/m  Wt Readings from Last 3 Encounters:  01/09/20 232 lb (105.2 kg)  11/21/19 231 lb (104.8 kg)  11/18/19 233 lb (105.7 kg)   General Appearance: Well nourished, in no apparent distress. Eyes: PERRLA, EOMs, conjunctiva no swelling or erythema Sinuses: No Frontal/maxillary tenderness ENT/Mouth: Ext aud canals clear, TMs without erythema, bulging. No erythema, swelling, or exudate on post pharynx.  Tonsils not swollen or erythematous. Hearing normal.  Neck: Supple, thyroid normal.  Respiratory: Respiratory effort normal, BS equal bilaterally without rales, rhonchi, wheezing or stridor.  Cardio: RRR with no MRGs. Brisk peripheral pulses without edema.  Abdomen: Soft, + BS.  Non tender, no guarding, rebound, hernias, masses. Lymphatics: Non tender without lymphadenopathy.  Musculoskeletal: Full ROM, 5/5 strength, Normal gait Skin: Warm, dry without rashes, lesions, ecchymosis.  Neuro: Cranial nerves intact. No cerebellar symptoms.  Psych: Awake and oriented X 3, normal affect, Insight and Judgment appropriate.    Dan Maker, NP 9:05 AM [facility]

## 2020-01-09 ENCOUNTER — Other Ambulatory Visit: Payer: Self-pay

## 2020-01-09 ENCOUNTER — Encounter: Payer: Self-pay | Admitting: Adult Health

## 2020-01-09 ENCOUNTER — Ambulatory Visit (INDEPENDENT_AMBULATORY_CARE_PROVIDER_SITE_OTHER): Payer: PPO | Admitting: Adult Health

## 2020-01-09 VITALS — BP 126/80 | HR 53 | Temp 96.5°F | Wt 232.0 lb

## 2020-01-09 DIAGNOSIS — E559 Vitamin D deficiency, unspecified: Secondary | ICD-10-CM

## 2020-01-09 DIAGNOSIS — E1169 Type 2 diabetes mellitus with other specified complication: Secondary | ICD-10-CM | POA: Diagnosis not present

## 2020-01-09 DIAGNOSIS — N182 Chronic kidney disease, stage 2 (mild): Secondary | ICD-10-CM

## 2020-01-09 DIAGNOSIS — I2510 Atherosclerotic heart disease of native coronary artery without angina pectoris: Secondary | ICD-10-CM | POA: Diagnosis not present

## 2020-01-09 DIAGNOSIS — F419 Anxiety disorder, unspecified: Secondary | ICD-10-CM | POA: Diagnosis not present

## 2020-01-09 DIAGNOSIS — I700 Atherosclerosis of aorta: Secondary | ICD-10-CM | POA: Diagnosis not present

## 2020-01-09 DIAGNOSIS — E785 Hyperlipidemia, unspecified: Secondary | ICD-10-CM

## 2020-01-09 DIAGNOSIS — G4733 Obstructive sleep apnea (adult) (pediatric): Secondary | ICD-10-CM | POA: Diagnosis not present

## 2020-01-09 DIAGNOSIS — I10 Essential (primary) hypertension: Secondary | ICD-10-CM | POA: Diagnosis not present

## 2020-01-09 DIAGNOSIS — E1122 Type 2 diabetes mellitus with diabetic chronic kidney disease: Secondary | ICD-10-CM

## 2020-01-09 NOTE — Patient Instructions (Addendum)
Goals     Exercise 150 min/wk Moderate Activity     HEMOGLOBIN A1C < 7     LDL CALC < 70     Weight (lb) < 210 lb (95.3 kg)      Please work on increasing fluid intake on days you work out of the office   Please restart checking sugars  Goal is fasting <130 and peak after eating never higher than 180   Dizziness Dizziness is a common problem. It makes you feel unsteady or light-headed. You may feel like you are about to pass out (faint). Dizziness can lead to getting hurt if you stumble or fall. Dizziness can be caused by many things, including:  Medicines.  Not having enough water in your body (dehydration).  Illness. Follow these instructions at home: Eating and drinking   Drink enough fluid to keep your pee (urine) clear or pale yellow. This helps to keep you from getting dehydrated. Try to drink more clear fluids, such as water.  Do not drink alcohol.  Limit how much caffeine you drink or eat, if your doctor tells you to do that.  Limit how much salt (sodium) you drink or eat, if your doctor tells you to do that. Activity   Avoid making quick movements. ? When you stand up from sitting in a chair, steady yourself until you feel okay. ? In the morning, first sit up on the side of the bed. When you feel okay, stand slowly while you hold onto something. Do this until you know that your balance is fine.  If you need to stand in one place for a long time, move your legs often. Tighten and relax the muscles in your legs while you are standing.  Do not drive or use heavy machinery if you feel dizzy.  Avoid bending down if you feel dizzy. Place items in your home so you can reach them easily without leaning over. Lifestyle  Do not use any products that contain nicotine or tobacco, such as cigarettes and e-cigarettes. If you need help quitting, ask your doctor.  Try to lower your stress level. You can do this by using methods such as yoga or meditation. Talk with  your doctor if you need help. General instructions  Watch your dizziness for any changes.  Take over-the-counter and prescription medicines only as told by your doctor. Talk with your doctor if you think that you are dizzy because of a medicine that you are taking.  Tell a friend or a family member that you are feeling dizzy. If he or she notices any changes in your behavior, have this person call your doctor.  Keep all follow-up visits as told by your doctor. This is important. Contact a doctor if:  Your dizziness does not go away.  Your dizziness or light-headedness gets worse.  You feel sick to your stomach (nauseous).  You have trouble hearing.  You have new symptoms.  You are unsteady on your feet.  You feel like the room is spinning. Get help right away if:  You throw up (vomit) or have watery poop (diarrhea), and you cannot eat or drink anything.  You have trouble: ? Talking. ? Walking. ? Swallowing. ? Using your arms, hands, or legs.  You feel generally weak.  You are not thinking clearly, or you have trouble forming sentences. A friend or family member may notice this.  You have: ? Chest pain. ? Pain in your belly (abdomen). ? Shortness of breath. ? Sweating.  Your vision changes.  You are bleeding.  You have a very bad headache.  You have neck pain or a stiff neck.  You have a fever. These symptoms may be an emergency. Do not wait to see if the symptoms will go away. Get medical help right away. Call your local emergency services (911 in the [place] Do not drive yourself to the hospital. Summary  Dizziness makes you feel unsteady or light-headed. You may feel like you are about to pass out (faint).  Drink enough fluid to keep your pee (urine) clear or pale yellow. Do not drink alcohol.  Avoid making quick movements if you feel dizzy.  Watch your dizziness for any changes. This information is not intended to replace advice given to you by your  health care provider. Make sure you discuss any questions you have with your health care provider. Document Revised: 05/04/2017 Document Reviewed: 05/18/2016 Elsevier Patient Education  2020 [employer].

## 2020-01-10 ENCOUNTER — Other Ambulatory Visit: Payer: Self-pay | Admitting: Adult Health

## 2020-01-10 DIAGNOSIS — E782 Mixed hyperlipidemia: Secondary | ICD-10-CM

## 2020-01-10 LAB — COMPLETE METABOLIC PANEL WITH GFR
AG Ratio: 1.7 (calc) (ref 1.0–2.5)
ALT: 16 U/L (ref 9–46)
AST: 17 U/L (ref 10–35)
Albumin: 4.3 g/dL (ref 3.6–5.1)
Alkaline phosphatase (APISO): 53 U/L (ref 35–144)
BUN: 17 mg/dL (ref 7–25)
CO2: 33 mmol/L — ABNORMAL HIGH (ref 20–32)
Calcium: 10.1 mg/dL (ref 8.6–10.3)
Chloride: 100 mmol/L (ref 98–110)
Creat: 0.91 mg/dL (ref 0.70–1.25)
GFR, Est African American: 101 mL/min/{1.73_m2} (ref >=60)
GFR, Est Non African American: 87 mL/min/{1.73_m2} (ref >=60)
Globulin: 2.6 g/dL (calc) (ref 1.9–3.7)
Glucose, Bld: 147 mg/dL — ABNORMAL HIGH (ref 65–99)
Potassium: 4.3 mmol/L (ref 3.5–5.3)
Sodium: 137 mmol/L (ref 135–146)
Total Bilirubin: 0.5 mg/dL (ref 0.2–1.2)
Total Protein: 6.9 g/dL (ref 6.1–8.1)

## 2020-01-10 LAB — CBC WITH DIFFERENTIAL/PLATELET
Absolute Monocytes: 418 cells/uL (ref 200–950)
Basophils Absolute: 61 cells/uL (ref 0–200)
Basophils Relative: 1.1 %
Eosinophils Absolute: 198 cells/uL (ref 15–500)
Eosinophils Relative: 3.6 %
HCT: 39.7 % (ref 38.5–50.0)
Hemoglobin: 13 g/dL — ABNORMAL LOW (ref 13.2–17.1)
Lymphs Abs: 2998 cells/uL (ref 850–3900)
MCH: 30.7 pg (ref 27.0–33.0)
MCHC: 32.7 g/dL (ref 32.0–36.0)
MCV: 93.6 fL (ref 80.0–100.0)
MPV: 10.5 fL (ref 7.5–12.5)
Monocytes Relative: 7.6 %
Neutro Abs: 1826 cells/uL (ref 1500–7800)
Neutrophils Relative %: 33.2 %
Platelets: 283 10*3/uL (ref 140–400)
RBC: 4.24 10*6/uL (ref 4.20–5.80)
RDW: 12 % (ref 11.0–15.0)
Total Lymphocyte: 54.5 %
WBC: 5.5 10*3/uL (ref 3.8–10.8)

## 2020-01-10 LAB — HEMOGLOBIN A1C
Hgb A1c MFr Bld: 8 % of total Hgb — ABNORMAL HIGH (ref <5.7)
Mean Plasma Glucose: 183 (calc)
eAG (mmol/L): 10.1 (calc)

## 2020-01-10 LAB — VITAMIN D 25 HYDROXY (VIT D DEFICIENCY, FRACTURES): Vit D, 25-Hydroxy: 87 ng/mL (ref 30–100)

## 2020-01-10 LAB — LIPID PANEL
Cholesterol: 134 mg/dL (ref <200)
HDL: 42 mg/dL (ref >=40)
LDL Cholesterol (Calc): 77 mg/dL (calc)
Non-HDL Cholesterol (Calc): 92 mg/dL (calc) (ref <130)
Total CHOL/HDL Ratio: 3.2 (calc) (ref <5.0)
Triglycerides: 73 mg/dL (ref <150)

## 2020-01-10 LAB — MAGNESIUM: Magnesium: 1.8 mg/dL (ref 1.5–2.5)

## 2020-01-10 LAB — TSH: TSH: 1.48 mIU/L (ref 0.40–4.50)

## 2020-01-10 MED ORDER — ROSUVASTATIN CALCIUM 20 MG PO TABS: ORAL_TABLET | ORAL | 1 refills | Status: DC

## 2020-01-16 ENCOUNTER — Other Ambulatory Visit: Payer: Self-pay | Admitting: Adult Health

## 2020-01-16 DIAGNOSIS — E119 Type 2 diabetes mellitus without complications: Secondary | ICD-10-CM

## 2020-01-16 DIAGNOSIS — I10 Essential (primary) hypertension: Secondary | ICD-10-CM

## 2020-02-06 ENCOUNTER — Other Ambulatory Visit: Payer: Self-pay | Admitting: Adult Health

## 2020-02-06 DIAGNOSIS — E782 Mixed hyperlipidemia: Secondary | ICD-10-CM

## 2020-02-06 DIAGNOSIS — I10 Essential (primary) hypertension: Secondary | ICD-10-CM

## 2020-03-07 NOTE — Progress Notes (Signed)
Assessment and Plan:  Wayne [name] was seen today for cyst.  Diagnoses and all orders for this visit:  Folliculitis Ingrown hair Keep area clean, hygiene reviewed, may try trimming beard rather than close shave If using razor change frequently, keep equipment clean; clean skin and use moist heat prior Ingrown hair with folliculitis and subcutaneous nodule today No discharge for culture; will give doxycycline 7-10 days to help with resolving When closer to surface hot compresses may help open skin to express Follow up if not resolving after abx, may need to attempt lancing/removal in procedure room -     doxycycline (VIBRAMYCIN) 100 MG capsule; Take 1 capsule 2 x/day with food for 10 days.  Need for immunization against influenza -     Flu vaccine HIGH DOSE PF  Further disposition pending results of labs. Discussed med's effects and SE's.   Over 15 minutes of exam, counseling, chart review, and critical decision making was performed.   Future Appointments  Date Time Provider Department Center  05/19/2020  3:00 PM Lucky Cowboy, MD [facility] None  10/05/2020  9:00 AM Judd Gaudier, NP [facility] None    ------------------------------------------------------------------------------------------------------------------   HPI BP 116/70   Pulse (!) 56   Temp (!) 96.4 F (35.8 C)   Wt 225 lb (102.1 kg)   SpO2 97%   BMI 30.52 kg/m   69 y.o.male with T2DM, CAD presents for evaluation of infected ingrown hair to his neck.   He reports hx of frequent ingrown hairs, has been wearing a beard with reduced frequency of shaving but reports had new ingrown hair to R neck 4 weeks ago, has been trying some hot compresses and to squeeze but without success. Now with stable mildly tender firm lump to neck. Denies local pain, fever/chills.   Past Medical History:  Diagnosis Date  . Allergy   . Anxiety    On Xanax  . Chest pain   . Fatigue   . GERD (gastroesophageal reflux disease)   . Heart  murmur    hx of MVP   . HTN (hypertension)   . Hyperlipemia   . IBS (irritable bowel syndrome)   . Medication management 06/13/2013  . Mitral valve prolapse   . Obesity (BMI 30.0-34.9) 08/20/2015  . OSA (obstructive sleep apnea)    Restated CPAP  . Sleep apnea    no cpap   . T2_NIDDM   . Testosterone deficiency 08/20/2015  . Type II or unspecified type diabetes mellitus without mention of complication, not stated as uncontrolled   . Vitamin D deficiency      No Known Allergies  Current Outpatient Medications on File Prior to Visit  Medication Sig  . aspirin 81 MG tablet Take 81 mg by mouth daily.  Marland Kitchen atenolol (TENORMIN) 100 MG tablet Take 1 tablet     Daily     for BP  . citalopram (CELEXA) 40 MG tablet Take 1 tablet Daily for Mood  . Continuous Blood Gluc Sensor (FREESTYLE LIBRE 14 DAY SENSOR) MISC USE TO CHECK BLOOD SUGARS THREE TIMES A DAY AS DIRECTED  . glucose blood (ONETOUCH VERIO) test strip Check blood sugar 1 time a day-DX-E11.22.  . losartan-hydrochlorothiazide (HYZAAR) 100-25 MG tablet Take     1 tablet    Daily     for BP & Fluid Retention / Ankle Swelling  . Magnesium 400 MG CAPS Take 1 capsuile Daily  . metFORMIN (GLUCOPHAGE-XR) 500 MG 24 hr tablet Take 2 tablets     2 x /day  With Meals for Diabetes  . montelukast (SINGULAIR) 10 MG tablet Take 1 tablet Daily for Allergies  . pantoprazole (PROTONIX) 40 MG tablet Take 40 mg by mouth daily.   . Probiotic Product (PROBIOTIC DAILY PO) Take by mouth daily.  . rosuvastatin (CRESTOR) 20 MG tablet TAKE ONE TABLET BY MOUTH DAILY FOR CHOLESTEROL  . VITAMIN D PO Take 10,000 Units by mouth daily.   Marland Kitchen zinc gluconate 50 MG tablet Take 50 mg by mouth daily.  Marland Kitchen HYDROcodone-acetaminophen (NORCO/VICODIN) 5-325 MG tablet Take 1 tablet by mouth every 6 (six) hours as needed for severe pain. (Patient not taking: Reported on 01/09/2020)   No current facility-administered medications on file prior to visit.    ROS: all negative except  above.   Physical Exam:  BP 116/70   Pulse (!) 56   Temp (!) 96.4 F (35.8 C)   Wt 225 lb (102.1 kg)   SpO2 97%   BMI 30.52 kg/m   General Appearance: Well nourished, in no apparent distress. Eyes: conjunctiva no swelling or erythema ENT/Mouth:  No erythema, swelling, or exudate on post pharynx.  Tonsils not swollen or erythematous. Hearing normal.  Neck: Supple, thyroid normal.  Respiratory: Respiratory effort normal Cardio: Appears well perfused, Brisk peripheral pulses without edema.  Abdomen: Soft, + BS.  Non tender, no guarding, rebound, hernias, masses. Lymphatics: Non tender without lymphadenopathy.  Musculoskeletal: Fnormal gait.  Skin: Warm, dry; he has nodular area approx 0.5 cm subcutaneous to R anterior neck with erythematous follicle overlying; no heat, no discharge, unable to express Neuro: Normal muscle tone, Sensation intact.  Psych: Awake and oriented X 3, normal affect, Insight and Judgment appropriate.     Dan Maker, NP 3:52 PM [facility]

## 2020-03-08 ENCOUNTER — Encounter: Payer: Self-pay | Admitting: Adult Health

## 2020-03-08 ENCOUNTER — Other Ambulatory Visit: Payer: Self-pay

## 2020-03-08 ENCOUNTER — Ambulatory Visit (INDEPENDENT_AMBULATORY_CARE_PROVIDER_SITE_OTHER): Payer: PPO | Admitting: Adult Health

## 2020-03-08 VITALS — BP 116/70 | HR 56 | Temp 96.4°F | Wt 225.0 lb

## 2020-03-08 DIAGNOSIS — L739 Follicular disorder, unspecified: Secondary | ICD-10-CM

## 2020-03-08 DIAGNOSIS — Z23 Encounter for immunization: Secondary | ICD-10-CM

## 2020-03-08 DIAGNOSIS — L731 Pseudofolliculitis barbae: Secondary | ICD-10-CM | POA: Diagnosis not present

## 2020-03-08 DIAGNOSIS — E1122 Type 2 diabetes mellitus with diabetic chronic kidney disease: Secondary | ICD-10-CM

## 2020-03-08 DIAGNOSIS — I10 Essential (primary) hypertension: Secondary | ICD-10-CM

## 2020-03-08 MED ORDER — DOXYCYCLINE HYCLATE 100 MG PO CAPS: ORAL_CAPSULE | ORAL | 0 refills | Status: DC

## 2020-03-08 NOTE — Patient Instructions (Addendum)
Ingrown Hair  An ingrown hair is a hair that curls and re-enters the skin instead of growing straight out of the skin. An ingrown hair can develop in any part of the skin that hair is removed from. An ingrown hair may cause small pockets of infection. What are the causes? An ingrown hair may be caused by:  Shaving.  Tweezing.  Waxing.  Using a hair removal cream. What increases the risk? You are more likely to develop this condition if you have curly hair. What are the signs or symptoms? Symptoms of an ingrown hair may include:  Small bumps on the skin. The bumps may be filled with pus.  Pain.  Itching. How is this diagnosed? An ingrown hair is diagnosed by a skin exam. How is this treated? Treatment is often not needed unless the ingrown hair has caused an infection. If needed, treatment may include:  Applying prescription creams to the skin. This can help with inflammation.  Applying warm compresses to the skin. This can help soften the skin.  Taking antibiotic medicine. An antibiotic may be prescribed if the infection is severe.  Retracting and releasing the ingrown hair tips. Follow these instructions at home:  Medicines  Take, apply, or use over-the-counter and prescription medicines only as told by your health care provider. This includes any prescription creams.  If you were prescribed an antibiotic medicine, take it as told by your health care provider. Do not stop using the antibiotic even if you start to feel better. General instructions  Do not shave irritated areas of skin. You may start shaving these areas again once the irritation has gone away.  To help remove ingrown hairs on your face, you may use a facial sponge in a gentle circular motion.  Do not pick or squeeze the irritated area of skin as this may cause infection and scarring. Managing pain and swelling  If directed, apply heat to the affected area as often as told by your health care  provider. Use the heat source that your health care provider recommends, such as a moist heat pack or a heating pad. ? Place a towel between your skin and the heat source. ? Leave the heat on for 20-30 minutes. ? Remove the heat if your skin turns bright red. This is especially important if you are unable to feel pain, heat, or cold. You may have a greater risk of getting burned. How is this prevented?  Shower before shaving.  Wrap areas that you are going to shave in warm, moist wraps for several minutes before shaving. The warmth and moisture help to soften the hairs and makes ingrown hairs less likely.  Use thick shaving gels.  Use a razor that cuts hair slightly above your skin, or use an electric shaver with a long shave setting.  Shave in the direction of hair growth.  Avoid making multiple razor strokes.  Apply a moisturizing lotion after shaving. Summary  An ingrown hair is a hair that curls and re-enters the skin instead of growing straight out of the skin.  Treatment is often not needed unless the ingrown hair has caused an infection.  Take, apply, or use over-the-counter and prescription medicines only as told by your health care provider. This includes any prescription creams.  Do not shave irritated areas of skin. You may start shaving these areas again once the irritation has gone away.  If directed, apply heat to the affected area. Use the heat source that your health care provider  recommends, such as a moist heat pack or a heating pad. This information is not intended to replace advice given to you by your health care provider. Make sure you discuss any questions you have with your health care provider. Document Revised: 11/07/2017 Document Reviewed: 11/07/2017 Elsevier Patient Education  [place].      Doxycycline tablets or capsules What is this medicine? DOXYCYCLINE (dox i SYE kleen) is a tetracycline antibiotic. It kills certain bacteria or stops  their growth. It is used to treat many kinds of infections, like dental, skin, respiratory, and urinary tract infections. It also treats acne, Lyme disease, malaria, and certain sexually transmitted infections. This medicine may be used for other purposes; ask your health care provider or pharmacist if you have questions. COMMON BRAND NAME(S): Acticlate, Adoxa, Adoxa CK, Adoxa Pak, Adoxa TT, Alodox, Avidoxy, Doxal, LYMEPAK, Mondoxyne NL, Monodox, Morgidox 1x, Morgidox 1x Kit, Morgidox 2x, Morgidox 2x Kit, NutriDox, Ocudox, [place], [place], Vibra-Tabs, Vibramycin What should I tell my health care provider before I take this medicine? They need to know if you have any of these conditions:  liver disease  long exposure to sunlight like working outdoors  stomach problems like colitis  an unusual or allergic reaction to doxycycline, tetracycline antibiotics, other medicines, foods, dyes, or preservatives  pregnant or trying to get pregnant  breast-feeding How should I use this medicine? Take this medicine by mouth with a full glass of water. Follow the directions on the prescription label. It is best to take this medicine without food, but if it upsets your stomach take it with food. Take your medicine at regular intervals. Do not take your medicine more often than directed. Take all of your medicine as directed even if you think you are better. Do not skip doses or stop your medicine early. Talk to your pediatrician regarding the use of this medicine in children. While this drug may be prescribed for selected conditions, precautions do apply. Overdosage: If you think you have taken too much of this medicine contact a poison control center or emergency room at once. NOTE: This medicine is only for you. Do not share this medicine with others. What if I miss a dose? If you miss a dose, take it as soon as you can. If it is almost time for your next dose, take only that dose. Do not take double or  extra doses. What may interact with this medicine?  antacids  barbiturates  birth control pills  bismuth subsalicylate  carbamazepine  methoxyflurane  other antibiotics  phenytoin  vitamins that contain iron  warfarin This list may not describe all possible interactions. Give your health care provider a list of all the medicines, herbs, non-prescription drugs, or dietary supplements you use. Also tell them if you smoke, drink alcohol, or use illegal drugs. Some items may interact with your medicine. What should I watch for while using this medicine? Tell your doctor or health care professional if your symptoms do not improve. Do not treat diarrhea with over the counter products. Contact your doctor if you have diarrhea that lasts more than 2 days or if it is severe and watery. Do not take this medicine just before going to bed. It may not dissolve properly when you lay down and can cause pain in your throat. Drink plenty of fluids while taking this medicine to also help reduce irritation in your throat. This medicine can make you more sensitive to the sun. Keep out of the sun. If you  cannot avoid being in the sun, wear protective clothing and use sunscreen. Do not use sun lamps or tanning beds/booths. Birth control pills may not work properly while you are taking this medicine. Talk to your doctor about using an extra method of birth control. If you are being treated for a sexually transmitted infection, avoid sexual contact until you have finished your treatment. Your sexual partner may also need treatment. Avoid antacids, aluminum, calcium, magnesium, and iron products for 4 hours before and 2 hours after taking a dose of this medicine. If you are using this medicine to prevent malaria, you should still protect yourself from contact with mosquitos. Stay in screened-in areas, use mosquito nets, keep your body covered, and use an insect repellent. What side effects may I notice from  receiving this medicine? Side effects that you should report to your doctor or health care professional as soon as possible:  allergic reactions like skin rash, itching or hives, swelling of the face, lips, or tongue  difficulty breathing  fever  itching in the rectal or genital area  pain on swallowing  rash, fever, and swollen lymph nodes  redness, blistering, peeling or loosening of the skin, including inside the mouth  severe stomach pain or cramps  unusual bleeding or bruising  unusually weak or tired  yellowing of the eyes or skin Side effects that usually do not require medical attention (report to your doctor or health care professional if they continue or are bothersome):  diarrhea  loss of appetite  nausea, vomiting This list may not describe all possible side effects. Call your doctor for medical advice about side effects. You may report side effects to FDA at 1-800-FDA-1088. Where should I keep my medicine? Keep out of the reach of children. Store at room temperature, below 30 degrees C (86 degrees F). Protect from light. Keep container tightly closed. Throw away any unused medicine after the expiration date. Taking this medicine after the expiration date can make you seriously ill. NOTE: This sheet is a summary. It may not cover all possible information. If you have questions about this medicine, talk to your doctor, pharmacist, or health care provider.  2020 Elsevier/Gold Standard (2018-08-01 13:44:53)

## 2020-04-07 ENCOUNTER — Other Ambulatory Visit: Payer: Self-pay | Admitting: Adult Health

## 2020-04-07 MED ORDER — PREDNISONE 20 MG PO TABS: ORAL_TABLET | ORAL | 0 refills | Status: DC

## 2020-04-19 NOTE — Progress Notes (Signed)
   History of Present Illness:    Patient is a very nice 69 yo MBM presenting with a 2-3 day hx/o pain over the posterior arch of the sole of the left foot. Denies hx/o injury.  Medications   .  metFORMIN -XR 500 MG , Take 2 tablets 2 x /day      .  atenolol 100 MG tablet, Take 1 tablet Daily  .  losartan-hctz  100-25 MG tablet, Take  1 tablet Daily       .  rosuvastatin  20 MG tablet, TAKE ONE TABLET  DAILY   .  montelukast 10 MG tablet, Take 1 tablet Daily for Allergies  .  aspirin 81 MG tablet, Take 81 mg by mouth daily.  .  citalopram 40 MG tablet, Take 1 tablet Daily for Mood  .  Magnesium 400 MG , Take 1 capsule Daily  .  pantoprazole 40 MG tablet, Take  daily.   .  Probiotic , Take  Daily.  Marland Kitchen  VITAMIN D    10,000 Units, Take daily.   Marland Kitchen  zinc  50 MG tablet, Take 5 daily.  Problem list He has HTN (hypertension); Hyperlipidemia associated with type 2 diabetes mellitus (HCC); GERD (gastroesophageal reflux disease); Anxiety; Type 2 diabetes mellitus with stage 2 chronic kidney disease, without long-term current use of insulin (HCC); OSA (obstructive sleep apnea); IBS (irritable bowel syndrome); Vitamin D deficiency; Morbid obesity (HCC) - BMI 30+ with sleep apnea; Testosterone deficiency; Former smoker; CKD stage 2 due to type 2 diabetes mellitus (HCC); CAD (coronary artery disease); Aortic atherosclerosis (HCC); and Chronic left-sided low back pain with left-sided sciatica on their problem list.   Observations/Objective:  There were no vitals taken for this visit.  HEENT - WNL. Neck - supple.  Chest - Clear equal BS. Cor - Nl HS. RRR w/o sig MGR. PP 1(+). No edema. MS- FROM w/o deformities.  Gait - sl limp. (+) tender trigger point of the calcaneal insertion of the plantar fascia Neuro -  Nl w/o focal abnormalities.  Assessment and Plan:  1. Plantar fasciitis, left  - dexamethasone (DECADRON) 4 MG tablet; Take 1 tab 3 x day - 3 days, then 2 x day - 3 days, then 1  tab daily  Dispense: 20 tablet; Refill: 0  - Encouraged to purchase plantar arch supports.   Follow Up Instructions:        I discussed the assessment and treatment plan with the patient. The patient was provided an opportunity to ask questions and all were answered. The patient agreed with the plan and demonstrated an understanding of the instructions.       The patient was advised to call back or seek an in-person evaluation if the symptoms worsen or if the condition fails to improve as anticipated.   Marinus Maw, MD

## 2020-04-20 ENCOUNTER — Ambulatory Visit (INDEPENDENT_AMBULATORY_CARE_PROVIDER_SITE_OTHER): Payer: PPO | Admitting: Internal Medicine

## 2020-04-20 ENCOUNTER — Other Ambulatory Visit: Payer: Self-pay

## 2020-04-20 ENCOUNTER — Encounter: Payer: Self-pay | Admitting: Internal Medicine

## 2020-04-20 VITALS — BP 106/62 | HR 63 | Temp 96.8°F | Resp 16 | Ht 72.0 in | Wt 231.2 lb

## 2020-04-20 DIAGNOSIS — M722 Plantar fascial fibromatosis: Secondary | ICD-10-CM | POA: Diagnosis not present

## 2020-04-20 MED ORDER — DEXAMETHASONE 4 MG PO TABS: ORAL_TABLET | ORAL | 0 refills | Status: DC

## 2020-04-20 NOTE — Patient Instructions (Signed)
Plantar Fasciitis  Plantar fasciitis is a painful foot condition that affects the heel. It occurs when the band of tissue that connects the toes to the heel bone (plantar fascia) becomes irritated. This can happen as the result of exercising too much or doing other repetitive activities (overuse injury). The pain from plantar fasciitis can range from mild irritation to severe pain that makes it difficult to walk or move. The pain is usually worse in the morning after sleeping, or after sitting or lying down for a while. Pain may also be worse after long periods of walking or standing. What are the causes? This condition may be caused by:  Standing for long periods of time.  Wearing shoes that do not have good arch support.  Doing activities that put stress on joints (high-impact activities), including running, aerobics, and ballet.  Being overweight.  An abnormal way of walking (gait).  Tight muscles in the back of your lower leg (calf).  High arches in your feet.  Starting a new athletic activity. What are the signs or symptoms? The main symptom of this condition is heel pain. Pain may:  Be worse with first steps after a time of rest, especially in the morning after sleeping or after you have been sitting or lying down for a while.  Be worse after long periods of standing still.  Decrease after 30-45 minutes of activity, such as gentle walking. How is this diagnosed? This condition may be diagnosed based on your medical history and your symptoms. Your health care provider may ask questions about your activity level. Your health care provider will do a physical exam to check for:  A tender area on the bottom of your foot.  A high arch in your foot.  Pain when you move your foot.  Difficulty moving your foot. You may have imaging tests to confirm the diagnosis, such as:  X-rays.  Ultrasound.  MRI. How is this treated? Treatment for plantar fasciitis depends on how  severe your condition is. Treatment may include:  Rest, ice, applying pressure (compression), and raising the affected foot (elevation). This may be called RICE therapy. Your health care provider may recommend RICE therapy along with over-the-counter pain medicines to manage your pain.  Exercises to stretch your calves and your plantar fascia.  A splint that holds your foot in a stretched, upward position while you sleep (night splint).  Physical therapy to relieve symptoms and prevent problems in the future.  Injections of steroid medicine (cortisone) to relieve pain and inflammation.  Stimulating your plantar fascia with electrical impulses (extracorporeal shock wave therapy). This is usually the last treatment option before surgery.  Surgery, if other treatments have not worked after 12 months. Follow these instructions at home:  Managing pain, stiffness, and swelling  If directed, put ice on the painful area: ? Put ice in a plastic bag, or use a frozen bottle of water. ? Place a towel between your skin and the bag or bottle. ? Roll the bottom of your foot over the bag or bottle. ? Do this for 20 minutes, 2-3 times a day.  Wear athletic shoes that have air-sole or gel-sole cushions, or try wearing soft shoe inserts that are designed for plantar fasciitis.  Raise (elevate) your foot above the level of your heart while you are sitting or lying down. Activity  Avoid activities that cause pain. Ask your health care provider what activities are safe for you.  Do physical therapy exercises and stretches as told   by your health care provider.  Try activities and forms of exercise that are easier on your joints (low-impact). Examples include swimming, water aerobics, and biking. General instructions  Take over-the-counter and prescription medicines only as told by your health care provider.  Wear a night splint while sleeping, if told by your health care provider. Loosen the splint  if your toes tingle, become numb, or turn cold and blue.  Maintain a healthy weight, or work with your health care provider to lose weight as needed.  Keep all follow-up visits as told by your health care provider. This is important. Contact a health care provider if you:  Have symptoms that do not go away after caring for yourself at home.  Have pain that gets worse.  Have pain that affects your ability to move or do your daily activities. Summary  Plantar fasciitis is a painful foot condition that affects the heel. It occurs when the band of tissue that connects the toes to the heel bone (plantar fascia) becomes irritated.  The main symptom of this condition is heel pain that may be worse after exercising too much or standing still for a long time.  Treatment varies, but it usually starts with rest, ice, compression, and elevation (RICE therapy) and over-the-counter medicines to manage pain. This information is not intended to replace advice given to you by your health care provider. Make sure you discuss any questions you have with your health care provider. Document Revised: 04/13/2017 Document Reviewed: 02/26/2017 Elsevier Patient Education  2020 [employer]  Plantar Fasciitis Rehab Ask your health care provider which exercises are safe for you. Do exercises exactly as told by your health care provider and adjust them as directed. It is normal to feel mild stretching, pulling, tightness, or discomfort as you do these exercises. Stop right away if you feel sudden pain or your pain gets worse. Do not begin these exercises until told by your health care provider. Stretching and range-of-motion exercises These exercises warm up your muscles and joints and improve the movement and flexibility of your foot. These exercises also help to relieve pain. Plantar fascia stretch  1. Sit with your left / right leg crossed over your opposite knee. 2. Hold your heel with one hand with that  thumb near your arch. With your other hand, hold your toes and gently pull them back toward the top of your foot. You should feel a stretch on the bottom of your toes or your foot (plantar fascia) or both. 3. Hold this stretch for__________ seconds. 4. Slowly release your toes and return to the starting position. Repeat __________ times. Complete this exercise __________ times a day. Gastrocnemius stretch, standing This exercise is also called a calf (gastroc) stretch. It stretches the muscles in the back of the upper calf. 1. Stand with your hands against a wall. 2. Extend your left / right leg behind you, and bend your front knee slightly. 3. Keeping your heels on the floor and your back knee straight, shift your weight toward the wall. Do not arch your back. You should feel a gentle stretch in your upper left / right calf. 4. Hold this position for __________ seconds. Repeat __________ times. Complete this exercise __________ times a day. Soleus stretch, standing This exercise is also called a calf (soleus) stretch. It stretches the muscles in the back of the lower calf. 1. Stand with your hands against a wall. 2. Extend your left / right leg behind you, and bend your front   knee slightly. 3. Keeping your heels on the floor, bend your back knee and shift your weight slightly over your back leg. You should feel a gentle stretch deep in your lower calf. 4. Hold this position for __________ seconds. Repeat __________ times. Complete this exercise __________ times a day. Gastroc and soleus stretch, standing step This exercise stretches the muscles in the back of the lower leg. These muscles are in the upper calf (gastrocnemius) and the lower calf (soleus). 1. Stand with the ball of your left / right foot on a step. The ball of your foot is on the walking surface, right under your toes. 2. Keep your other foot firmly on the same step. 3. Hold on to the wall or a railing for balance. 4. Slowly  lift your other foot, allowing your body weight to press your left / right heel down over the edge of the step. You should feel a stretch in your left / right calf. 5. Hold this position for __________ seconds. 6. Return both feet to the step. 7. Repeat this exercise with a slight bend in your left / right knee. Repeat __________ times with your left / right knee straight and __________ times with your left / right knee bent. Complete this exercise __________ times a day. Balance exercise This exercise builds your balance and strength control of your arch to help take pressure off your plantar fascia. Single leg stand If this exercise is too easy, you can try it with your eyes closed or while standing on a pillow. 1. Without shoes, stand near a railing or in a doorway. You may hold on to the railing or door frame as needed. 2. Stand on your left / right foot. Keep your big toe down on the floor and try to keep your arch lifted. Do not let your foot roll inward. 3. Hold this position for __________ seconds. Repeat __________ times. Complete this exercise __________ times a day. This information is not intended to replace advice given to you by your health care provider. Make sure you discuss any questions you have with your health care provider. Document Revised: 08/22/2018 Document Reviewed: 02/27/2018 Elsevier Patient Education  2020 [employer]  

## 2020-04-23 ENCOUNTER — Ambulatory Visit: Payer: PPO | Admitting: Internal Medicine

## 2020-04-25 ENCOUNTER — Other Ambulatory Visit: Payer: Self-pay | Admitting: Internal Medicine

## 2020-04-25 DIAGNOSIS — I10 Essential (primary) hypertension: Secondary | ICD-10-CM

## 2020-04-25 DIAGNOSIS — E119 Type 2 diabetes mellitus without complications: Secondary | ICD-10-CM

## 2020-04-25 MED ORDER — ATENOLOL 100 MG PO TABS: ORAL_TABLET | ORAL | 0 refills | Status: DC

## 2020-04-25 MED ORDER — METFORMIN HCL ER 500 MG PO TB24: ORAL_TABLET | ORAL | 0 refills | Status: DC

## 2020-05-17 ENCOUNTER — Other Ambulatory Visit: Payer: Self-pay | Admitting: *Deleted

## 2020-05-17 MED ORDER — CITALOPRAM HYDROBROMIDE 40 MG PO TABS: ORAL_TABLET | ORAL | 1 refills | Status: DC

## 2020-05-18 ENCOUNTER — Encounter: Payer: Self-pay | Admitting: Internal Medicine

## 2020-05-18 NOTE — Patient Instructions (Signed)

## 2020-05-18 NOTE — Progress Notes (Signed)
Annual  Screening/Preventative Visit  & Comprehensive Evaluation & Examination      This very nice 70 y.o. MBM presents for a Screening /Preventative Visit & comprehensive evaluation and management of multiple medical co-morbidities.  Patient has been followed for HTN, HLD, T2_NIDDM  and Vitamin D Deficiency.     HTN predates circa 1989. Patient's BP has been controlled at home.  Today's BP is at goal - 108/68. Patient denies any cardiac symptoms as chest pain, palpitations, shortness of breath, dizziness or ankle swelling.      Patient's hyperlipidemia is controlled with diet and Rosuvastatin. Patient denies myalgias or other medication SE's. Last lipids were at goal:  Lab Results  Component Value Date   CHOL 134 01/09/2020   HDL 42 01/09/2020   LDLCALC 77 01/09/2020   TRIG 73 01/09/2020   CHOLHDL 3.2 01/09/2020        Patient has hx/o  moderate obesity (BMI 31+) and consequent T2_NIDDM (2007)  w/CKD1  and patient denies reactive hypoglycemic symptoms, visual blurring, diabetic polys or paresthesias. Last A1c was not at goal:   Lab Results  Component Value Date   HGBA1C 8.0 (H) 01/09/2020                                            Patient has hx/o Low Testosterone ("209" /2015 and "154" /2016) and he declined treatment.      Finally, patient has history of Vitamin D Deficiency ("22" /2008 and "34" /2019)and last vitamin D was at goal:   Lab Results  Component Value Date   VD25OH 87 01/09/2020    Current Outpatient Medications on File Prior to Visit  Medication Sig  . aspirin 81 MG tablet Take  daily.  Marland Kitchen atenolol 100 MG tablet Take 1 tablet     Daily  . citalopram  40 MG  Take 1 tablet Daily  . losartan-hctz  100-25 MG  Take     1 tablet    Daily       . Magnesium 400 MG CAPS Takes 2 capsules Daily  . metFORMIN-XR 500 MG Take 2 tablets     2 x /day   . montelukast  10 MG tablet Take 1 tablet Daily for Allergies  . pantoprazole  40 MG tablet Take  daily.   .  Probiotic Product  Take daily.  . rosuvastatin  20 MG tablet TAKE ONE TABLET  DAILY  . VITAMIN D  Take 10,000 Units  daily.   Marland Kitchen zinc50 MG tablet Take  daily.    No Known Allergies  Past Medical History:  Diagnosis Date  . Allergy   . Anxiety    On Xanax  . Chest pain   . Fatigue   . GERD (gastroesophageal reflux disease)   . Heart murmur    hx of MVP   . HTN (hypertension)   . Hyperlipemia   . IBS (irritable bowel syndrome)   . Medication management 06/13/2013  . Mitral valve prolapse   . Obesity (BMI 30.0-34.9) 08/20/2015  . OSA (obstructive sleep apnea)    Restated CPAP  . Sleep apnea    no cpap   . T2_NIDDM   . Testosterone deficiency 08/20/2015  . Type II or unspecified type diabetes mellitus without mention of complication, not stated as uncontrolled   . Vitamin D deficiency    Health Maintenance  Topic  Date Due  . COVID-19 Vaccine (1) Never done  . OPHTHALMOLOGY EXAM  09/02/2019  . FOOT EXAM  04/22/2020  . HEMOGLOBIN A1C  07/11/2020  . COLONOSCOPY (Pts 45-76yrs Insurance coverage will need to be confirmed)  03/30/2022  . TETANUS/TDAP  01/31/2024  . INFLUENZA VACCINE  Completed  . Hepatitis C Screening  Completed  . PNA vac Low Risk Adult  Completed   Immunization History  Administered Date(s) Administered  . Influenza Inj Mdck Quad With Preservative 01/29/2017  . Influenza Split 03/01/2015  . Influenza, High Dose Seasonal PF 03/19/2018, 04/23/2019, 03/08/2020  . Influenza,inj,quad, With Preservative 03/03/2016  . PPD Test 01/30/2014, 01/29/2017, 03/19/2018  . Pneumococcal Conjugate-13 03/19/2018  . Pneumococcal Polysaccharide-23 05/21/2008, 04/23/2019  . Td 10/30/2003  . Tdap 01/30/2014   Last Colon - 03/30/2017 - Dr Christella Hartigan >(-) Colonoscopy /(+) FHx Colon Ca Father /Recc 5 yr f./u Colon in Nov 2023  Past Surgical History:  Procedure Laterality Date  . COLONOSCOPY    . TONSILLECTOMY    . WISDOM TOOTH EXTRACTION     Family History  Problem Relation Age  of Onset  . Stroke Other   . Hypertension Other   . Hyperlipidemia Other   . Diabetes Other   . Colon cancer Father 79  . Hypertension Father   . Dementia Mother   . Alzheimer's disease Mother   . Colon polyps Neg Hx   . Esophageal cancer Neg Hx   . Rectal cancer Neg Hx   . Stomach cancer Neg Hx    Social History   Socioeconomic History  . Marital status: Married    Spouse name: Jasmine December  . Number of children: None  Tobacco Use  . Smoking status: Former Smoker    Packs/day: 1.00    Years: 19.00    Pack years: 19.00    Types: Cigarettes    Start date: 34    Quit date: 09/06/2007    Years since quitting: 12.7  . Smokeless tobacco: Never Used  Vaping Use  . Vaping Use: Never used  Substance and Sexual Activity  . Alcohol use: Yes    Comment: occasional  . Drug use: No  . Sexual activity: Not on file    ROS Constitutional: Denies fever, chills, weight loss/gain, headaches, insomnia,  night sweats or change in appetite. Does c/o fatigue. Eyes: Denies redness, blurred vision, diplopia, discharge, itchy or watery eyes.  ENT: Denies discharge, congestion, post nasal drip, epistaxis, sore throat, earache, hearing loss, dental pain, Tinnitus, Vertigo, Sinus pain or snoring.  Cardio: Denies chest pain, palpitations, irregular heartbeat, syncope, dyspnea, diaphoresis, orthopnea, PND, claudication or edema Respiratory: denies cough, dyspnea, DOE, pleurisy, hoarseness, laryngitis or wheezing.  Gastrointestinal: Denies dysphagia, heartburn, reflux, water brash, pain, cramps, nausea, vomiting, bloating, diarrhea, constipation, hematemesis, melena, hematochezia, jaundice or hemorrhoids Genitourinary: Denies dysuria, frequency, discharge, hematuria or flank pain. Has urgency, nocturia x 2-3 & occasional hesitancy. Musculoskeletal: Denies arthralgia, myalgia, stiffness, Jt. Swelling, pain, limp or strain/sprain. Denies Falls. Skin: Denies puritis, rash, hives, warts, acne, eczema or change  in skin lesion Neuro: No weakness, tremor, incoordination, spasms, paresthesia or pain Psychiatric: Denies confusion, memory loss or sensory loss. Denies Depression. Endocrine: Denies change in weight, skin, hair change, nocturia, and paresthesia, diabetic polys, visual blurring or hyper / hypo glycemic episodes.  Heme/Lymph: No excessive bleeding, bruising or enlarged lymph nodes.  Physical Exam  BP 108/68   Pulse 71   Temp (!) 97 F (36.1 C)   Resp 16   Ht 6' (  1.829 m)   Wt 233 lb (105.7 kg)   SpO2 97%   BMI 31.60 kg/m   General Appearance: Well nourished and well groomed and in no apparent distress.  Eyes: PERRLA, EOMs, conjunctiva no swelling or erythema, normal fundi and vessels. Sinuses: No frontal/maxillary tenderness ENT/Mouth: EACs patent / TMs  nl. Nares clear without erythema, swelling, mucoid exudates. Oral hygiene is good. No erythema, swelling, or exudate. Tongue normal, non-obstructing. Tonsils not swollen or erythematous. Hearing normal.  Neck: Supple, thyroid not palpable. No bruits, nodes or JVD. Respiratory: Respiratory effort normal.  BS equal and clear bilateral without rales, rhonci, wheezing or stridor. Cardio: Heart sounds are normal with regular rate and rhythm and no murmurs, rubs or gallops. Peripheral pulses are normal and equal bilaterally without edema. No aortic or femoral bruits. Chest: symmetric with normal excursions and percussion.  Abdomen: Soft, with Nl bowel sounds. Nontender, no guarding, rebound, hernias, masses, or organomegaly.  Lymphatics: Non tender without lymphadenopathy.  Musculoskeletal: Full ROM all peripheral extremities, joint stability, 5/5 strength, and normal gait. Skin: Warm and dry without rashes, lesions, cyanosis, clubbing or  ecchymosis.  Neuro: Cranial nerves intact, reflexes equal bilaterally. Normal muscle tone, no cerebellar symptoms. Sensation intact.  Pysch: Alert and oriented X 3 with normal affect, insight and  judgment appropriate.   Assessment and Plan  1. Annual Preventative/Screening Exam    2. Essential hypertension  - EKG 12-Lead - [place], RETROPERITNL ABD,  LTD - Urinalysis, Routine w reflex microscopic - Microalbumin / creatinine urine ratio - CBC with Differential/Platelet - COMPLETE METABOLIC PANEL WITH GFR - Magnesium - TSH  3. Hyperlipidemia associated with type 2 diabetes mellitus ([place])  - EKG 12-Lead - [place], RETROPERITNL ABD,  LTD - Lipid panel - TSH  4. Type 2 diabetes mellitus with stage 2 chronic kidney disease, without long-term current use of insulin (HCC)  - EKG 12-Lead - [place], RETROPERITNL ABD,  LTD - Urinalysis, Routine w reflex microscopic - Microalbumin / creatinine urine ratio - Hemoglobin A1c  5. Vitamin D deficiency  - VITAMIN D 25 Hydroxy  6. Coronary artery disease involving native coronary artery of native heart without angina pectoris  - EKG 12-Lead  7. OSA (obstructive sleep apnea)   8. Aortic atherosclerosis ([place]) by Chest CT scan 06.23.2020  - EKG 12-Lead - [place], RETROPERITNL ABD,  LTD  9. BPH with obstruction/lower urinary tract symptoms  - PSA  10. Prostate cancer screening  - PSA  11. Screening for colorectal cancer  - POC Hemoccult Bld/Stl  12. Screening for ischemic heart disease  - EKG 12-Lead  13. Former smoker   61. Screening for AAA (aortic abdominal aneurysm)  - [place], RETROPERITNL ABD,  LTD  15. Medication management  - Urinalysis, Routine w reflex microscopic - Microalbumin / creatinine urine ratio - CBC with Differential/Platelet - COMPLETE METABOLIC PANEL WITH GFR - Magnesium - Lipid panel - TSH - Hemoglobin A1c - Insulin, random - VITAMIN D 25 Hydroxy         Patient was counseled in prudent diet, weight control to achieve/maintain BMI less than 25, BP monitoring, regular exercise and medications as discussed.  Discussed med effects and SE's. Routine screening labs and tests as requested with regular  follow-up as recommended. Over 40 minutes of exam, counseling, chart review and high complex critical decision making was performed   Kirtland Bouchard, MD

## 2020-05-18 NOTE — Progress Notes (Incomplete)
Annual  Screening/Preventative Visit  & Comprehensive Evaluation & Examination      This very nice 70 y.o. MBM presents for a Screening /Preventative Visit & comprehensive evaluation and management of multiple medical co-morbidities.  Patient has been followed for HTN, HLD, T2_NIDDM  and Vitamin D Deficiency.     HTN predates circa 1989. Patient's BP has been controlled at home.  Today's  . Patient denies any cardiac symptoms as chest pain, palpitations, shortness of breath, dizziness or ankle swelling.      Patient's hyperlipidemia is controlled with diet and Rosuvastatin. Patient denies myalgias or other medication SE's. Last lipids were at goal:  Lab Results  Component Value Date   CHOL 134 01/09/2020   HDL 42 01/09/2020   LDLCALC 77 01/09/2020   TRIG 73 01/09/2020   CHOLHDL 3.2 01/09/2020        Patient has hx/o  moderate obesity (BMI 31+) a nd consequent T2_NIDDM (2007)  w/CKD1 since    and patient denies reactive hypoglycemic symptoms, visual blurring, diabetic polys or paresthesias. Last A1c was not at goal:   Lab Results  Component Value Date   HGBA1C 8.0 (H) 01/09/2020                                            Patient has hx/o Low Testosterone ("209" /2015 and "154" /2016) and he declined treatment.      Finally, patient has history of Vitamin D Deficiency ("22" /2008 and ""34" /2019)and last vitamin D was at goal:   Lab Results  Component Value Date   VD25OH 87 01/09/2020    Current Outpatient Medications on File Prior to Visit  Medication Sig  . aspirin 81 MG tablet Take 81 mg by mouth daily.  Marland Kitchen atenolol (TENORMIN) 100 MG tablet Take 1 tablet     Daily     for BP  . citalopram (CELEXA) 40 MG tablet Take 1 tablet Daily for Mood  . Continuous Blood Gluc Sensor (FREESTYLE LIBRE 14 DAY SENSOR) MISC USE TO CHECK BLOOD SUGARS THREE TIMES A DAY AS DIRECTED  . glucose blood (ONETOUCH VERIO) test strip Check blood sugar 1 time a day-[id number].  .  losartan-hydrochlorothiazide (HYZAAR) 100-25 MG tablet Take     1 tablet    Daily     for BP & Fluid Retention / Ankle Swelling  . Magnesium 400 MG CAPS Take 1 capsuile Daily  . metFORMIN (GLUCOPHAGE-XR) 500 MG 24 hr tablet Take 2 tablets     2 x /day    With Meals for Diabetes  . montelukast (SINGULAIR) 10 MG tablet Take 1 tablet Daily for Allergies  . pantoprazole (PROTONIX) 40 MG tablet Take 40 mg by mouth daily.   . Probiotic Product (PROBIOTIC DAILY PO) Take by mouth daily.  . rosuvastatin (CRESTOR) 20 MG tablet TAKE ONE TABLET BY MOUTH DAILY FOR CHOLESTEROL  . VITAMIN D PO Take 10,000 Units by mouth daily.   Marland Kitchen zinc gluconate 50 MG tablet Take 50 mg by mouth daily.   No current facility-administered medications on file prior to visit.    No Known Allergies  Past Medical History:  Diagnosis Date  . Allergy   . Anxiety    On Xanax  . Chest pain   . Fatigue   . GERD (gastroesophageal reflux disease)   . Heart murmur    hx of MVP   .  HTN (hypertension)   . Hyperlipemia   . IBS (irritable bowel syndrome)   . Medication management 06/13/2013  . Mitral valve prolapse   . Obesity (BMI 30.0-34.9) 08/20/2015  . OSA (obstructive sleep apnea)    Restated CPAP  . Sleep apnea    no cpap   . T2_NIDDM   . Testosterone deficiency 08/20/2015  . Type II or unspecified type diabetes mellitus without mention of complication, not stated as uncontrolled   . Vitamin D deficiency    Health Maintenance  Topic Date Due  . COVID-19 Vaccine (1) Never done  . OPHTHALMOLOGY EXAM  09/02/2019  . FOOT EXAM  04/22/2020  . HEMOGLOBIN A1C  07/11/2020  . COLONOSCOPY (Pts 45-58yrs Insurance coverage will need to be confirmed)  03/30/2022  . TETANUS/TDAP  01/31/2024  . INFLUENZA VACCINE  Completed  . Hepatitis C Screening  Completed  . PNA vac Low Risk Adult  Completed   Immunization History  Administered Date(s) Administered  . Influenza Inj Mdck Quad With Preservative 01/29/2017  . Influenza Split  03/01/2015  . Influenza, High Dose Seasonal PF 03/19/2018, 04/23/2019, 03/08/2020  . Influenza,inj,quad, With Preservative 03/03/2016  . PPD Test 01/30/2014, 01/29/2017, 03/19/2018  . Pneumococcal Conjugate-13 03/19/2018  . Pneumococcal Polysaccharide-23 05/21/2008, 04/23/2019  . Td 10/30/2003  . Tdap 01/30/2014   Last Colon -   Past Surgical History:  Procedure Laterality Date  . COLONOSCOPY    . TONSILLECTOMY    . WISDOM TOOTH EXTRACTION     Family History  Problem Relation Age of Onset  . Stroke Other   . Hypertension Other   . Hyperlipidemia Other   . Diabetes Other   . Colon cancer Father 67  . Hypertension Father   . Dementia Mother   . Alzheimer's disease Mother   . Colon polyps Neg Hx   . Esophageal cancer Neg Hx   . Rectal cancer Neg Hx   . Stomach cancer Neg Hx    Social History   Socioeconomic History  . Marital status: Married    Spouse name: Jasmine December  . Number of children: None  Occupational History  . Not on file  Tobacco Use  . Smoking status: Former Smoker    Packs/day: 1.00    Years: 19.00    Pack years: 19.00    Types: Cigarettes    Start date: 77    Quit date: 09/06/2007    Years since quitting: 12.7  . Smokeless tobacco: Never Used  Vaping Use  . Vaping Use: Never used  Substance and Sexual Activity  . Alcohol use: Yes    Comment: occasional  . Drug use: No  . Sexual activity: Not on file    ROS Constitutional: Denies fever, chills, weight loss/gain, headaches, insomnia,  night sweats or change in appetite. Does c/o fatigue. Eyes: Denies redness, blurred vision, diplopia, discharge, itchy or watery eyes.  ENT: Denies discharge, congestion, post nasal drip, epistaxis, sore throat, earache, hearing loss, dental pain, Tinnitus, Vertigo, Sinus pain or snoring.  Cardio: Denies chest pain, palpitations, irregular heartbeat, syncope, dyspnea, diaphoresis, orthopnea, PND, claudication or edema Respiratory: denies cough, dyspnea, DOE, pleurisy,  hoarseness, laryngitis or wheezing.  Gastrointestinal: Denies dysphagia, heartburn, reflux, water brash, pain, cramps, nausea, vomiting, bloating, diarrhea, constipation, hematemesis, melena, hematochezia, jaundice or hemorrhoids Genitourinary: Denies dysuria, frequency, urgency, nocturia, hesitancy, discharge, hematuria or flank pain Musculoskeletal: Denies arthralgia, myalgia, stiffness, Jt. Swelling, pain, limp or strain/sprain. Denies Falls. Skin: Denies puritis, rash, hives, warts, acne, eczema or change in skin lesion  Neuro: No weakness, tremor, incoordination, spasms, paresthesia or pain Psychiatric: Denies confusion, memory loss or sensory loss. Denies Depression. Endocrine: Denies change in weight, skin, hair change, nocturia, and paresthesia, diabetic polys, visual blurring or hyper / hypo glycemic episodes.  Heme/Lymph: No excessive bleeding, bruising or enlarged lymph nodes.  Physical Exam  There were no vitals taken for this visit.  General Appearance: Well nourished and well groomed and in no apparent distress.  Eyes: PERRLA, EOMs, conjunctiva no swelling or erythema, normal fundi and vessels. Sinuses: No frontal/maxillary tenderness ENT/Mouth: EACs patent / TMs  nl. Nares clear without erythema, swelling, mucoid exudates. Oral hygiene is good. No erythema, swelling, or exudate. Tongue normal, non-obstructing. Tonsils not swollen or erythematous. Hearing normal.  Neck: Supple, thyroid not palpable. No bruits, nodes or JVD. Respiratory: Respiratory effort normal.  BS equal and clear bilateral without rales, rhonci, wheezing or stridor. Cardio: Heart sounds are normal with regular rate and rhythm and no murmurs, rubs or gallops. Peripheral pulses are normal and equal bilaterally without edema. No aortic or femoral bruits. Chest: symmetric with normal excursions and percussion.  Abdomen: Soft, with Nl bowel sounds. Nontender, no guarding, rebound, hernias, masses, or organomegaly.   Lymphatics: Non tender without lymphadenopathy.  Musculoskeletal: Full ROM all peripheral extremities, joint stability, 5/5 strength, and normal gait. Skin: Warm and dry without rashes, lesions, cyanosis, clubbing or  ecchymosis.  Neuro: Cranial nerves intact, reflexes equal bilaterally. Normal muscle tone, no cerebellar symptoms. Sensation intact.  Pysch: Alert and oriented X 3 with normal affect, insight and judgment appropriate.   Assessment and Plan  1. Annual Preventative/Screening Exam             Patient was counseled in prudent diet, weight control to achieve/maintain BMI less than 25, BP monitoring, regular exercise and medications as discussed.  Discussed med effects and SE's. Routine screening labs and tests as requested with regular follow-up as recommended. Over 40 minutes of exam, counseling, chart review and high complex critical decision making was performed   Kirtland Bouchard, MD

## 2020-05-19 ENCOUNTER — Other Ambulatory Visit: Payer: Self-pay

## 2020-05-19 ENCOUNTER — Ambulatory Visit (INDEPENDENT_AMBULATORY_CARE_PROVIDER_SITE_OTHER): Payer: PPO | Admitting: Internal Medicine

## 2020-05-19 VITALS — BP 108/68 | HR 71 | Temp 97.0°F | Resp 16 | Ht 72.0 in | Wt 233.0 lb

## 2020-05-19 DIAGNOSIS — E559 Vitamin D deficiency, unspecified: Secondary | ICD-10-CM | POA: Diagnosis not present

## 2020-05-19 DIAGNOSIS — N138 Other obstructive and reflux uropathy: Secondary | ICD-10-CM

## 2020-05-19 DIAGNOSIS — I10 Essential (primary) hypertension: Secondary | ICD-10-CM | POA: Diagnosis not present

## 2020-05-19 DIAGNOSIS — Z1211 Encounter for screening for malignant neoplasm of colon: Secondary | ICD-10-CM

## 2020-05-19 DIAGNOSIS — Z87891 Personal history of nicotine dependence: Secondary | ICD-10-CM

## 2020-05-19 DIAGNOSIS — Z0000 Encounter for general adult medical examination without abnormal findings: Secondary | ICD-10-CM | POA: Diagnosis not present

## 2020-05-19 DIAGNOSIS — E785 Hyperlipidemia, unspecified: Secondary | ICD-10-CM

## 2020-05-19 DIAGNOSIS — Z0001 Encounter for general adult medical examination with abnormal findings: Secondary | ICD-10-CM

## 2020-05-19 DIAGNOSIS — E1122 Type 2 diabetes mellitus with diabetic chronic kidney disease: Secondary | ICD-10-CM | POA: Diagnosis not present

## 2020-05-19 DIAGNOSIS — Z79899 Other long term (current) drug therapy: Secondary | ICD-10-CM | POA: Diagnosis not present

## 2020-05-19 DIAGNOSIS — Z125 Encounter for screening for malignant neoplasm of prostate: Secondary | ICD-10-CM

## 2020-05-19 DIAGNOSIS — N401 Enlarged prostate with lower urinary tract symptoms: Secondary | ICD-10-CM | POA: Diagnosis not present

## 2020-05-19 DIAGNOSIS — E1169 Type 2 diabetes mellitus with other specified complication: Secondary | ICD-10-CM

## 2020-05-19 DIAGNOSIS — I700 Atherosclerosis of aorta: Secondary | ICD-10-CM | POA: Diagnosis not present

## 2020-05-19 DIAGNOSIS — Z1212 Encounter for screening for malignant neoplasm of rectum: Secondary | ICD-10-CM

## 2020-05-19 DIAGNOSIS — Z136 Encounter for screening for cardiovascular disorders: Secondary | ICD-10-CM

## 2020-05-19 DIAGNOSIS — G4733 Obstructive sleep apnea (adult) (pediatric): Secondary | ICD-10-CM

## 2020-05-19 DIAGNOSIS — N182 Chronic kidney disease, stage 2 (mild): Secondary | ICD-10-CM | POA: Diagnosis not present

## 2020-05-19 DIAGNOSIS — I2510 Atherosclerotic heart disease of native coronary artery without angina pectoris: Secondary | ICD-10-CM

## 2020-05-20 LAB — CBC WITH DIFFERENTIAL/PLATELET
Absolute Monocytes: 416 cells/uL (ref 200–950)
Basophils Absolute: 68 cells/uL (ref 0–200)
Basophils Relative: 1.2 %
Eosinophils Absolute: 228 cells/uL (ref 15–500)
Eosinophils Relative: 4 %
HCT: 37.5 % — ABNORMAL LOW (ref 38.5–50.0)
Hemoglobin: 12.8 g/dL — ABNORMAL LOW (ref 13.2–17.1)
Lymphs Abs: 3238 cells/uL (ref 850–3900)
MCH: 31.2 pg (ref 27.0–33.0)
MCHC: 34.1 g/dL (ref 32.0–36.0)
MCV: 91.5 fL (ref 80.0–100.0)
MPV: 10.8 fL (ref 7.5–12.5)
Monocytes Relative: 7.3 %
Neutro Abs: 1750 cells/uL (ref 1500–7800)
Neutrophils Relative %: 30.7 %
Platelets: 278 10*3/uL (ref 140–400)
RBC: 4.1 10*6/uL — ABNORMAL LOW (ref 4.20–5.80)
RDW: 12.3 % (ref 11.0–15.0)
Total Lymphocyte: 56.8 %
WBC: 5.7 10*3/uL (ref 3.8–10.8)

## 2020-05-20 LAB — HEMOGLOBIN A1C
Hgb A1c MFr Bld: 8.1 % of total Hgb — ABNORMAL HIGH (ref <5.7)
Mean Plasma Glucose: 186 mg/dL
eAG (mmol/L): 10.3 mmol/L

## 2020-05-20 LAB — URINALYSIS, ROUTINE W REFLEX MICROSCOPIC
Bilirubin Urine: NEGATIVE
Glucose, UA: NEGATIVE
Hgb urine dipstick: NEGATIVE
Ketones, ur: NEGATIVE
Leukocytes,Ua: NEGATIVE
Nitrite: NEGATIVE
Protein, ur: NEGATIVE
Specific Gravity, Urine: 1.024 (ref 1.001–1.03)
pH: <=5 (ref 5.0–8.0)

## 2020-05-20 LAB — COMPLETE METABOLIC PANEL WITH GFR
AG Ratio: 1.4 (calc) (ref 1.0–2.5)
ALT: 18 U/L (ref 9–46)
AST: 19 U/L (ref 10–35)
Albumin: 4.3 g/dL (ref 3.6–5.1)
Alkaline phosphatase (APISO): 54 U/L (ref 35–144)
BUN: 19 mg/dL (ref 7–25)
CO2: 29 mmol/L (ref 20–32)
Calcium: 10.2 mg/dL (ref 8.6–10.3)
Chloride: 102 mmol/L (ref 98–110)
Creat: 0.83 mg/dL (ref 0.70–1.25)
GFR, Est African American: 105 mL/min/{1.73_m2} (ref >=60)
GFR, Est Non African American: 90 mL/min/{1.73_m2} (ref >=60)
Globulin: 3 g/dL (calc) (ref 1.9–3.7)
Glucose, Bld: 134 mg/dL — ABNORMAL HIGH (ref 65–99)
Potassium: 4.1 mmol/L (ref 3.5–5.3)
Sodium: 140 mmol/L (ref 135–146)
Total Bilirubin: 0.5 mg/dL (ref 0.2–1.2)
Total Protein: 7.3 g/dL (ref 6.1–8.1)

## 2020-05-20 LAB — TSH: TSH: 1.6 mIU/L (ref 0.40–4.50)

## 2020-05-20 LAB — INSULIN, RANDOM: Insulin: 27.1 u[IU]/mL — ABNORMAL HIGH

## 2020-05-20 LAB — MAGNESIUM: Magnesium: 1.8 mg/dL (ref 1.5–2.5)

## 2020-05-20 LAB — LIPID PANEL
Cholesterol: 124 mg/dL (ref <200)
HDL: 38 mg/dL — ABNORMAL LOW (ref >=40)
LDL Cholesterol (Calc): 69 mg/dL (calc)
Non-HDL Cholesterol (Calc): 86 mg/dL (calc) (ref <130)
Total CHOL/HDL Ratio: 3.3 (calc) (ref <5.0)
Triglycerides: 90 mg/dL (ref <150)

## 2020-05-20 LAB — PSA: PSA: 0.95 ng/mL (ref <=4.0)

## 2020-05-20 LAB — MICROALBUMIN / CREATININE URINE RATIO
Creatinine, Urine: 147 mg/dL (ref 20–320)
Microalb Creat Ratio: 5 mcg/mg creat (ref <30)
Microalb, Ur: 0.7 mg/dL

## 2020-05-20 LAB — VITAMIN D 25 HYDROXY (VIT D DEFICIENCY, FRACTURES): Vit D, 25-Hydroxy: 95 ng/mL (ref 30–100)

## 2020-05-20 NOTE — Progress Notes (Signed)
========================================================== - Test results slightly outside the reference range are not unusual. If there is anything important, I will review this with you,  otherwise it is considered normal test values.  If you have further questions,  please do not hesitate to contact me at the office or via My Chart.  ==========================================================  -  PSA - Very Low - Great ! ==========================================================  -   Magnesium  -   1.8   -  very  low- goal is betw 2.0 - 2.5,   - So..............Marland Kitchen  Recommend that you take  Magnesium 500 mg tablet daily   - also important to eat lots of  leafy green vegetables   - spinach - Kale - collards - greens - okra - asparagus  - broccoli - quinoa - squash - almonds   - black, red, white beans  -  peas - green beans ==========================================================  -  Total Chol = 124      and        LDL Chol = 69  -        Both  Excellent   - Very low risk for Heart Attack  / Stroke ==========================================================  - A1c = 8.1% - About the same - Way too high !  (Ideal or Goal is less than 5.7 %   !  )   -  Being diabetic has a  300% increased risk for heart attack,  stroke, cancer, and alzheimer- type vascular dementia.    -  It is very important that you work harder with diet by  avoiding all foods that are white except chicken,   fish & calliflower.  - Avoid white rice  (brown & wild rice is OK),   - Avoid white potatoes  (sweet potatoes in moderation is OK),   White bread or wheat bread or anything made out of   white flour like bagels, donuts, rolls, buns, biscuits, cakes,  - pastries, cookies, pizza crust, and pasta (made from  white flour & egg whites)   - vegetarian pasta or spinach or wheat pasta is OK.  - Multigrain breads like Arnold's, Pepperidge Farm or   multigrain sandwich thins or high fiber breads  like   [place] bread or "Dave's Killer" breads that are  4 to 5 grams fiber per slice !  are best.    -  Diet, exercise and weight loss can reverse and cure  diabetes in the early stages.    - Diet, exercise and weight loss is very important in the   control and prevention of complications of diabetes which  affects every system in your body, ie.   -Brain - dementia/stroke,  - eyes - glaucoma/blindness,  - heart - heart attack/heart failure,  - kidneys - dialysis,  - stomach - gastric paralysis,  - intestines - malabsorption,  - nerves - severe painful neuritis,  - circulation - gangrene & loss of a leg(s)  - and finally  . . . . . . . . . . . . . . . . . .    - cancer and Alzheimers. ========================================================== ==========================================================  -  Vitamin D = 95 - Excellent - Please keep dose same  - Vitamin D goal is between 70-100.   - It is very important as a natural anti-inflammatory and helping the  immune system protect against viral infections, like the Covid-19   helping hair, skin, and nails, as well as reducing stroke and  heart attack risk.   -  It helps your bones and helps with mood.  - It also decreases numerous cancer risks so please  take it as directed.   - Low Vit D is associated with a 200-300% higher risk for  CANCER   and 200-300% higher risk for HEART   ATTACK  &  STROKE.    - It is also associated with higher death rate at younger ages,   autoimmune diseases like Rheumatoid arthritis, Lupus,  Multiple Sclerosis.     - Also many other serious conditions, like depression, Alzheimer's  Dementia, infertility, muscle aches, fatigue, fibromyalgia   - just to name a few. ==========================================================  - All Else - CBC - Kidneys - Electrolytes - Liver - Magnesium & Thyroid    - all  Normal / OK ===========================================================

## 2020-05-25 ENCOUNTER — Other Ambulatory Visit: Payer: Self-pay | Admitting: Internal Medicine

## 2020-05-25 DIAGNOSIS — I10 Essential (primary) hypertension: Secondary | ICD-10-CM

## 2020-05-25 MED ORDER — LOSARTAN POTASSIUM-HCTZ 100-25 MG PO TABS
ORAL_TABLET | ORAL | 0 refills | Status: DC
Start: 2020-05-25 — End: 2020-09-06

## 2020-07-16 ENCOUNTER — Other Ambulatory Visit: Payer: Self-pay | Admitting: Adult Health

## 2020-07-16 ENCOUNTER — Other Ambulatory Visit: Payer: Self-pay | Admitting: Internal Medicine

## 2020-07-16 DIAGNOSIS — E119 Type 2 diabetes mellitus without complications: Secondary | ICD-10-CM

## 2020-07-16 DIAGNOSIS — I10 Essential (primary) hypertension: Secondary | ICD-10-CM

## 2020-07-16 DIAGNOSIS — E782 Mixed hyperlipidemia: Secondary | ICD-10-CM

## 2020-07-23 ENCOUNTER — Other Ambulatory Visit: Payer: Self-pay | Admitting: Internal Medicine

## 2020-07-23 DIAGNOSIS — E119 Type 2 diabetes mellitus without complications: Secondary | ICD-10-CM

## 2020-07-23 DIAGNOSIS — I10 Essential (primary) hypertension: Secondary | ICD-10-CM

## 2020-07-23 MED ORDER — METFORMIN HCL ER 500 MG PO TB24: ORAL_TABLET | ORAL | 1 refills | Status: DC

## 2020-07-23 MED ORDER — ATENOLOL 100 MG PO TABS
ORAL_TABLET | ORAL | 1 refills | Status: DC
Start: 2020-07-23 — End: 2021-05-12

## 2020-07-27 ENCOUNTER — Ambulatory Visit: Payer: PPO

## 2020-07-28 ENCOUNTER — Other Ambulatory Visit: Payer: Self-pay

## 2020-07-28 ENCOUNTER — Ambulatory Visit (INDEPENDENT_AMBULATORY_CARE_PROVIDER_SITE_OTHER): Payer: PPO

## 2020-07-28 DIAGNOSIS — Z111 Encounter for screening for respiratory tuberculosis: Secondary | ICD-10-CM

## 2020-07-28 NOTE — Progress Notes (Signed)
Patient presents to the office to have PPD placed. Needs this for employment. Tolerated well without any complications. Will return in 72 hours for the reading.

## 2020-08-06 ENCOUNTER — Other Ambulatory Visit: Payer: Self-pay

## 2020-08-06 ENCOUNTER — Ambulatory Visit (INDEPENDENT_AMBULATORY_CARE_PROVIDER_SITE_OTHER): Payer: PPO

## 2020-08-06 DIAGNOSIS — Z111 Encounter for screening for respiratory tuberculosis: Secondary | ICD-10-CM

## 2020-08-06 NOTE — Progress Notes (Signed)
Patient presents to the office for a TB test. Patient was given the choice to get the PPD injection of do the Quantiferon Gold TB lab test. Patient chose to proceed with the lab test. Patient will be notified with the results via MyChart.

## 2020-08-08 LAB — QUANTIFERON-TB GOLD PLUS
Mitogen-NIL: >10 IU/mL
NIL: 0.08 IU/mL
QuantiFERON-TB Gold Plus: NEGATIVE
TB1-NIL: 0.05 IU/mL
TB2-NIL: <0 IU/mL

## 2020-08-10 DIAGNOSIS — M5416 Radiculopathy, lumbar region: Secondary | ICD-10-CM | POA: Diagnosis not present

## 2020-08-10 DIAGNOSIS — M545 Low back pain, unspecified: Secondary | ICD-10-CM | POA: Diagnosis not present

## 2020-08-10 DIAGNOSIS — I10 Essential (primary) hypertension: Secondary | ICD-10-CM | POA: Diagnosis not present

## 2020-08-10 DIAGNOSIS — Z6831 Body mass index (BMI) 31.0-31.9, adult: Secondary | ICD-10-CM | POA: Diagnosis not present

## 2020-08-10 LAB — HM DIABETES EYE EXAM

## 2020-08-18 ENCOUNTER — Other Ambulatory Visit: Payer: Self-pay

## 2020-08-18 ENCOUNTER — Encounter (HOSPITAL_COMMUNITY): Payer: Self-pay

## 2020-08-18 ENCOUNTER — Ambulatory Visit (HOSPITAL_COMMUNITY)
Admission: EM | Admit: 2020-08-18 | Discharge: 2020-08-18 | Disposition: A | Discharge status: Home / Self Care | Payer: PPO | Attending: Family Medicine | Admitting: Family Medicine

## 2020-08-18 DIAGNOSIS — Z9109 Other allergy status, other than to drugs and biological substances: Secondary | ICD-10-CM

## 2020-08-18 DIAGNOSIS — J3089 Other allergic rhinitis: Secondary | ICD-10-CM | POA: Diagnosis not present

## 2020-08-18 DIAGNOSIS — J3489 Other specified disorders of nose and nasal sinuses: Secondary | ICD-10-CM | POA: Diagnosis not present

## 2020-08-18 MED ORDER — MONTELUKAST SODIUM 10 MG PO TABS
ORAL_TABLET | ORAL | 0 refills | Status: DC
Start: 2020-08-18 — End: 2022-04-04

## 2020-08-18 MED ORDER — MUPIROCIN CALCIUM 2 % NA OINT: TOPICAL_OINTMENT | NASAL | 0 refills | Status: DC

## 2020-08-18 MED ORDER — FLUTICASONE PROPIONATE 50 MCG/ACT NA SUSP: 1.0000 | Freq: Two times a day (BID) | NASAL | 2 refills | Status: DC

## 2020-08-18 NOTE — Discharge Instructions (Signed)
Try the neilmed sinus rinse system once to twice daily when your sinuses become congested. Take your home antihistamine daily in addition to the singulair

## 2020-08-18 NOTE — ED Triage Notes (Signed)
Pt in with c/o sinus issues. States he is congested and having pain and pressure behind his right eye  Pt took Excedrin yesterday for sxs

## 2020-08-18 NOTE — ED Provider Notes (Signed)
[facility]    CSN: [id number] Arrival date & time: 08/18/20  0803      History   Chief Complaint Chief Complaint  Patient presents with  . Nasal Congestion  . Facial Pain    HPI Wayne [name] is a 70 y.o. male.   Patient presenting today with 1 day history of right nasal pain and sinus pressure, rhinorrhea, scratchy throat, sinus headache.  He denies chest pain shortness of breath, wheezing, visual changes, fever, chills, body aches, new sick contacts.  Does have a known history of seasonal allergies but not currently taking anything over-the-counter for this aside from some Allegra he just started taking.  He states he has had nasal infections in the past that have become out of control and he was told they can go to his brain if he does not stop them early so he came in as soon as he felt one coming on.     Past Medical History:  Diagnosis Date  . Allergy   . Anxiety    On Xanax  . Chest pain   . Fatigue   . GERD (gastroesophageal reflux disease)   . Heart murmur    hx of MVP   . HTN (hypertension)   . Hyperlipemia   . IBS (irritable bowel syndrome)   . Medication management 06/13/2013  . Mitral valve prolapse   . Obesity (BMI 30.0-34.9) 08/20/2015  . OSA (obstructive sleep apnea)    Restated CPAP  . Sleep apnea    no cpap   . T2_NIDDM   . Testosterone deficiency 08/20/2015  . Type II or unspecified type diabetes mellitus without mention of complication, not stated as uncontrolled   . Vitamin D deficiency     Patient Active Problem List   Diagnosis Date Noted  . Chronic left-sided low back pain with left-sided sciatica 10/03/2019  . CKD stage 2 due to type 2 diabetes mellitus (HCC) 10/02/2019  . CAD (coronary artery disease) 10/02/2019  . Aortic atherosclerosis (HCC) by Chest CT scan 06.23.2020 10/02/2019  . Former smoker 03/19/2018  . Morbid obesity (HCC) - BMI 30+ with sleep apnea 08/20/2015  . Testosterone deficiency 08/20/2015  . Vitamin D  deficiency 06/13/2013  . Type 2 diabetes mellitus with stage 2 chronic kidney disease, without long-term current use of insulin (HCC)   . OSA (obstructive sleep apnea)   . IBS (irritable bowel syndrome)   . HTN (hypertension)   . Hyperlipidemia associated with type 2 diabetes mellitus (HCC)   . GERD (gastroesophageal reflux disease)   . Anxiety     Past Surgical History:  Procedure Laterality Date  . COLONOSCOPY    . TONSILLECTOMY    . WISDOM TOOTH EXTRACTION         Home Medications    Prior to Admission medications   Medication Sig Start Date End Date Taking? Authorizing Provider  fluticasone (FLONASE) 50 MCG/ACT nasal spray Place 1 spray into both nostrils in the morning and at bedtime. Spray upward and outward toward corners of eyes 08/18/20  Yes Particia Nearing, PA-C  mupirocin nasal ointment (BACTROBAN) 2 % Apply in right nostril BID 08/18/20  Yes Particia Nearing, PA-C  aspirin 81 MG tablet Take 81 mg by mouth daily.    [provider]  atenolol (TENORMIN) 100 MG tablet Take  1 tablet  Daily  for BP 07/23/20   Lucky Cowboy, MD  citalopram (CELEXA) 40 MG tablet Take 1 tablet Daily for Mood 05/17/20  Lucky Cowboy, MD  Continuous Blood Gluc Sensor (FREESTYLE LIBRE 14 DAY SENSOR) MISC USE TO CHECK BLOOD SUGARS THREE TIMES A DAY AS DIRECTED 10/10/18   Quentin Mulling R, PA-C  glucose blood [facility] VERIO) test strip Check blood sugar 1 time a day-DX-E11.22. 09/24/17   Lucky Cowboy, MD  losartan-hydrochlorothiazide [facility]) 100-25 MG tablet Take     1 tablet    Daily     for BP & Fluid Retention / Ankle Swelling 05/25/20   Lucky Cowboy, MD  Magnesium 400 MG CAPS Take 1 capsuile Daily Patient taking differently: 500 mg. Takes 2 capsules Daily 01/25/19   Lucky Cowboy, MD  metFORMIN (GLUCOPHAGE-XR) 500 MG 24 hr tablet Take  2 tablets  2 x /day  with Meals  for Diabetes 07/23/20   Lucky Cowboy, MD  montelukast (SINGULAIR) 10 MG tablet Take 1 tablet  Daily for Allergies 08/18/20   Particia Nearing, PA-C  pantoprazole (PROTONIX) 40 MG tablet Take 40 mg by mouth daily.     [provider]  Probiotic Product (PROBIOTIC DAILY PO) Take by mouth daily.    [provider]  rosuvastatin (CRESTOR) 20 MG tablet TAKE ONE TABLET BY MOUTH DAILY FOR CHOLESTEROL 07/16/20   Judd Gaudier, NP  VITAMIN D PO Take 10,000 Units by mouth daily.     [provider]  zinc gluconate 50 MG tablet Take 50 mg by mouth daily.    [provider]    Family History Family History  Problem Relation Age of Onset  . Stroke Other   . Hypertension Other   . Hyperlipidemia Other   . Diabetes Other   . Colon cancer Father 84  . Hypertension Father   . Dementia Mother   . Alzheimer's disease Mother   . Colon polyps Neg Hx   . Esophageal cancer Neg Hx   . Rectal cancer Neg Hx   . Stomach cancer Neg Hx     Social History Social History   Tobacco Use  . Smoking status: Former Smoker    Packs/day: 1.00    Years: 19.00    Pack years: 19.00    Types: Cigarettes    Start date: 44    Quit date: 09/06/2007    Years since quitting: 12.9  . Smokeless tobacco: Never Used  Vaping Use  . Vaping Use: Never used  Substance Use Topics  . Alcohol use: Yes    Comment: occasional  . Drug use: No     Allergies   Patient has no known allergies.   Review of Systems Review of Systems Per HPI Physical Exam Triage Vital Signs ED Triage Vitals  Enc Vitals Group     BP 08/18/20 0818 123/73     Pulse Rate 08/18/20 0818 75     Resp 08/18/20 0818 19     Temp 08/18/20 0818 99 F (37.2 C)     Temp src --      SpO2 08/18/20 0818 99 %     Weight --      Height --      Head Circumference --      Peak Flow --      Pain Score 08/18/20 0817 6     Pain Loc --      Pain Edu? --      Excl. in GC? --    No data found.  Updated Vital Signs BP 123/73   Pulse 75   Temp 99 F (37.2 C)   Resp 19  SpO2 99%   Visual  Acuity Right Eye Distance:   Left Eye Distance:   Bilateral Distance:    Right Eye Near:   Left Eye Near:    Bilateral Near:     Physical Exam Vitals and nursing note reviewed.  Constitutional:      Appearance: Normal appearance.  HENT:     Head: Atraumatic.     Right Ear: Tympanic membrane normal.     Left Ear: Tympanic membrane normal.     Nose: Rhinorrhea present.     Comments: Bilateral nasal turbinates boggy, erythematous    Mouth/Throat:     Mouth: Mucous membranes are moist.     Pharynx: Posterior oropharyngeal erythema present. No oropharyngeal exudate.  Eyes:     Extraocular Movements: Extraocular movements intact.     Conjunctiva/sclera: Conjunctivae normal.  Cardiovascular:     Rate and Rhythm: Normal rate and regular rhythm.  Pulmonary:     Effort: Pulmonary effort is normal.     Breath sounds: Normal breath sounds.  Musculoskeletal:        General: Normal range of motion.     Cervical back: Normal range of motion and neck supple.  Skin:    General: Skin is warm and dry.  Neurological:     General: No focal deficit present.     Mental Status: He is oriented to person, place, and time.  Psychiatric:        Mood and Affect: Mood normal.        Thought Content: Thought content normal.        Judgment: Judgment normal.      UC Treatments / Results  Labs (all labs ordered are listed, but only abnormal results are displayed) Labs Reviewed - No data to display  EKG   Radiology No results found.  Procedures Procedures (including critical care time)  Medications Ordered in UC Medications - No data to display  Initial Impression / Assessment and Plan / UC Course  I have reviewed the triage vital signs and the nursing notes.  Pertinent labs & imaging results that were available during my care of the patient were reviewed by me and considered in my medical decision making (see chart for details).     No active abscess in nostril visible but will  send mupirocin ointment for him to apply with a Q-tip twice daily until symptoms fully resolved.  Suspect his sinus symptoms related to uncontrolled seasonal allergies.  Will start Singulair in addition to his home Allegra, Flonase twice daily, sinus rinses as needed.  Follow-up with primary care for recheck  Final Clinical Impressions(s) / UC Diagnoses   Final diagnoses:  Nasal pain  Seasonal allergic rhinitis due to other allergic trigger  Sinus pressure     Discharge Instructions     Try the neilmed sinus rinse system once to twice daily when your sinuses become congested. Take your home antihistamine daily in addition to the singulair    ED Prescriptions    Medication Sig Dispense Auth. Provider   montelukast (SINGULAIR) 10 MG tablet Take 1 tablet Daily for Allergies 90 tablet Particia Nearing, PA-C   fluticasone [facility]) 50 MCG/ACT nasal spray Place 1 spray into both nostrils in the morning and at bedtime. Spray upward and outward toward corners of eyes 16 g Particia Nearing, PA-C   mupirocin nasal ointment (BACTROBAN) 2 % Apply in right nostril BID 1 g Particia Nearing, [place]     PDMP not reviewed this encounter.  Particia NearingLane, Tevion Laforge [place], [place] 08/18/20 [phone]

## 2020-08-23 ENCOUNTER — Encounter: Payer: Self-pay | Admitting: *Deleted

## 2020-08-24 DIAGNOSIS — M5416 Radiculopathy, lumbar region: Secondary | ICD-10-CM | POA: Diagnosis not present

## 2020-09-06 ENCOUNTER — Other Ambulatory Visit: Payer: Self-pay | Admitting: Internal Medicine

## 2020-09-06 DIAGNOSIS — I10 Essential (primary) hypertension: Secondary | ICD-10-CM

## 2020-09-06 MED ORDER — LOSARTAN POTASSIUM-HCTZ 100-25 MG PO TABS: ORAL_TABLET | ORAL | 3 refills | Status: DC

## 2020-09-11 ENCOUNTER — Other Ambulatory Visit: Payer: Self-pay

## 2020-09-11 ENCOUNTER — Ambulatory Visit (HOSPITAL_COMMUNITY)
Admission: EM | Admit: 2020-09-11 | Discharge: 2020-09-11 | Disposition: A | Discharge status: Home / Self Care | Payer: PPO | Attending: Urgent Care | Admitting: Urgent Care

## 2020-09-11 ENCOUNTER — Encounter (HOSPITAL_COMMUNITY): Payer: Self-pay | Admitting: *Deleted

## 2020-09-11 DIAGNOSIS — J019 Acute sinusitis, unspecified: Secondary | ICD-10-CM

## 2020-09-11 DIAGNOSIS — J3089 Other allergic rhinitis: Secondary | ICD-10-CM | POA: Diagnosis not present

## 2020-09-11 LAB — CBG MONITORING, ED: Glucose-Capillary: 138 mg/dL — ABNORMAL HIGH (ref 70–99)

## 2020-09-11 MED ORDER — PREDNISONE 20 MG PO TABS: ORAL_TABLET | ORAL | 0 refills | Status: DC

## 2020-09-11 MED ORDER — AMOXICILLIN-POT CLAVULANATE 875-125 MG PO TABS: 1.0000 | ORAL_TABLET | Freq: Two times a day (BID) | ORAL | 0 refills | Status: DC

## 2020-09-11 NOTE — ED Provider Notes (Signed)
[facility]   MRN: [id number] DOB: [birth year]  Subjective:   Wayne [name] is a 70 y.o. male presenting for 3-4 week history of persistent sinus congestion, postnasal drainage, left-sided facial pain, coughing.  Denies fever, ear pain, chest pain, shortness of breath, body aches.  Patient was last seen in the first week of April, was started on Flonase and reports that it helped for a bit but his symptoms have progressively worsened.  He does have type 2 diabetes, last A1c was 8.1% in January of this year.  No current facility-administered medications for this encounter.  Current Outpatient Medications:  .  aspirin 81 MG tablet, Take 81 mg by mouth daily., Disp: , Rfl:  .  atenolol (TENORMIN) 100 MG tablet, Take  1 tablet  Daily  for BP, Disp: 90 tablet, Rfl: 1 .  citalopram (CELEXA) 40 MG tablet, Take 1 tablet Daily for Mood, Disp: 90 tablet, Rfl: 1 .  Continuous Blood Gluc Sensor (FREESTYLE LIBRE 14 DAY SENSOR) MISC, USE TO CHECK BLOOD SUGARS THREE TIMES A DAY AS DIRECTED, Disp: 3 each, Rfl: 3 .  fluticasone (FLONASE) 50 MCG/ACT nasal spray, Place 1 spray into both nostrils in the morning and at bedtime. Spray upward and outward toward corners of eyes, Disp: 16 g, Rfl: 2 .  glucose blood (ONETOUCH VERIO) test strip, Check blood sugar 1 time a day-DX-E11.22., Disp: 100 each, Rfl: 4 .  losartan-hydrochlorothiazide (HYZAAR) 100-25 MG tablet, Take  1 tablet  Daily  for BP & Fluid Retention / Ankle Swelling, Disp: 90 tablet, Rfl: 3 .  Magnesium 400 MG CAPS, Take 1 capsuile Daily (Patient taking differently: 500 mg. Takes 2 capsules Daily), Disp: , Rfl:  .  metFORMIN (GLUCOPHAGE-XR) 500 MG 24 hr tablet, Take  2 tablets  2 x /day  with Meals  for Diabetes, Disp: 360 tablet, Rfl: 1 .  montelukast (SINGULAIR) 10 MG tablet, Take 1 tablet Daily for Allergies, Disp: 90 tablet, Rfl: 0 .  mupirocin nasal ointment (BACTROBAN) 2 %, Apply in right nostril BID, Disp: 1 g, Rfl: 0 .   pantoprazole (PROTONIX) 40 MG tablet, Take 40 mg by mouth daily. , Disp: , Rfl:  .  Probiotic Product (PROBIOTIC DAILY PO), Take by mouth daily., Disp: , Rfl:  .  rosuvastatin (CRESTOR) 20 MG tablet, TAKE ONE TABLET BY MOUTH DAILY FOR CHOLESTEROL, Disp: 90 tablet, Rfl: 1 .  VITAMIN D PO, Take 10,000 Units by mouth daily. , Disp: , Rfl:  .  zinc gluconate 50 MG tablet, Take 50 mg by mouth daily., Disp: , Rfl:    No Known Allergies  Past Medical History:  Diagnosis Date  . Allergy   . Anxiety    On Xanax  . Chest pain   . Fatigue   . GERD (gastroesophageal reflux disease)   . Heart murmur    hx of MVP   . HTN (hypertension)   . Hyperlipemia   . IBS (irritable bowel syndrome)   . Medication management 06/13/2013  . Mitral valve prolapse   . Obesity (BMI 30.0-34.9) 08/20/2015  . OSA (obstructive sleep apnea)    Restated CPAP  . Sleep apnea    no cpap   . T2_NIDDM   . Testosterone deficiency 08/20/2015  . Type II or unspecified type diabetes mellitus without mention of complication, not stated as uncontrolled   . Vitamin D deficiency      Past Surgical History:  Procedure Laterality Date  . COLONOSCOPY    .  TONSILLECTOMY    . WISDOM TOOTH EXTRACTION      Family History  Problem Relation Age of Onset  . Stroke Other   . Hypertension Other   . Hyperlipidemia Other   . Diabetes Other   . Colon cancer Father 74  . Hypertension Father   . Dementia Mother   . Alzheimer's disease Mother   . Colon polyps Neg Hx   . Esophageal cancer Neg Hx   . Rectal cancer Neg Hx   . Stomach cancer Neg Hx     Social History   Tobacco Use  . Smoking status: Former Smoker    Packs/day: 1.00    Years: 19.00    Pack years: 19.00    Types: Cigarettes    Start date: 41    Quit date: 09/06/2007    Years since quitting: 13.0  . Smokeless tobacco: Never Used  Vaping Use  . Vaping Use: Never used  Substance Use Topics  . Alcohol use: Yes    Comment: occasional  . Drug use: No     ROS   Objective:   Vitals: BP 127/85 (BP Location: Left Arm)   Pulse 64   Temp (!) 97.5 F (36.4 C) (Oral)   Resp 16   SpO2 96%   Physical Exam Constitutional:      General: He is not in acute distress.    Appearance: Normal appearance. He is well-developed and normal weight. He is not ill-appearing, toxic-appearing or diaphoretic.  HENT:     Head: Normocephalic and atraumatic.     Right Ear: Tympanic membrane, ear canal and external ear normal. There is no impacted cerumen.     Left Ear: Tympanic membrane, ear canal and external ear normal. There is no impacted cerumen.     Nose: Congestion present. No rhinorrhea.     Comments: Left-sided maxillary sinus tenderness.    Mouth/Throat:     Mouth: Mucous membranes are moist.     Pharynx: No oropharyngeal exudate or posterior oropharyngeal erythema.     Comments: Thick streaks of postnasal drainage. Eyes:     General: No scleral icterus.       Right eye: No discharge.        Left eye: No discharge.     Extraocular Movements: Extraocular movements intact.     Conjunctiva/sclera: Conjunctivae normal.     Pupils: Pupils are equal, round, and reactive to light.  Cardiovascular:     Rate and Rhythm: Normal rate and regular rhythm.     Heart sounds: Normal heart sounds. No murmur heard. No friction rub. No gallop.   Pulmonary:     Effort: Pulmonary effort is normal. No respiratory distress.     Breath sounds: Normal breath sounds. No stridor. No wheezing, rhonchi or rales.  Musculoskeletal:     Cervical back: Normal range of motion and neck supple. No rigidity. No muscular tenderness.  Neurological:     General: No focal deficit present.     Mental Status: He is alert and oriented to person, place, and time.  Psychiatric:        Mood and Affect: Mood normal.        Behavior: Behavior normal.        Thought Content: Thought content normal.        Judgment: Judgment normal.      Results for orders placed or performed  during the hospital encounter of 09/11/20 (from the past 24 hour(s))  POC CBG monitoring  Status: Abnormal   Collection Time: 09/11/20 11:05 AM  Result Value Ref Range   Glucose-Capillary 138 (H) 70 - 99 mg/dL    Assessment and Plan :   PDMP not reviewed this encounter.  1. Acute non-recurrent sinusitis, unspecified location   2. Allergic rhinitis due to other allergic trigger, unspecified seasonality     Will start empiric treatment for sinusitis with Augmentin, prednisone given 4 week history of persistent symptoms and mild response to Flonase.  Recommended supportive care otherwise including the use of oral antihistamine, decongestant. Counseled patient on potential for adverse effects with medications prescribed/recommended today, ER and return-to-clinic precautions discussed, patient verbalized understanding.    Wallis Bamberg, [place] 09/11/20 1694

## 2020-09-11 NOTE — ED Triage Notes (Signed)
Pt reports sinus problems worse last few days

## 2020-10-05 ENCOUNTER — Ambulatory Visit: Payer: PPO | Admitting: Adult Health

## 2020-10-08 ENCOUNTER — Ambulatory Visit: Payer: PPO | Admitting: Adult Health

## 2020-11-11 ENCOUNTER — Other Ambulatory Visit: Payer: Self-pay | Admitting: Internal Medicine

## 2020-11-11 MED ORDER — DEXAMETHASONE 4 MG PO TABS: ORAL_TABLET | ORAL | 0 refills | Status: DC

## 2020-11-12 ENCOUNTER — Other Ambulatory Visit: Payer: Self-pay | Admitting: Family Medicine

## 2020-11-12 DIAGNOSIS — Z9109 Other allergy status, other than to drugs and biological substances: Secondary | ICD-10-CM

## 2020-11-24 DIAGNOSIS — M48062 Spinal stenosis, lumbar region with neurogenic claudication: Secondary | ICD-10-CM | POA: Diagnosis not present

## 2020-11-30 ENCOUNTER — Other Ambulatory Visit: Payer: Self-pay | Admitting: Internal Medicine

## 2020-12-16 DIAGNOSIS — M48062 Spinal stenosis, lumbar region with neurogenic claudication: Secondary | ICD-10-CM | POA: Diagnosis not present

## 2020-12-28 ENCOUNTER — Ambulatory Visit: Payer: PPO | Admitting: Adult Health

## 2020-12-28 NOTE — Progress Notes (Deleted)
MEDICARE ANNUAL WELLNESS VISIT AND FOLLOW UP Assessment:   Wayne [name] was seen today for follow-up and medicare wellness.  Diagnoses and all orders for this visit:  Encounter for Medicare annual wellness exam Due annually  Reminded to schedule diabetes eye exam with Dr. Joseph Art, forward report for retinopathy Had covid 19 vaccines, pending receipt of proof for documentation - he will message photo of card  Aortic atherosclerosis (HCC) Per CT 10/2018 Control blood pressure, cholesterol, glucose, increase exercise.   Coronary artery disease involving native coronary artery of native heart without angina pectoris Follows with cardiology High risk per coronary calcium score Discussed increasing statin for LDL goal <70 Possible benefit from jardiance, 38% CVD death risk reduction, samples given, check with insurance Control blood pressure, cholesterol, glucose, increase exercise.  Reminder to go to the ER if any CP, SOB, nausea, dizziness, severe HA, changes vision/speech, left arm numbness and tingling and jaw pain. -     Lipid panel  Essential hypertension -     CBC with Differential/Platelet -     COMPLETE METABOLIC PANEL WITH GFR -     Magnesium  Type 2 diabetes mellitus with stage 2 chronic kidney disease, without long-term current use of insulin [facility]) Education: Reviewed 'ABCs' of diabetes management (respective goals in parentheses):  A1C (<7), blood pressure (<130/80), and cholesterol (LDL <70) Eye Exam yearly and Dental Exam every 6 months  Dietary recommendations Physical Activity recommendations Discussed Jardiance - 2ndary CVD death reduced 38% with established T2DM and CVD, he is interested, samples 10 mg x 2 weeks and 25 mg x 2 weeks provided, will check coverage with insurance -     COMPLETE METABOLIC PANEL WITH GFR -     Hemoglobin A1c  Hyperlipidemia associated with type 2 diabetes mellitus (HCC) Continue medications- rosuvastatin  Titrate for LDL goal <70 Continue low  cholesterol diet and exercise.  Check lipid panel.  -     Lipid panel -     TSH  CKD stage 2 due to type 2 diabetes mellitus (HCC) Increase fluids, avoid NSAIDS, monitor sugars, will monitor -     COMPLETE METABOLIC PANEL WITH GFR  Morbid obesity - BMI 30+ with sleep apnea Long discussion about weight loss, diet, and exercise Recommended diet heavy in fruits and veggies and low in animal meats, cheeses, and dairy products, appropriate calorie intake Discussed appropriate weight for height and initial goal (<210lb) Follow up at next visit  Irritable bowel syndrome, unspecified type Add soluble fiber, monitor   Gastroesophageal reflux disease, unspecified whether esophagitis present Well managed on current medications Discussed diet, avoiding triggers and other lifestyle changes  Sleep apnea, unspecified type Continue CPAP  Vitamin D deficiency Continue supplement   Testosterone deficiency Not on supplement, declined injections, has added zinc, check levels  -     Testosterone  Former smoker Quit 2009, 19 pack year history; denies concerning sx Not candidate for Ct screening, obtain routine CXR  Anxiety Controlled by meds; monitor  Chronic left-sided low back pain with left-sided sciatica Continue follow up Dr. Jule Ser    Over 30 minutes of exam, counseling, chart review, and critical decision making was performed  Future Appointments  Date Time Provider Department Center  12/28/2020  4:00 PM Judd Gaudier, NP [facility] None  01/14/2021 11:00 AM Lucky Cowboy, MD [facility] None  06/06/2021  3:00 PM Lucky Cowboy, MD [facility] None  12/29/2021  4:00 PM Judd Gaudier, NP [facility] None     Plan:   During the course of  the visit the patient was educated and counseled about appropriate screening and preventive services including:   Pneumococcal vaccine  Influenza vaccine Prevnar 13 Td vaccine Screening electrocardiogram Colorectal cancer  screening Diabetes screening Glaucoma screening Nutrition counseling    Subjective:  Wayne [name] is a 70 y.o. male who presents for Medicare Annual Wellness Visit and 3 month follow up for HTN, hyperlipidemia, T2DM with CKD II, and vitamin D Def.   He has been following with Dr. Jule Ser for lower back pain, some degenerative disc, spinal stenosis with left radicular symptoms. Has been doing epidurals q73m which do help some. Has been taking tylenol, ibuprofen, heat as needed, pain is worse with walking/standing, resolves when he lies down.   he has a diagnosis of anxiety and is currently prescribed celexa 40 mg daily, reports symptoms are well controlled on current regimen.   He is on CPAP machine since 2020, states he is breathing better, having less headaches and has more energy when he wakes up.   BMI is There is no height or weight on file to calculate BMI., he has been working on diet and exercise. Back limits walking, but does well with stationary cycle, spends 30 min about once a week.  Wt Readings from Last 3 Encounters:  05/19/20 233 lb (105.7 kg)  04/20/20 231 lb 3.2 oz (104.9 kg)  03/08/20 225 lb (102.1 kg)   Former smoker, quit in 2009, 19 pack year history.  Coronary calcium score 301 in July 2020, 93rd percentile for age, then had CTA with about 50 % at LAD. Follows with *** Aortic atherosclerosis per CT in June 2020.   His blood pressure has been controlled at home, today their BP is   He does workout. He denies chest pain, shortness of breath, dizziness.   He is on cholesterol medication (rosuvastatin 10 mg daily) and denies myalgias. His cholesterol is not at goal for LDL <70. The cholesterol last visit was:   Lab Results  Component Value Date   CHOL 124 05/19/2020   HDL 38 (L) 05/19/2020   LDLCALC 69 05/19/2020   TRIG 90 05/19/2020   CHOLHDL 3.3 05/19/2020   He has been working on diet and exercise for T2DM with CKD II on metformin, and denies increased  appetite, nausea, paresthesia of the feet, polydipsia, polyuria and visual disturbances.  HE has glucometer but admits hasn't been checking  He is on ASA, statin, ARB.  Last A1C in the office was:  Lab Results  Component Value Date   HGBA1C 8.1 (H) 05/19/2020   He has CKD II associated with T2DM monitored at this office. Last GFR  Lab Results  Component Value Date   GFRAA 105 05/19/2020   Patient is on Vitamin D supplement.   Lab Results  Component Value Date   VD25OH 95 05/19/2020     He has a history of testosterone deficiency and is not on testosterone replacement. Denies notable sx of testosterone def.  Is on zinc 50 mg since last visit. *** Lab Results  Component Value Date   TESTOSTERONE 294 10/03/2019      Medication Review:  Current Outpatient Medications (Endocrine & Metabolic):    dexamethasone (DECADRON) 4 MG tablet, Take 1 tab 3 x day - 3 days, then 2 x day - 3 days, then 1 tab daily   metFORMIN (GLUCOPHAGE-XR) 500 MG 24 hr tablet, Take  2 tablets  2 x /day  with Meals  for Diabetes   predniSONE (DELTASONE) 20 MG  tablet, Take 2 tablets daily with breakfast.  Current Outpatient Medications (Cardiovascular):    atenolol (TENORMIN) 100 MG tablet, Take  1 tablet  Daily  for BP   losartan-hydrochlorothiazide (HYZAAR) 100-25 MG tablet, Take  1 tablet  Daily  for BP & Fluid Retention / Ankle Swelling   rosuvastatin (CRESTOR) 20 MG tablet, TAKE ONE TABLET BY MOUTH DAILY FOR CHOLESTEROL  Current Outpatient Medications (Respiratory):    fluticasone (FLONASE) 50 MCG/ACT nasal spray, Place 1 spray into both nostrils in the morning and at bedtime. Spray upward and outward toward corners of eyes   montelukast (SINGULAIR) 10 MG tablet, Take 1 tablet Daily for Allergies   mupirocin nasal ointment (BACTROBAN) 2 %, Apply in right nostril BID  Current Outpatient Medications (Analgesics):    aspirin 81 MG tablet, Take 81 mg by mouth daily.   Current Outpatient Medications  (Other):    amoxicillin-clavulanate (AUGMENTIN) 875-125 MG tablet, Take 1 tablet by mouth every 12 (twelve) hours.   citalopram (CELEXA) 40 MG tablet, TAKE ONE TABLET BY MOUTH DAILY FOR MOOD   Continuous Blood Gluc Sensor (FREESTYLE LIBRE 14 DAY SENSOR) MISC, USE TO CHECK BLOOD SUGARS THREE TIMES A DAY AS DIRECTED   glucose blood (ONETOUCH VERIO) test strip, Check blood sugar 1 time a day-[id number].   Magnesium 400 MG CAPS, Take 1 capsuile Daily (Patient taking differently: 500 mg. Takes 2 capsules Daily)   pantoprazole (PROTONIX) 40 MG tablet, Take 40 mg by mouth daily.    Probiotic Product (PROBIOTIC DAILY PO), Take by mouth daily.   VITAMIN D PO, Take 10,000 Units by mouth daily.    zinc gluconate 50 MG tablet, Take 50 mg by mouth daily.  Allergies: No Known Allergies  Current Problems (verified) has HTN (hypertension); Hyperlipidemia associated with type 2 diabetes mellitus (HCC); GERD (gastroesophageal reflux disease); Anxiety; Type 2 diabetes mellitus with stage 2 chronic kidney disease, without long-term current use of insulin (HCC); OSA (obstructive sleep apnea); IBS (irritable bowel syndrome); Vitamin D deficiency; Morbid obesity (HCC) - BMI 30+ with sleep apnea; Testosterone deficiency; Former smoker; CKD stage 2 due to type 2 diabetes mellitus (HCC); CAD (coronary artery disease); Aortic atherosclerosis (HCC) by Chest CT scan 06.23.2020; and Chronic left-sided low back pain with left-sided sciatica on their problem list.  Screening Tests Immunization History  Administered Date(s) Administered   Influenza Inj Mdck Quad With Preservative 01/29/2017   Influenza Split 03/01/2015   Influenza, High Dose Seasonal PF 03/19/2018, 04/23/2019, 03/08/2020   Influenza,inj,quad, With Preservative 03/03/2016   Moderna Sars-Covid-2 Vaccination 06/15/2019, 07/13/2019, 05/03/2020   PPD Test 01/30/2014, 01/29/2017, 03/19/2018, 07/28/2020   Pneumococcal Conjugate-13 03/19/2018   Pneumococcal  Polysaccharide-23 05/21/2008, 04/23/2019   Td 10/30/2003   Tdap 01/30/2014   *** Preventative care: Last colonoscopy: 03/2017, Dr. Christella HartiganJacobs, never polyps, due 5 years, family history (father in 3260s)  Prior vaccinations: TD or Tdap: 2015  Influenza: 04/2019  Pneumococcal: 2010, 2020 Prevnar13: 2019 Shingles/Zostavax:  Covid 19: has had 2/2, 2021, will send info  Names of Other Physician/Practitioners you currently use: 1. [facility] here for primary care 2. Dr. Lucretia RoersWood, eye doctor, last visit, 09/02/2018 abstracted, [facility]  - patient will schedule a follow up 3. [facility], dentist, last visit 2021  Patient Care Team: Lucky CowboyMcKeown, William, MD as PCP - General (Internal Medicine) Baldo DaubMunley, Brian J, MD as Consulting Physician (Cardiology) Shirlean KellyNudelman, Robert, MD as Consulting Physician (Neurosurgery)  Surgical: He  has a past surgical history that includes Wisdom tooth extraction; Tonsillectomy;  and Colonoscopy. Family His family history includes Alzheimer's disease in his mother; Colon cancer (age of onset: 38) in his father; Dementia in his mother; Diabetes in an other family member; Hyperlipidemia in an other family member; Hypertension in his father and another family member; Stroke in an other family member. Social history  He reports that he quit smoking about 13 years ago. His smoking use included cigarettes. He started smoking about 32 years ago. He has a 19.00 pack-year smoking history. He has never used smokeless tobacco. He reports current alcohol use. He reports that he does not use drugs.  MEDICARE WELLNESS OBJECTIVES: Physical activity:   Cardiac risk factors:   Depression/mood screen:   Depression screen [facility] 2/9 05/18/2020  Decreased Interest 0  Down, Depressed, Hopeless 0  PHQ - 2 Score 0    ADLs:  In your present state of health, do you have any difficulty performing the following activities: 05/18/2020 04/20/2020  Hearing? N N  Vision? N N   Difficulty concentrating or making decisions? N -  Walking or climbing stairs? N N  Dressing or bathing? N N  Doing errands, shopping? N N  Some recent data might be hidden     Cognitive Testing  Alert? Yes  Normal Appearance?Yes  Oriented to person? Yes  Place? Yes   Time? Yes  Recall of three objects?  Yes  Can perform simple calculations? Yes  Displays appropriate judgment?Yes  Can read the correct time from a watch face?Yes  EOL planning:     Objective:   There were no vitals filed for this visit.  There is no height or weight on file to calculate BMI.  General appearance: alert, no distress, WD/WN, male HEENT: normocephalic, sclerae anicteric, TMs pearly, nares patent, no discharge or erythema, pharynx normal Oral cavity: MMM, no lesions Neck: supple, no lymphadenopathy, no thyromegaly, no masses Heart: RRR, normal S1, S2, no murmurs Lungs: CTA bilaterally, no wheezes, rhonchi, or rales Abdomen: +bs, soft, non tender, non distended, no masses, no hepatomegaly, no splenomegaly Musculoskeletal: nontender, no swelling, no obvious deformity Extremities: no edema, no cyanosis, no clubbing Pulses: 2+ symmetric, upper and lower extremities, normal cap refill Neurological: alert, oriented x 3, CN2-12 intact, strength normal upper extremities and lower extremities, sensation normal throughout, DTRs 2+ throughout, no cerebellar signs, gait normal Psychiatric: normal affect, behavior normal, pleasant   Medicare Attestation I have personally reviewed: The patient's medical and social history Their use of alcohol, tobacco or illicit drugs Their current medications and supplements The patient's functional ability including ADLs,fall risks, home safety risks, cognitive, and hearing and visual impairment Diet and physical activities Evidence for depression or mood disorders  The patient's weight, height, BMI, and visual acuity have been recorded in the chart.  I have made  referrals, counseling, and provided education to the patient based on review of the above and I have provided the patient with a written personalized care plan for preventive services.     Dan Maker, NP   12/28/2020

## 2021-01-14 ENCOUNTER — Ambulatory Visit: Payer: PPO | Admitting: Internal Medicine

## 2021-01-24 ENCOUNTER — Encounter: Payer: Self-pay | Admitting: Internal Medicine

## 2021-01-24 NOTE — Progress Notes (Addendum)
   C  A  N  C  E  L  E  D   At App't   time  !   (   3rd recent cancellation at the last minute  !  )

## 2021-01-25 ENCOUNTER — Ambulatory Visit: Payer: PPO | Admitting: Internal Medicine

## 2021-01-25 DIAGNOSIS — E1122 Type 2 diabetes mellitus with diabetic chronic kidney disease: Secondary | ICD-10-CM

## 2021-01-25 DIAGNOSIS — Z79899 Other long term (current) drug therapy: Secondary | ICD-10-CM

## 2021-01-25 DIAGNOSIS — I10 Essential (primary) hypertension: Secondary | ICD-10-CM

## 2021-01-25 DIAGNOSIS — I700 Atherosclerosis of aorta: Secondary | ICD-10-CM

## 2021-01-25 DIAGNOSIS — E1169 Type 2 diabetes mellitus with other specified complication: Secondary | ICD-10-CM

## 2021-01-25 DIAGNOSIS — E559 Vitamin D deficiency, unspecified: Secondary | ICD-10-CM

## 2021-01-26 ENCOUNTER — Ambulatory Visit: Payer: PPO | Admitting: Internal Medicine

## 2021-01-26 ENCOUNTER — Other Ambulatory Visit: Payer: Self-pay

## 2021-01-26 VITALS — BP 118/74 | HR 82 | Temp 97.6°F | Resp 16 | Ht 72.0 in | Wt 232.0 lb

## 2021-01-26 DIAGNOSIS — E1122 Type 2 diabetes mellitus with diabetic chronic kidney disease: Secondary | ICD-10-CM | POA: Diagnosis not present

## 2021-01-26 DIAGNOSIS — I10 Essential (primary) hypertension: Secondary | ICD-10-CM | POA: Diagnosis not present

## 2021-01-26 DIAGNOSIS — E1169 Type 2 diabetes mellitus with other specified complication: Secondary | ICD-10-CM | POA: Diagnosis not present

## 2021-01-26 DIAGNOSIS — N182 Chronic kidney disease, stage 2 (mild): Secondary | ICD-10-CM

## 2021-01-26 DIAGNOSIS — E785 Hyperlipidemia, unspecified: Secondary | ICD-10-CM

## 2021-01-26 DIAGNOSIS — R69 Illness, unspecified: Secondary | ICD-10-CM

## 2021-01-26 DIAGNOSIS — I700 Atherosclerosis of aorta: Secondary | ICD-10-CM

## 2021-01-26 DIAGNOSIS — Z79899 Other long term (current) drug therapy: Secondary | ICD-10-CM | POA: Diagnosis not present

## 2021-01-26 DIAGNOSIS — E559 Vitamin D deficiency, unspecified: Secondary | ICD-10-CM | POA: Diagnosis not present

## 2021-01-26 NOTE — Patient Instructions (Signed)

## 2021-01-26 NOTE — Progress Notes (Addendum)
    Patient   Left   Before    Seen

## 2021-01-27 LAB — CBC WITH DIFFERENTIAL/PLATELET
Absolute Monocytes: 419 cells/uL (ref 200–950)
Basophils Absolute: 78 cells/uL (ref 0–200)
Basophils Relative: 1.1 %
Eosinophils Absolute: 249 cells/uL (ref 15–500)
Eosinophils Relative: 3.5 %
HCT: 37.7 % — ABNORMAL LOW (ref 38.5–50.0)
Hemoglobin: 12.6 g/dL — ABNORMAL LOW (ref 13.2–17.1)
Lymphs Abs: 3728 cells/uL (ref 850–3900)
MCH: 30.4 pg (ref 27.0–33.0)
MCHC: 33.4 g/dL (ref 32.0–36.0)
MCV: 91.1 fL (ref 80.0–100.0)
MPV: 11.4 fL (ref 7.5–12.5)
Monocytes Relative: 5.9 %
Neutro Abs: 2627 cells/uL (ref 1500–7800)
Neutrophils Relative %: 37 %
Platelets: 299 10*3/uL (ref 140–400)
RBC: 4.14 10*6/uL — ABNORMAL LOW (ref 4.20–5.80)
RDW: 12.4 % (ref 11.0–15.0)
Total Lymphocyte: 52.5 %
WBC: 7.1 10*3/uL (ref 3.8–10.8)

## 2021-01-27 LAB — COMPLETE METABOLIC PANEL WITH GFR
AG Ratio: 1.5 (calc) (ref 1.0–2.5)
ALT: 19 U/L (ref 9–46)
AST: 19 U/L (ref 10–35)
Albumin: 4.1 g/dL (ref 3.6–5.1)
Alkaline phosphatase (APISO): 62 U/L (ref 35–144)
BUN: 18 mg/dL (ref 7–25)
CO2: 31 mmol/L (ref 20–32)
Calcium: 9.9 mg/dL (ref 8.6–10.3)
Chloride: 101 mmol/L (ref 98–110)
Creat: 0.88 mg/dL (ref 0.70–1.35)
Globulin: 2.8 g/dL (calc) (ref 1.9–3.7)
Glucose, Bld: 204 mg/dL — ABNORMAL HIGH (ref 65–99)
Potassium: 4.1 mmol/L (ref 3.5–5.3)
Sodium: 138 mmol/L (ref 135–146)
Total Bilirubin: 0.6 mg/dL (ref 0.2–1.2)
Total Protein: 6.9 g/dL (ref 6.1–8.1)
eGFR: 94 mL/min/{1.73_m2} (ref >=60)

## 2021-01-27 LAB — VITAMIN D 25 HYDROXY (VIT D DEFICIENCY, FRACTURES): Vit D, 25-Hydroxy: 94 ng/mL (ref 30–100)

## 2021-01-27 LAB — LIPID PANEL
Cholesterol: 128 mg/dL (ref <200)
HDL: 36 mg/dL — ABNORMAL LOW (ref >=40)
LDL Cholesterol (Calc): 71 mg/dL (calc)
Non-HDL Cholesterol (Calc): 92 mg/dL (calc) (ref <130)
Total CHOL/HDL Ratio: 3.6 (calc) (ref <5.0)
Triglycerides: 119 mg/dL (ref <150)

## 2021-01-27 LAB — HEMOGLOBIN A1C
Hgb A1c MFr Bld: 10 % of total Hgb — ABNORMAL HIGH (ref <5.7)
Mean Plasma Glucose: 240 mg/dL
eAG (mmol/L): 13.3 mmol/L

## 2021-01-27 LAB — INSULIN, RANDOM: Insulin: 41.1 u[IU]/mL — ABNORMAL HIGH

## 2021-01-27 LAB — MAGNESIUM: Magnesium: 1.8 mg/dL (ref 1.5–2.5)

## 2021-01-27 LAB — TSH: TSH: 1.72 mIU/L (ref 0.40–4.50)

## 2021-01-27 NOTE — Progress Notes (Signed)
============================================================ -   Test results slightly outside the reference range are not unusual. If there is anything important, I will review this with you,  otherwise it is considered normal test values.  If you have further questions,  please do not hesitate to contact me at the office or via My Chart.  ============================================================ ============================================================  -  Total Chol = 128  & LDL Chol = 71 - Both  Excellent   - Very low risk for Heart Attack  / Stroke ============================================================ ============================================================  -  Glucose = 204 mg%   - Way to high   - and  - A1c = 10.0% - Horrible !        (  Ideal or goal is less than 5.7%  !  )   - Need Office visit to discuss changing meds   ============================================================ ============================================================  -  Magnesium  -   1.8    -  very  low- goal is betw 2.0 - 2.5,   - So..............Marland Kitchen  Recommend that you take  Magnesium 500 mg tablet x 2 tablets / daily   - also important to eat lots of  leafy green vegetables   - spinach - Kale - collards - greens - okra - asparagus  - broccoli - quinoa - squash - almonds   - black, red, white beans  -  peas - green beans ============================================================ ============================================================  - All else OK  ============================================================ ============================================================

## 2021-01-30 IMAGING — CT CT HEART SCORING
2 series · 16 of 20 positions shown, 18 images · non-contrast
Comparison: None.

Addendum:
CLINICAL DATA: Risk stratification

EXAM:
Coronary Calcium Score
MEDICATIONS:
None
TECHNIQUE: The patient was scanned on a Siemens Force scanner. Axial
non-contrast 3 mm slices were carried out through the heart. The
data set was analyzed on a dedicated work station and scored using
the Agatson method.

[Series 2: casc 3.0 i36f 2 bestdiast 70 % · axial · 0.42mm/px · z∈[-261,-156]mm · 8 of 45 slices shown, 10 images]
[im 5/45  vessel]
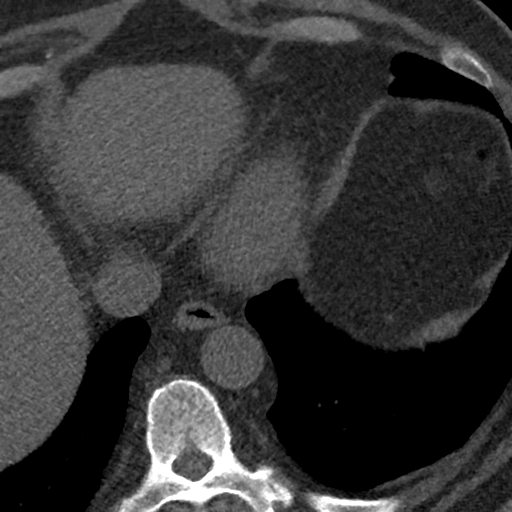
[im 5/45  lung]
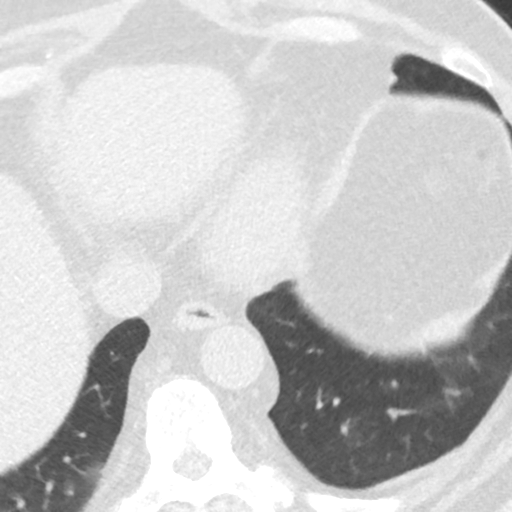
[im 10/45  vessel]
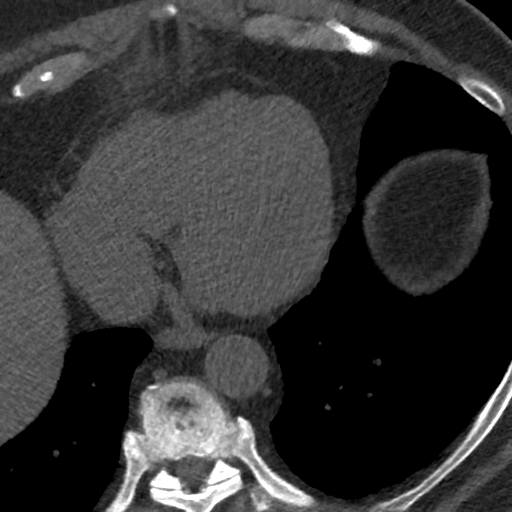
[im 15/45  vessel]
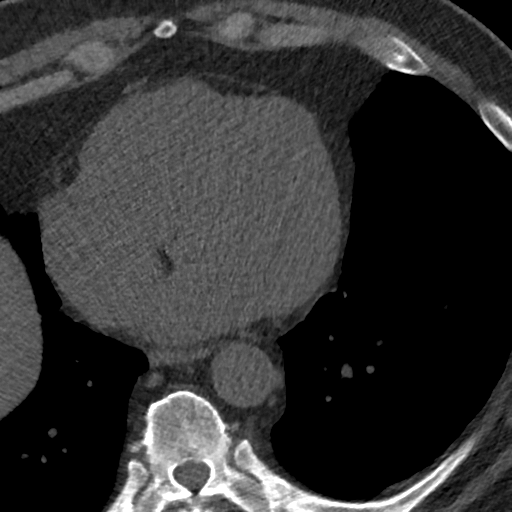
[im 20/45  vessel]
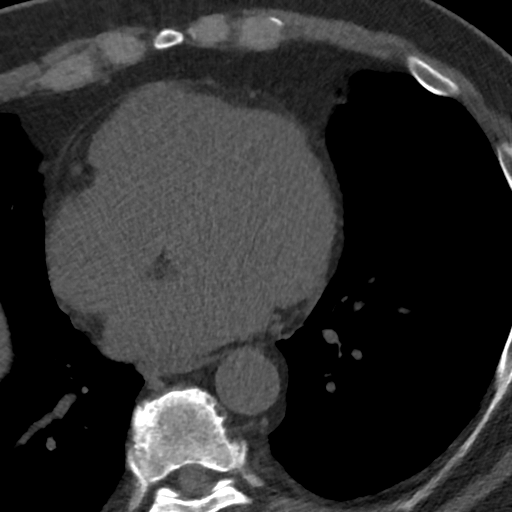
[im 25/45  vessel]
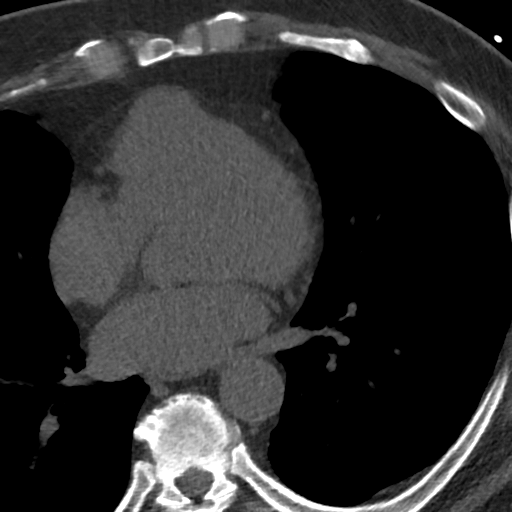
[im 25/45  lung]
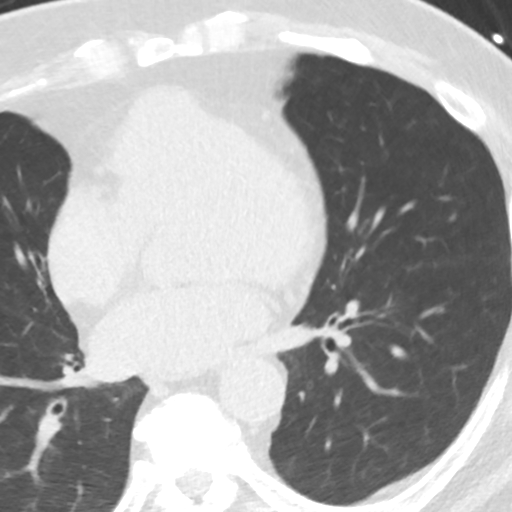
[im 30/45  vessel]
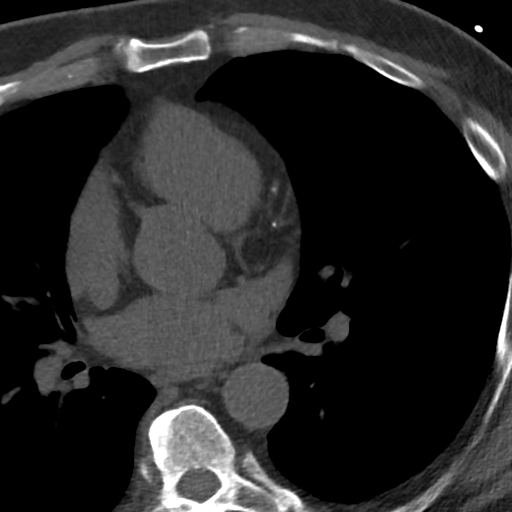
[im 35/45  vessel]
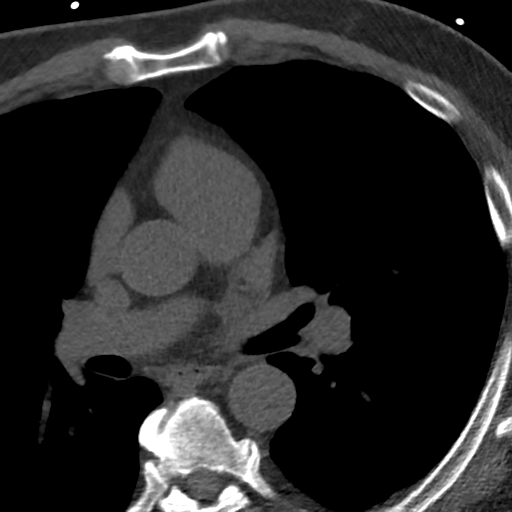
[im 40/45  vessel]
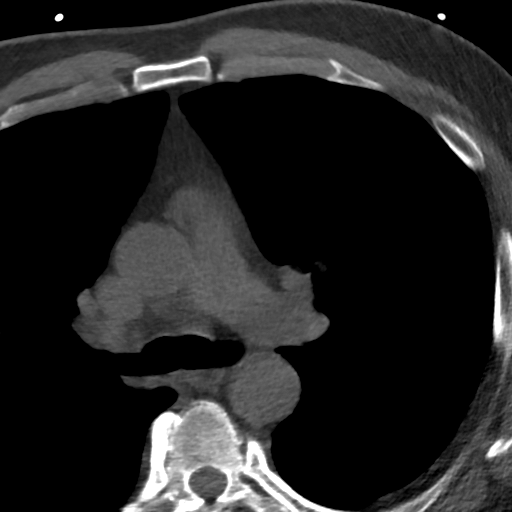

[Series 4: lung st 68 % · axial · 0.74mm/px · z∈[-261,-156]mm · 8 of 45 slices shown]
[im 5/45  lung]
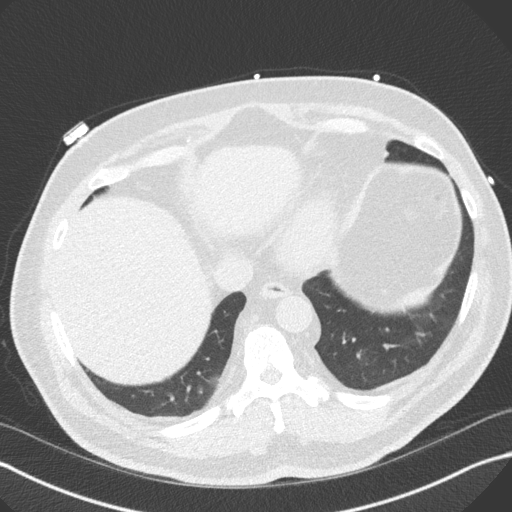
[im 10/45  lung]
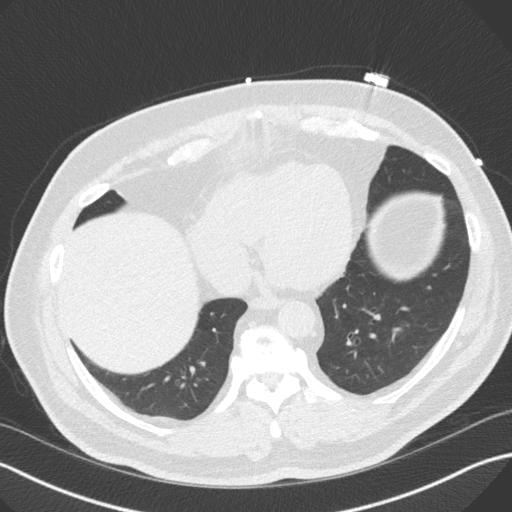
[im 15/45  lung]
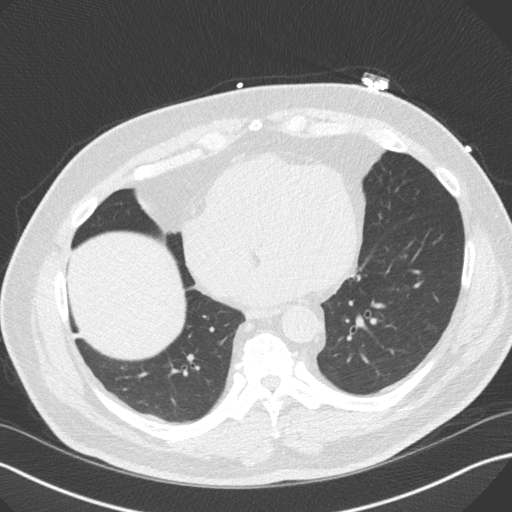
[im 20/45  lung]
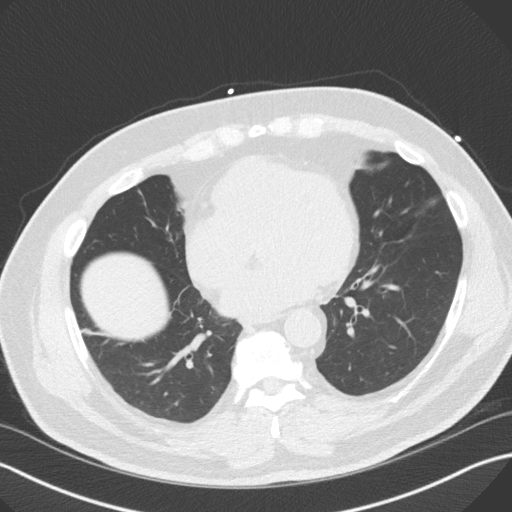
[im 25/45  lung]
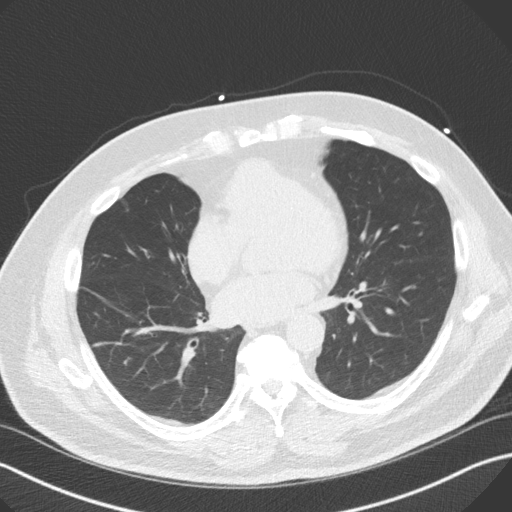
[im 30/45  lung]
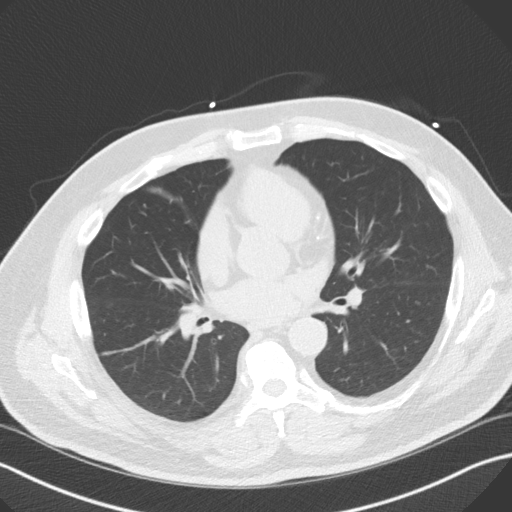
[im 35/45  lung]
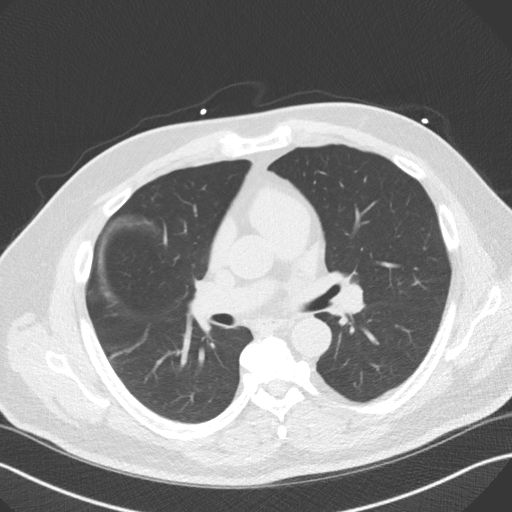
[im 40/45  lung]
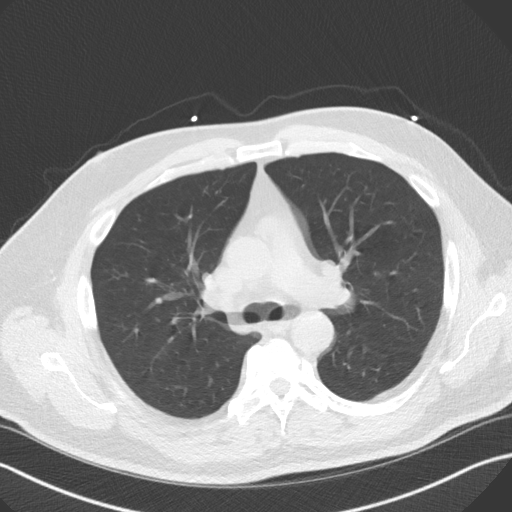

[16 of 20 positions shown; findings below may reference images not displayed]

FINDINGS: Non-cardiac: See separate report from [REDACTED].

Ascending Aorta: Normal size, mild diffuse calcifications in the
aortic arch.

Pericardium: Normal.

Coronary arteries: Normal origin.
IMPRESSION: Coronary calcium score of 278. This was 70 percentile for age and
sex matched control.

EXAM:
OVER-READ INTERPRETATION  CT CHEST

The following report is an over-read performed by radiologist Dr.
over-read does not include interpretation of cardiac or coronary
anatomy or pathology. The coronary calcium score interpretation by
the cardiologist is attached.
FINDINGS: Aortic atherosclerosis. Within the visualized portions of the thorax
there are no suspicious appearing pulmonary nodules or masses, there
is no acute consolidative airspace disease, no pleural effusions, no
pneumothorax and no lymphadenopathy. Visualized portions of the
upper abdomen are unremarkable. There are no aggressive appearing
lytic or blastic lesions noted in the visualized portions of the
skeleton.
IMPRESSION: 1.  Aortic Atherosclerosis ([id number]).

*** End of Addendum ***
FINDINGS: Non-cardiac: See separate report from [REDACTED].

Ascending Aorta: Normal size, mild diffuse calcifications in the
aortic arch.

Pericardium: Normal.

Coronary arteries: Normal origin.
IMPRESSION: Coronary calcium score of 278. This was 70 percentile for age and
sex matched control.

## 2021-03-15 ENCOUNTER — Encounter: Payer: Self-pay | Admitting: Internal Medicine

## 2021-03-15 ENCOUNTER — Ambulatory Visit (INDEPENDENT_AMBULATORY_CARE_PROVIDER_SITE_OTHER): Payer: PPO | Admitting: Internal Medicine

## 2021-03-15 ENCOUNTER — Other Ambulatory Visit: Payer: Self-pay

## 2021-03-15 VITALS — BP 128/80 | HR 88 | Temp 97.6°F | Resp 16 | Ht 72.0 in | Wt 231.6 lb

## 2021-03-15 DIAGNOSIS — E559 Vitamin D deficiency, unspecified: Secondary | ICD-10-CM | POA: Diagnosis not present

## 2021-03-15 DIAGNOSIS — Z79899 Other long term (current) drug therapy: Secondary | ICD-10-CM

## 2021-03-15 DIAGNOSIS — I700 Atherosclerosis of aorta: Secondary | ICD-10-CM

## 2021-03-15 DIAGNOSIS — E1122 Type 2 diabetes mellitus with diabetic chronic kidney disease: Secondary | ICD-10-CM | POA: Diagnosis not present

## 2021-03-15 DIAGNOSIS — Z23 Encounter for immunization: Secondary | ICD-10-CM | POA: Diagnosis not present

## 2021-03-15 DIAGNOSIS — E785 Hyperlipidemia, unspecified: Secondary | ICD-10-CM

## 2021-03-15 DIAGNOSIS — I10 Essential (primary) hypertension: Secondary | ICD-10-CM | POA: Diagnosis not present

## 2021-03-15 DIAGNOSIS — N182 Chronic kidney disease, stage 2 (mild): Secondary | ICD-10-CM

## 2021-03-15 DIAGNOSIS — E1169 Type 2 diabetes mellitus with other specified complication: Secondary | ICD-10-CM | POA: Diagnosis not present

## 2021-03-15 NOTE — Patient Instructions (Addendum)
Recommend the book "The END of DIETING" by Dr Monico Hoar   & the book "The END of DIABETES " by Dr Monico Hoar  At [email or web address] - get book & Audio CD's     Being diabetic has a  300% increased risk for heart attack, stroke, cancer, and alzheimer- type vascular dementia. It is very important that you work harder with diet by avoiding all foods that are white. Avoid white rice (brown & wild rice is OK), white potatoes (sweetpotatoes in moderation is OK), White bread or wheat bread or anything made out of white flour like bagels, donuts, rolls, buns, biscuits, cakes, pastries, cookies, pizza crust, and pasta (made from white flour & egg whites) - vegetarian pasta or spinach or wheat pasta is OK. Multigrain breads like Arnold's or Pepperidge Farm, or multigrain sandwich thins or flatbreads.  Diet, exercise and weight loss can reverse and cure diabetes in the early stages.  Diet, exercise and weight loss is very important in the control and prevention of complications of diabetes which affects every system in your body, ie. Brain - dementia/stroke, eyes - glaucoma/blindness, heart - heart attack/heart failure, kidneys - dialysis, stomach - gastric paralysis, intestines - malabsorption, nerves - severe painful neuritis, circulation - gangrene & loss of a leg(s), and finally cancer and Alzheimers.    I recommend avoid fried & greasy foods,  sweets/candy, white rice (brown or wild rice or Quinoa is OK), white potatoes (sweet potatoes are OK) - anything made from white flour - bagels, doughnuts, rolls, buns, biscuits,white and wheat breads, pizza crust and traditional pasta made of white flour & egg white(vegetarian pasta or spinach or wheat pasta is OK).  Multi-grain bread is OK - like multi-grain flat bread or sandwich thins. Avoid alcohol in excess. Exercise is also important.    Eat all the vegetables you want - avoid meat, especially red meat and dairy - especially cheese.  Cheese is the most  concentrated form of trans-fats which is the worst thing to clog up our arteries. Veggie cheese is OK which can be found in the fresh produce section at [facility] or [employer] or [employer]  ++++++++++++++++++++++++++  Due to recent changes in healthcare laws, you may see the results of your imaging and laboratory studies on MyChart before your provider has had a chance to review them.  We understand that in some cases there may be results that are confusing or concerning to you. Not all laboratory results come back in the same time frame and the provider may be waiting for multiple results in order to interpret others.  Please give [place] 48 hours in order for your provider to thoroughly review all the results before contacting the office for clarification of your results.  ++++++++++++++++++++++++++++++++++  Vit D  & Vit C 1,000 mg   are recommended to help protect  against the Covid-19 and other Corona viruses.    Also it's recommended  to take  Zinc 50 mg  to help  protect against the Covid-19   and best place to get  is also on [employer].com  and don't pay more than 6-8 cents /pill !   ===================================== Coronavirus (COVID-19) Are you at risk?  Are you at risk for the Coronavirus (COVID-19)?  To be considered HIGH RISK for Coronavirus (COVID-19), you have to meet the following criteria:  Traveled to [place], [place], [place], [place] or [place]; or in the [place] to [place], [place], [place]  or [place];  and have fever, cough, and shortness of breath within the last 2 weeks of travel OR Been in close contact with a person diagnosed with COVID-19 within the last 2 weeks and have  fever, cough,and shortness of breath  IF YOU DO NOT MEET THESE CRITERIA, YOU ARE CONSIDERED LOW RISK FOR COVID-19.  What to do if you are HIGH RISK for COVID-19?  If you are having a medical emergency, call 911. Seek medical care right away. Before you go to a doctor's  office, urgent care or emergency department,  call ahead and tell them about your recent travel, contact with someone diagnosed with COVID-19   and your symptoms.  You should receive instructions from your physician's office regarding next steps of care.  When you arrive at healthcare provider, tell the healthcare staff immediately you have returned from  visiting [place], [place], [place], [place] or [place]; or traveled in the [place] to [place], [place],  [place] or [place] in the last two weeks or you have been in close contact with a person diagnosed with  COVID-19 in the last 2 weeks.   Tell the health care staff about your symptoms: fever, cough and shortness of breath. After you have been seen by a medical provider, you will be either: Tested for (COVID-19) and discharged home on quarantine except to seek medical care if  symptoms worsen, and asked to  Stay home and avoid contact with others until you get your results (4-5 days)  Avoid travel on public transportation if possible (such as bus, train, or airplane) or Sent to the Emergency Department by EMS for evaluation, COVID-19 testing  and  possible admission depending on your condition and test results.  What to do if you are LOW RISK for COVID-19?  Reduce your risk of any infection by using the same precautions used for avoiding the common cold or flu:  Wash your hands often with soap and warm water for at least 20 seconds.  If soap and water are not readily available,  use an alcohol-based hand sanitizer with at least 60% alcohol.  If coughing or sneezing, cover your mouth and nose by coughing or sneezing into the elbow areas of your shirt or coat,  into a tissue or into your sleeve (not your hands). Avoid shaking hands with others and consider head nods or verbal greetings only. Avoid touching your eyes, nose, or mouth with unwashed hands.  Avoid close contact with people who are sick. Avoid places or events with  large numbers of people in one location, like concerts or sporting events. Carefully consider travel plans you have or are making. If you are planning any travel outside or inside the [place], visit the CDC's Travelers' Health webpage for the latest health notices. If you have some symptoms but not all symptoms, continue to monitor at home and seek medical attention  if your symptoms worsen. If you are having a medical emergency, call 911.   ++++++++++++++++++++++++++++++++ Recommend Adult Low Dose Aspirin or  coated  Aspirin 81 mg daily  To reduce risk of Colon Cancer 40 %,  Skin Cancer 26 % ,  Melanoma 46%  and  Pancreatic cancer 60% ++++++++++++++++++++++++++++++++ Vitamin D goal  is between 70-100.  Please make sure that you are taking your Vitamin D as directed.  It is very important as a natural anti-inflammatory  helping hair, skin, and nails, as well as reducing stroke and heart attack risk.  It helps your bones and helps  with mood. It also decreases numerous cancer risks so please take it as directed.  Low Vit D is associated with a 200-300% higher risk for CANCER  and 200-300% higher risk for HEART   ATTACK  &  STROKE.   .....................................Marland Kitchen It is also associated with higher death rate at younger ages,  autoimmune diseases like Rheumatoid arthritis, Lupus, Multiple Sclerosis.    Also many other serious conditions, like depression, Alzheimer's Dementia, infertility, muscle aches, fatigue, fibromyalgia - just to name a few. ++++++++++++++++++++ Recommend the book "The END of DIETING" by Dr Monico Hoar  & the book "The END of DIABETES " by Dr Monico Hoar At [email or web address] - get book & Audio CD's    Being diabetic has a  300% increased risk for heart attack, stroke, cancer, and alzheimer- type vascular dementia. It is very important that you work harder with diet by avoiding all foods that are white. Avoid white rice (brown & wild rice is OK), white potatoes  (sweetpotatoes in moderation is OK), White bread or wheat bread or anything made out of white flour like bagels, donuts, rolls, buns, biscuits, cakes, pastries, cookies, pizza crust, and pasta (made from white flour & egg whites) - vegetarian pasta or spinach or wheat pasta is OK. Multigrain breads like Arnold's or Pepperidge Farm, or multigrain sandwich thins or flatbreads.  Diet, exercise and weight loss can reverse and cure diabetes in the early stages.  Diet, exercise and weight loss is very important in the control and prevention of complications of diabetes which affects every system in your body, ie. Brain - dementia/stroke, eyes - glaucoma/blindness, heart - heart attack/heart failure, kidneys - dialysis, stomach - gastric paralysis, intestines - malabsorption, nerves - severe painful neuritis, circulation - gangrene & loss of a leg(s), and finally cancer and Alzheimers.    I recommend avoid fried & greasy foods,  sweets/candy, white rice (brown or wild rice or Quinoa is OK), white potatoes (sweet potatoes are OK) - anything made from white flour - bagels, doughnuts, rolls, buns, biscuits,white and wheat breads, pizza crust and traditional pasta made of white flour & egg white(vegetarian pasta or spinach or wheat pasta is OK).  Multi-grain bread is OK - like multi-grain flat bread or sandwich thins. Avoid alcohol in excess. Exercise is also important.    Eat all the vegetables you want - avoid meat, especially red meat and dairy - especially cheese.  Cheese is the most concentrated form of trans-fats which is the worst thing to clog up our arteries. Veggie cheese is OK which can be found in the fresh produce section at [employer] or [employer] or [employer]  +++++++++++++++++++++ DASH Eating Plan  DASH stands for "Dietary Approaches to Stop Hypertension."   The DASH eating plan is a healthy eating plan that has been shown to reduce high blood pressure (hypertension). Additional health benefits  may include reducing the risk of type 2 diabetes mellitus, heart disease, and stroke. The DASH eating plan may also help with weight loss. WHAT DO I NEED TO KNOW ABOUT THE DASH EATING PLAN? For the DASH eating plan, you will follow these general guidelines: Choose foods with a percent daily value for sodium of less than 5% (as listed on the food label). Use salt-free seasonings or herbs instead of table salt or sea salt. Check with your health care provider or pharmacist before using salt substitutes. Eat lower-sodium products, often labeled as "lower sodium" or "no salt added." Eat fresh foods. Eat more vegetables, fruits,  and low-fat dairy products. Choose whole grains. Look for the word "whole" as the first word in the ingredient list. Choose fish  Limit sweets, desserts, sugars, and sugary drinks. Choose heart-healthy fats. Eat veggie cheese  Eat more home-cooked food and less restaurant, buffet, and fast food. Limit fried foods. Cook foods using methods other than frying. Limit canned vegetables. If you do use them, rinse them well to decrease the sodium. When eating at a restaurant, ask that your food be prepared with less salt, or no salt if possible.                      WHAT FOODS CAN I EAT? Read Dr Francis Dowse Fuhrman's books on The End of Dieting & The End of Diabetes  Grains Whole grain or whole wheat bread. Brown rice. Whole grain or whole wheat pasta. Quinoa, bulgur, and whole grain cereals. Low-sodium cereals. Corn or whole wheat flour tortillas. Whole grain cornbread. Whole grain crackers. Low-sodium crackers.  Vegetables Fresh or frozen vegetables (raw, steamed, roasted, or grilled). Low-sodium or reduced-sodium tomato and vegetable juices. Low-sodium or reduced-sodium tomato sauce and paste. Low-sodium or reduced-sodium canned vegetables.   Fruits All fresh, canned (in natural juice), or frozen fruits.  Protein Products  All fish and seafood.  Dried beans, peas, or  lentils. Unsalted nuts and seeds. Unsalted canned beans.  Dairy Low-fat dairy products, such as skim or 1% milk, 2% or reduced-fat cheeses, low-fat ricotta or cottage cheese, or plain low-fat yogurt. Low-sodium or reduced-sodium cheeses.  Fats and Oils Tub margarines without trans fats. Light or reduced-fat mayonnaise and salad dressings (reduced sodium). Avocado. Safflower, olive, or canola oils. Natural peanut or almond butter.  Other Unsalted popcorn and pretzels. The items listed above may not be a complete list of recommended foods or beverages. Contact your dietitian for more options.  +++++++++++++++  WHAT FOODS ARE NOT RECOMMENDED? Grains/ White flour or wheat flour White bread. White pasta. White rice. Refined cornbread. Bagels and croissants. Crackers that contain trans fat.  Vegetables  Creamed or fried vegetables. Vegetables in a . Regular canned vegetables. Regular canned tomato sauce and paste. Regular tomato and vegetable juices.  Fruits Dried fruits. Canned fruit in light or heavy syrup. Fruit juice.  Meat and Other Protein Products Meat in general - RED meat & White meat.  Fatty cuts of meat. Ribs, chicken wings, all processed meats as bacon, sausage, bologna, salami, fatback, hot dogs, bratwurst and packaged luncheon meats.  Dairy Whole or 2% milk, cream, half-and-half, and cream cheese. Whole-fat or sweetened yogurt. Full-fat cheeses or blue cheese. Non-dairy creamers and whipped toppings. Processed cheese, cheese spreads, or cheese curds.  Condiments Onion and garlic salt, seasoned salt, table salt, and sea salt. Canned and packaged gravies. Worcestershire sauce. Tartar sauce. Barbecue sauce. Teriyaki sauce. Soy sauce, including reduced sodium. Steak sauce. Fish sauce. Oyster sauce. Cocktail sauce. Horseradish. Ketchup and mustard. Meat flavorings and tenderizers. Bouillon cubes. Hot sauce. Tabasco sauce. Marinades. Taco seasonings. Relishes.  Fats and  Oils Butter, stick margarine, lard, shortening and bacon fat. Coconut, palm kernel, or palm oils. Regular salad dressings.  Pickles and olives. Salted popcorn and pretzels.  The items listed above may not be a complete list of foods and beverages to avoid.

## 2021-03-15 NOTE — Progress Notes (Signed)
Future Appointments  Date Time Provider [place]  03/15/2021  4:00 PM Unk Pinto, MD [facility] None  06/06/2021  3:00 PM Unk Pinto, MD [facility] None  12/29/2021  4:00 PM Liane Comber, NP [facility] None    History of Present Illness:       This very nice 70 y.o.  MBM presents for late 6 month follow up with HTN, HLD, Pre-Diabetes and Vitamin D Deficiency.  Chest CT scan on 11/05/2018 showed Aortic Atherosclerosis.        Patient is treated for HTN  (1989) & BP has been controlled at home. Today's BP is at goal -  128/80. Patient has had no complaints of any cardiac type chest pain, palpitations, dyspnea / orthopnea / PND, dizziness, claudication, or dependent edema.       Hyperlipidemia is controlled with diet & Rosuvastatin. Patient denies myalgias or other med SE's. Last Lipids were at goal:  Lab Results  Component Value Date   CHOL 128 01/26/2021   HDL 36 (L) 01/26/2021   LDLCALC 71 01/26/2021   TRIG 119 01/26/2021   CHOLHDL 3.6 01/26/2021     Also, the patient has Moderate Obesity  (BMI 31.6)  and history of T2_NIDDM (2007)  w/CKD1  and has had no symptoms of reactive hypoglycemia, diabetic polys, paresthesias or visual blurring.  Last A1c was not at goal :  Lab Results  Component Value Date   HGBA1C 10.0 (H) 01/26/2021                                                           Further, the patient also has history of Vitamin D Deficiency ("22" /2008 & "34" /2019) and supplements vitamin D without any suspected side-effects. Last vitamin D was at goal :   Lab Results  Component Value Date   VD25OH 94 01/26/2021     Current Outpatient Medications on File Prior to Visit  Medication Sig   aspirin 81 MG tablet Take  daily.   atenolol 100 MG tablet Take  1 tablet  Daily  for BP   citalopram 40 MG tablet TAKE ONE TABLET  DAILY    losartan-hctz 100-25 MG tablet Take  1 tablet  Daily     Magnesium 400 MG CAPS Takes 2 capsules Daily    metFORMIN -XR 500 MG 24 hr tablet Take  2 tablets  2 x /day  with Meals  for Diabetes   rosuvastatin  20 MG tablet TAKE ONE TABLET  DAILY    VITAMIN D PO Take 10,000 Units daily.    zinc 50 MG tablet Take  daily.    No Known Allergies   PMHx:   Past Medical History:  Diagnosis Date   Allergy    Anxiety    On Xanax   Chest pain    Fatigue    GERD (gastroesophageal reflux disease)    Heart murmur    hx of MVP    HTN (hypertension)    Hyperlipemia    IBS (irritable bowel syndrome)    Medication management 06/13/2013   Mitral valve prolapse    Obesity (BMI 30.0-34.9) 08/20/2015   OSA (obstructive sleep apnea)    Restated CPAP   Sleep apnea    no cpap    T2_NIDDM  Testosterone deficiency 08/20/2015   Type II or unspecified type diabetes mellitus without mention of complication, not stated as uncontrolled    Vitamin D deficiency      Immunization History  Administered Date(s) Administered   Influenza Inj Mdck Quad  01/29/2017   Influenza Split 03/01/2015   Influenza, High Dose   04/23/2019, 03/08/2020, 03/15/2021   Influenza,inj,quad 03/03/2016   Moderna Sars-Covid-2 Vacc 06/15/2019, 07/13/2019, 05/03/2020   PPD Test  01/29/2017, 03/19/2018, 07/28/2020   Pneumococcal -13 03/19/2018   Pneumococcal -23 05/21/2008, 04/23/2019   Td 10/30/2003   Tdap 01/30/2014     Past Surgical History:  Procedure Laterality Date   COLONOSCOPY     TONSILLECTOMY     WISDOM TOOTH EXTRACTION      FHx:    Reviewed / unchanged  SHx:    Reviewed / unchanged   Systems Review:  Constitutional: Denies fever, chills, wt changes, headaches, insomnia, fatigue, night sweats, change in appetite. Eyes: Denies redness, blurred vision, diplopia, discharge, itchy, watery eyes.  ENT: Denies discharge, congestion, post nasal drip, epistaxis, sore throat, earache, hearing loss, dental pain, tinnitus, vertigo, sinus pain, snoring.  CV: Denies chest pain, palpitations, irregular heartbeat, syncope,  dyspnea, diaphoresis, orthopnea, PND, claudication or edema. Respiratory: denies cough, dyspnea, DOE, pleurisy, hoarseness, laryngitis, wheezing.  Gastrointestinal: Denies dysphagia, odynophagia, heartburn, reflux, water brash, abdominal pain or cramps, nausea, vomiting, bloating, diarrhea, constipation, hematemesis, melena, hematochezia  or hemorrhoids. Genitourinary: Denies dysuria, frequency, urgency, nocturia, hesitancy, discharge, hematuria or flank pain. Musculoskeletal: Denies arthralgias, myalgias, stiffness, jt. swelling, pain, limping or strain/sprain.  Skin: Denies pruritus, rash, hives, warts, acne, eczema or change in skin lesion(s). Neuro: No weakness, tremor, incoordination, spasms, paresthesia or pain. Psychiatric: Denies confusion, memory loss or sensory loss. Endo: Denies change in weight, skin or hair change.  Heme/Lymph: No excessive bleeding, bruising or enlarged lymph nodes.  Physical Exam  BP 128/80   Pulse 88   Temp 97.6 F (36.4 C)   Resp 16   Ht 6' (1.829 m)   Wt 231 lb 9.6 oz (105.1 kg)   SpO2 97%   BMI 31.41 kg/m   Appears  well nourished, well groomed  and in no distress.  Eyes: PERRLA, EOMs, conjunctiva no swelling or erythema. Sinuses: No frontal/maxillary tenderness ENT/Mouth: EAC's clear, TM's nl w/o erythema, bulging. Nares clear w/o erythema, swelling, exudates. Oropharynx clear without erythema or exudates. Oral hygiene is good. Tongue normal, non obstructing. Hearing intact.  Neck: Supple. Thyroid not palpable. Car 2+/2+ without bruits, nodes or JVD. Chest: Respirations nl with BS clear & equal w/o rales, rhonchi, wheezing or stridor.  Cor: Heart sounds normal w/ regular rate and rhythm without sig. murmurs, gallops, clicks or rubs. Peripheral pulses normal and equal  without edema.  Abdomen: Soft & bowel sounds normal. Non-tender w/o guarding, rebound, hernias, masses or organomegaly.  Lymphatics: Unremarkable.  Musculoskeletal: Full ROM all  peripheral extremities, joint stability, 5/5 strength and normal gait.  Skin: Warm, dry without exposed rashes, lesions or ecchymosis apparent.  Neuro: Cranial nerves intact, reflexes equal bilaterally. Sensory-motor testing grossly intact. Tendon reflexes grossly intact.  Pysch: Alert & oriented x 3.  Insight and judgement nl & appropriate. No ideations.  Assessment and Plan:  1. Essential hypertension  - Continue diet/meds, exercise,& lifestyle modifications.  - Continue monitor periodic cholesterol/liver & renal functions   2. Hyperlipidemia associated with type 2 diabetes mellitus ([place])  - Continue medication, monitor blood pressure at home.  - Continue DASH diet.  Reminder to go  to the ER if any CP,  SOB, nausea, dizziness, severe HA, changes vision/speech.     3. Type 2 diabetes mellitus with stage 2 chronic kidney  disease, without long-term current use of insulin (HCC)  - Long discussion with patient re: elevated A1c.   Encouraged better diet and recc Trulicity  for combined better control & weight loss.                                                                                                                                                                                                                                          - Continue supplementation.    5. Aortic atherosclerosis (HCC) by Chest CT scan 06.23.2020   6. Need for immunization against influenza  - Flu vaccine HIGH DOSE PF (Fluzone High dose)  7. Medication management         Discussed  regular exercise, BP monitoring, weight control to achieve/maintain BMI less than 25 and discussed med and SE's. Recommended labs to assess and monitor clinical status with further disposition pending results of labs.  I discussed the assessment and treatment plan with the patient. The patient was provided an opportunity to ask questions and all were answered. The patient agreed with the plan and demonstrated an  understanding of the instructions.  I provided over 30 minutes of exam, counseling, chart review and  complex critical decision making.        The patient was advised to call back or seek an in-person evaluation if the symptoms worsen or if the condition fails to improve as anticipated.   Marinus Maw, MD

## 2021-03-22 ENCOUNTER — Other Ambulatory Visit: Payer: Self-pay | Admitting: Internal Medicine

## 2021-03-22 ENCOUNTER — Telehealth: Payer: Self-pay

## 2021-03-22 DIAGNOSIS — E1122 Type 2 diabetes mellitus with diabetic chronic kidney disease: Secondary | ICD-10-CM

## 2021-03-22 MED ORDER — ONETOUCH VERIO VI STRP: ORAL_STRIP | 4 refills | Status: DC

## 2021-03-22 MED ORDER — FREESTYLE LIBRE 14 DAY SENSOR MISC: 3 refills | Status: DC

## 2021-03-22 MED ORDER — TIRZEPATIDE 2.5 MG/0.5ML ~~LOC~~ SOAJ: 2.5000 mg | SUBCUTANEOUS | 0 refills | Status: DC

## 2021-03-22 NOTE — Telephone Encounter (Signed)
PA submitted today through CoverMyMeds for [facility]

## 2021-03-23 ENCOUNTER — Telehealth: Payer: Self-pay | Admitting: Internal Medicine

## 2021-03-23 NOTE — Chronic Care Management (AMB) (Signed)
  Chronic Care Management   Outreach Note  03/23/2021 Name: Wayne [name] MRN: [id number] DOB: 15-Jan-1951  Referred by: Lucky Cowboy, MD Reason for referral : No chief complaint on file.   An unsuccessful telephone outreach was attempted today. The patient was referred to the pharmacist for assistance with care management and care coordination.   Follow Up Plan:   Tatjana Dellinger Upstream Scheduler

## 2021-03-25 ENCOUNTER — Telehealth: Payer: Self-pay | Admitting: Internal Medicine

## 2021-03-25 NOTE — Progress Notes (Signed)
  Chronic Care Management   Outreach Note  03/25/2021 Name: CAM DAUPHIN MRN: [id number] DOB: 1950/11/27  Referred by: Lucky Cowboy, MD Reason for referral : No chief complaint on file.   A second unsuccessful telephone outreach was attempted today. The patient was referred to pharmacist for assistance with care management and care coordination.  Follow Up Plan:   Tatjana Dellinger Upstream Scheduler

## 2021-03-27 ENCOUNTER — Other Ambulatory Visit: Payer: Self-pay | Admitting: Adult Health

## 2021-03-27 DIAGNOSIS — E782 Mixed hyperlipidemia: Secondary | ICD-10-CM

## 2021-03-30 ENCOUNTER — Telehealth: Payer: Self-pay | Admitting: Internal Medicine

## 2021-03-30 NOTE — Progress Notes (Signed)
  Chronic Care Management   Outreach Note  03/30/2021 Name: Wayne [name] MRN: [id number] DOB: 24-Oct-1950  Referred by: Lucky Cowboy, MD Reason for referral : No chief complaint on file.   Third unsuccessful telephone outreach was attempted today. The patient was referred to the pharmacist for assistance with care management and care coordination.   Follow Up Plan:   Tatjana Dellinger Upstream Scheduler

## 2021-05-03 ENCOUNTER — Other Ambulatory Visit: Payer: Self-pay | Admitting: Internal Medicine

## 2021-05-05 ENCOUNTER — Other Ambulatory Visit: Payer: Self-pay | Admitting: Internal Medicine

## 2021-05-05 DIAGNOSIS — E1122 Type 2 diabetes mellitus with diabetic chronic kidney disease: Secondary | ICD-10-CM

## 2021-05-05 MED ORDER — TIRZEPATIDE 2.5 MG/0.5ML ~~LOC~~ SOAJ
2.5000 mg | SUBCUTANEOUS | 0 refills | Status: DC
Start: 2021-05-05 — End: 2021-06-06

## 2021-05-12 ENCOUNTER — Other Ambulatory Visit: Payer: Self-pay | Admitting: Internal Medicine

## 2021-05-12 DIAGNOSIS — I10 Essential (primary) hypertension: Secondary | ICD-10-CM

## 2021-05-12 DIAGNOSIS — E119 Type 2 diabetes mellitus without complications: Secondary | ICD-10-CM

## 2021-06-04 ENCOUNTER — Other Ambulatory Visit: Payer: Self-pay | Admitting: Internal Medicine

## 2021-06-04 MED ORDER — CITALOPRAM HYDROBROMIDE 40 MG PO TABS: ORAL_TABLET | ORAL | 3 refills | Status: DC

## 2021-06-05 NOTE — Progress Notes (Signed)
Annual  Screening/Preventative Visit  & Comprehensive Evaluation & Examination  Future Appointments  Date Time Provider Department  06/06/2021  3:00 PM Wayne Pinto, MD [facility]  12/29/2021  4:00 PM Wayne Comber, NP [facility]  06/08/2022  3:00 PM Wayne Pinto, MD [facility]            This very nice 71 y.o. MBM  presents for a Screening /Preventative Visit & comprehensive evaluation and management of multiple medical co-morbidities.  Patient has been followed for HTN, HLD, T2_NIDDM  and Vitamin D Deficiency. Chest CT scan in June 2020 showed Aortic Atherosclerosis.        HTN predates since  1989.  Patient's BP has been controlled at home.  Today's BP is at goal - 114/70. Patient denies any cardiac symptoms as chest pain, palpitations, shortness of breath, dizziness or ankle swelling.       Patient's hyperlipidemia is controlled with diet and g Crestor Patient denies myalgias or other medication SE's. Last lipids were at goal :  Lab Results  Component Value Date   CHOL 128 01/26/2021   HDL 36 (L) 01/26/2021   LDLCALC 71 01/26/2021   TRIG 119 01/26/2021   CHOL /HDL 3.6 01/26/2021        Patient has moderate obesity (BMI 31+)  and consequent T2_NIDDM (2007)  w/CKD1. Patient has ben on Metformin & 2 months ago , started on [facility] &  has already lost 13 # . Patient denies reactive hypoglycemic symptoms, visual blurring, diabetic polys or paresthesias. Last A1c was not at goal :   Lab Results  Component Value Date   HGBA1C 10.0 (H) 01/26/2021                                                  Patient has prior  hx/o Low Testosterone ("209" /2015 and "154" /2016) and he declined treatment.       Finally, patient has history of Vitamin D Deficiency ("22" /2008 and "34" /2019)    and last vitamin D was at goal :   Lab Results  Component Value Date   VD25OH 94 01/26/2021     Current Outpatient Medications on File Prior to Visit  Medication Sig   aspirin 81 MG  tablet Take  daily.   atenolol (TENORMIN) 100 MG tablet TAKE ONE TABLET DAILY    citalopram (CELEXA) 40 MG tablet Take  1 tablet  Daily    FLONASE  nasal spray Place 1 spray into both nostrils in the morning and at bedtime.    losartan-hctz 100-25 MG tablet Take  1 tablet  Daily     Magnesium 400 MG CAPS Takes 2 capsules Daily   metFORMIN -XR 500 MG  TAKE TWO TABLETS  TWICE A DAY    montelukast (SINGULAIR) 10 MG tablet Take 1 tablet Daily for Allergies   rosuvastatin (CRESTOR) 20 MG tablet Take  1 tablet  Daily  for Cholesterol     tirzepatide (MOUNJARO) 2.5 MG/0.5ML Pen Inject 2.5 mg into the skin once a week.   VITAMIN D PO Take 10,000 Units daily.    zinc 50 MG tablet Take daily.    No Known Allergies   Past Medical History:  Diagnosis Date   Allergy    Anxiety    On Xanax   Chest pain    Fatigue  GERD (gastroesophageal reflux disease)    Heart murmur    hx of MVP    HTN (hypertension)    Hyperlipemia    IBS (irritable bowel syndrome)    Medication management 06/13/2013   Mitral valve prolapse    Obesity (BMI 30.0-34.9) 08/20/2015   OSA (obstructive sleep apnea)    Restated CPAP   Sleep apnea    no cpap    T2_NIDDM    Testosterone deficiency 08/20/2015   Type II  type diabetes mellitus     Vitamin D deficiency      Health Maintenance  Topic Date Due   Zoster Vaccines- Shingrix (1 of 2) Never done   FOOT EXAM  04/22/2020   COVID-19 Vaccine (4 - Booster for Moderna series) 06/28/2020   HEMOGLOBIN A1C  07/26/2021   OPHTHALMOLOGY EXAM  08/10/2021   COLONOSCOPY (Pts 45-4yrs Insurance coverage will need to be confirmed)  03/30/2022   TETANUS/TDAP  01/31/2024   Pneumonia Vaccine 4+ Years old  Completed   INFLUENZA VACCINE  Completed   Hepatitis C Screening  Completed   HPV VACCINES  Aged Out     Immunization History  Administered Date(s) Administered   Influenza Inj Mdck Quad  01/29/2017   Influenza Split 03/01/2015   Influenza, High Dose  03/19/2018,  04/23/2019, 03/08/2020, 03/15/2021   Influenza,inj,quad 03/03/2016   Moderna Sars-Covid-2 Vacc 06/15/2019, 07/13/2019, 05/03/2020   PPD Test 01/30/2014, 01/29/2017, 03/19/2018, 07/28/2020   Pneumococcal -13 03/19/2018   Pneumococcal -23 05/21/2008, 04/23/2019   Td 10/30/2003   Tdap 01/30/2014    Last Colon -  03/30/2017 - Dr Wayne [name] >(-) Colonoscopy /(+) FHx Colon Ca Father /Recc 5 yr f./u Colon in Nov 2023  Past Surgical History:  Procedure Laterality Date   COLONOSCOPY     TONSILLECTOMY     WISDOM TOOTH EXTRACTION       Family History  Problem Relation Age of Onset   Stroke Other    Hypertension Other    Hyperlipidemia Other    Diabetes Other    Colon cancer Father 68   Hypertension Father    Dementia Mother    Alzheimer's disease Mother    Colon polyps Neg Hx    Esophageal cancer Neg Hx    Rectal cancer Neg Hx    Stomach cancer Neg Hx      Social History   Tobacco Use   Smoking status: Former    Packs/day: 1.00    Years: 19.00    Pack years: 19.00    Types: Cigarettes    Start date: 22    Quit date: 09/06/2007    Years since quitting: 13.7   Smokeless tobacco: Never  Vaping Use   Vaping Use: Never used  Substance Use Topics   Alcohol use: Yes    Comment: occasional   Drug use: No      ROS Constitutional: Denies fever, chills, weight loss/gain, headaches, insomnia,  night sweats or change in appetite. Does c/o fatigue. Eyes: Denies redness, blurred vision, diplopia, discharge, itchy or watery eyes.  ENT: Denies discharge, congestion, post nasal drip, epistaxis, sore throat, earache, hearing loss, dental pain, Tinnitus, Vertigo, Sinus pain or snoring.  Cardio: Denies chest pain, palpitations, irregular heartbeat, syncope, dyspnea, diaphoresis, orthopnea, PND, claudication or edema Respiratory: denies cough, dyspnea, DOE, pleurisy, hoarseness, laryngitis or wheezing.  Gastrointestinal: Denies dysphagia, heartburn, reflux, water brash, pain, cramps,  nausea, vomiting, bloating, diarrhea, constipation, hematemesis, melena, hematochezia, jaundice or hemorrhoids Genitourinary: Denies dysuria, frequency, discharge, hematuria or flank pain.  Has urgency, nocturia x 2-3 & occasional hesitancy. Musculoskeletal: Denies arthralgia, myalgia, stiffness, Jt. Swelling, pain, limp or strain/sprain. Denies Falls. Skin: Denies puritis, rash, hives, warts, acne, eczema or change in skin lesion Neuro: No weakness, tremor, incoordination, spasms, paresthesia or pain Psychiatric: Denies confusion, memory loss or sensory loss. Denies Depression. Endocrine: Denies change in weight, skin, hair change, nocturia, and paresthesia, diabetic polys, visual blurring or hyper / hypo glycemic episodes.  Heme/Lymph: No excessive bleeding, bruising or enlarged lymph nodes.   Physical Exam  BP 114/70    Pulse 100    Temp 97.9 F (36.6 C)    Resp 16    Ht 6' (1.829 m)    Wt 218 lb (98.9 kg)    SpO2 95%    BMI 29.57 kg/m   General Appearance: Well nourished and well groomed and in no apparent distress.  Eyes: PERRLA, EOMs, conjunctiva no swelling or erythema, normal fundi and vessels. Sinuses: No frontal/maxillary tenderness ENT/Mouth: EACs patent / TMs  nl. Nares clear without erythema, swelling, mucoid exudates. Oral hygiene is good. No erythema, swelling, or exudate. Tongue normal, non-obstructing. Tonsils not swollen or erythematous. Hearing normal.  Neck: Supple, thyroid not palpable. No bruits, nodes or JVD. Respiratory: Respiratory effort normal.  BS equal and clear bilateral without rales, rhonci, wheezing or stridor. Cardio: Heart sounds are normal with regular rate and rhythm and no murmurs, rubs or gallops. Peripheral pulses are normal and equal bilaterally without edema. No aortic or femoral bruits. Chest: symmetric with normal excursions and percussion.  Abdomen: Soft, with Nl bowel sounds. Nontender, no guarding, rebound, hernias, masses, or organomegaly.   Lymphatics: Non tender without lymphadenopathy.  Musculoskeletal: Full ROM all peripheral extremities, joint stability, 5/5 strength, and normal gait. Skin: Warm and dry without rashes, lesions, cyanosis, clubbing or  ecchymosis.  Neuro: Cranial nerves intact, reflexes equal bilaterally. Normal muscle tone, no cerebellar symptoms. Sensation intact.  Pysch: Alert and oriented X 3 with normal affect, insight and judgment appropriate.   Assessment and Plan  1. Annual Preventative/Screening Exam   1. Encounter for general adult medical examination with abnormal findings   2. Essential hypertension  - EKG 12-Lead - [place], RETROPERITNL ABD,  LTD - Urinalysis, Routine w reflex microscopic - Microalbumin / creatinine urine ratio - CBC with Differential/Platelet - COMPLETE METABOLIC PANEL WITH GFR - Magnesium - TSH  3. Hyperlipidemia associated with type 2 diabetes mellitus (HCC)  - EKG 12-Lead - [place], RETROPERITNL ABD,  LTD - Lipid panel - TSH  4. Type 2 diabetes mellitus with stage 1 chronic kidney disease, without long-term current use of insulin (HCC)  - EKG 12-Lead - [place], RETROPERITNL ABD,  LTD - HM DIABETES FOOT EXAM - PR LOW EXTEMITY NEUR EXAM DOCUM - Hemoglobin A1c - Insulin, random  5. Vitamin D deficiency  - Urinalysis, Routine w reflex microscopic - Microalbumin / creatinine urine ratio - VITAMIN D 25 Hydroxy (Vit-D Deficiency, Fractures)  6. Aortic atherosclerosis (HCC) by Chest CT scan 06.23.2020  - EKG 12-Lead - [place], RETROPERITNL ABD,  LTD - Lipid panel  7. BPH with obstruction/lower urinary tract symptoms  - PSA  8. Prostate cancer screening  - PSA  9. Screening for ischemic heart disease  - EKG 12-Lead  10. Former smoker  - EKG 12-Lead - [place], RETROPERITNL ABD,  LTD  11. FH: hypertension  - EKG 12-Lead - [place], RETROPERITNL ABD,  LTD  12. Screening for AAA (aortic abdominal aneurysm)  - [place], RETROPERITNL ABD,  LTD  13.  Medication  management  - Urinalysis, Routine w reflex microscopic - Microalbumin / creatinine urine ratio - CBC with Differential/Platelet - COMPLETE METABOLIC PANEL WITH GFR - Magnesium - Lipid panel - TSH - Hemoglobin A1c - Insulin, random - VITAMIN D 25 Hydroxy           Patient was counseled in prudent diet, weight control to achieve/maintain BMI less than 25, BP monitoring, regular exercise and medications as discussed.  Discussed med effects and SE's. Routine screening labs and tests as requested with regular follow-up as recommended. Over 40 minutes of exam, counseling, chart review and high complex critical decision making was performed   Kirtland Bouchard, MD

## 2021-06-06 ENCOUNTER — Other Ambulatory Visit: Payer: Self-pay

## 2021-06-06 ENCOUNTER — Ambulatory Visit (INDEPENDENT_AMBULATORY_CARE_PROVIDER_SITE_OTHER): Payer: PPO | Admitting: Internal Medicine

## 2021-06-06 ENCOUNTER — Encounter: Payer: Self-pay | Admitting: Internal Medicine

## 2021-06-06 VITALS — BP 114/70 | HR 100 | Temp 97.9°F | Resp 16 | Ht 72.0 in | Wt 218.0 lb

## 2021-06-06 DIAGNOSIS — E1169 Type 2 diabetes mellitus with other specified complication: Secondary | ICD-10-CM | POA: Diagnosis not present

## 2021-06-06 DIAGNOSIS — Z0000 Encounter for general adult medical examination without abnormal findings: Secondary | ICD-10-CM | POA: Diagnosis not present

## 2021-06-06 DIAGNOSIS — Z136 Encounter for screening for cardiovascular disorders: Secondary | ICD-10-CM

## 2021-06-06 DIAGNOSIS — N181 Chronic kidney disease, stage 1: Secondary | ICD-10-CM

## 2021-06-06 DIAGNOSIS — Z87891 Personal history of nicotine dependence: Secondary | ICD-10-CM

## 2021-06-06 DIAGNOSIS — Z125 Encounter for screening for malignant neoplasm of prostate: Secondary | ICD-10-CM | POA: Diagnosis not present

## 2021-06-06 DIAGNOSIS — E785 Hyperlipidemia, unspecified: Secondary | ICD-10-CM

## 2021-06-06 DIAGNOSIS — I700 Atherosclerosis of aorta: Secondary | ICD-10-CM | POA: Diagnosis not present

## 2021-06-06 DIAGNOSIS — E1122 Type 2 diabetes mellitus with diabetic chronic kidney disease: Secondary | ICD-10-CM | POA: Diagnosis not present

## 2021-06-06 DIAGNOSIS — N401 Enlarged prostate with lower urinary tract symptoms: Secondary | ICD-10-CM | POA: Diagnosis not present

## 2021-06-06 DIAGNOSIS — Z79899 Other long term (current) drug therapy: Secondary | ICD-10-CM

## 2021-06-06 DIAGNOSIS — E559 Vitamin D deficiency, unspecified: Secondary | ICD-10-CM | POA: Diagnosis not present

## 2021-06-06 DIAGNOSIS — N138 Other obstructive and reflux uropathy: Secondary | ICD-10-CM | POA: Diagnosis not present

## 2021-06-06 DIAGNOSIS — I10 Essential (primary) hypertension: Secondary | ICD-10-CM

## 2021-06-06 DIAGNOSIS — Z8249 Family history of ischemic heart disease and other diseases of the circulatory system: Secondary | ICD-10-CM

## 2021-06-06 DIAGNOSIS — Z0001 Encounter for general adult medical examination with abnormal findings: Secondary | ICD-10-CM

## 2021-06-06 MED ORDER — TIRZEPATIDE 7.5 MG/0.5ML ~~LOC~~ SOAJ: 7.5000 mg | SUBCUTANEOUS | 0 refills | Status: DC

## 2021-06-06 MED ORDER — TRULICITY 3 MG/0.5ML ~~LOC~~ SOAJ: 3.0000 mg | SUBCUTANEOUS | 0 refills | Status: DC

## 2021-06-06 NOTE — Patient Instructions (Signed)

## 2021-06-07 ENCOUNTER — Encounter: Payer: Self-pay | Admitting: Internal Medicine

## 2021-06-07 ENCOUNTER — Other Ambulatory Visit: Payer: Self-pay | Admitting: Internal Medicine

## 2021-06-07 DIAGNOSIS — N181 Chronic kidney disease, stage 1: Secondary | ICD-10-CM

## 2021-06-07 LAB — MAGNESIUM: Magnesium: 1.9 mg/dL (ref 1.5–2.5)

## 2021-06-07 LAB — MICROALBUMIN / CREATININE URINE RATIO
Creatinine, Urine: 238 mg/dL (ref 20–320)
Microalb Creat Ratio: 5 mcg/mg creat (ref <30)
Microalb, Ur: 1.2 mg/dL

## 2021-06-07 LAB — VITAMIN D 25 HYDROXY (VIT D DEFICIENCY, FRACTURES): Vit D, 25-Hydroxy: 110 ng/mL — ABNORMAL HIGH (ref 30–100)

## 2021-06-07 LAB — CBC WITH DIFFERENTIAL/PLATELET
Absolute Monocytes: 415 cells/uL (ref 200–950)
Basophils Absolute: 67 cells/uL (ref 0–200)
Basophils Relative: 1 %
Eosinophils Absolute: 201 cells/uL (ref 15–500)
Eosinophils Relative: 3 %
HCT: 39.2 % (ref 38.5–50.0)
Hemoglobin: 13.2 g/dL (ref 13.2–17.1)
Lymphs Abs: 3933 cells/uL — ABNORMAL HIGH (ref 850–3900)
MCH: 30.1 pg (ref 27.0–33.0)
MCHC: 33.7 g/dL (ref 32.0–36.0)
MCV: 89.5 fL (ref 80.0–100.0)
MPV: 10.7 fL (ref 7.5–12.5)
Monocytes Relative: 6.2 %
Neutro Abs: 2084 cells/uL (ref 1500–7800)
Neutrophils Relative %: 31.1 %
Platelets: 275 10*3/uL (ref 140–400)
RBC: 4.38 10*6/uL (ref 4.20–5.80)
RDW: 12.8 % (ref 11.0–15.0)
Total Lymphocyte: 58.7 %
WBC: 6.7 10*3/uL (ref 3.8–10.8)

## 2021-06-07 LAB — URINALYSIS, ROUTINE W REFLEX MICROSCOPIC
Bacteria, UA: NONE SEEN /HPF
Bilirubin Urine: NEGATIVE
Glucose, UA: NEGATIVE
Hgb urine dipstick: NEGATIVE
Hyaline Cast: NONE SEEN /LPF
Ketones, ur: NEGATIVE
Leukocytes,Ua: NEGATIVE
Nitrite: NEGATIVE
RBC / HPF: NONE SEEN /HPF (ref 0–2)
Specific Gravity, Urine: 1.029 (ref 1.001–1.035)
Squamous Epithelial / HPF: NONE SEEN /HPF (ref <=5)
pH: 5.5 (ref 5.0–8.0)

## 2021-06-07 LAB — COMPLETE METABOLIC PANEL WITH GFR
AG Ratio: 1.6 (calc) (ref 1.0–2.5)
ALT: 24 U/L (ref 9–46)
AST: 23 U/L (ref 10–35)
Albumin: 4.5 g/dL (ref 3.6–5.1)
Alkaline phosphatase (APISO): 57 U/L (ref 35–144)
BUN: 15 mg/dL (ref 7–25)
CO2: 32 mmol/L (ref 20–32)
Calcium: 10.2 mg/dL (ref 8.6–10.3)
Chloride: 101 mmol/L (ref 98–110)
Creat: 0.83 mg/dL (ref 0.70–1.35)
Globulin: 2.9 g/dL (calc) (ref 1.9–3.7)
Glucose, Bld: 88 mg/dL (ref 65–99)
Potassium: 4.1 mmol/L (ref 3.5–5.3)
Sodium: 138 mmol/L (ref 135–146)
Total Bilirubin: 0.6 mg/dL (ref 0.2–1.2)
Total Protein: 7.4 g/dL (ref 6.1–8.1)
eGFR: 95 mL/min/{1.73_m2} (ref >=60)

## 2021-06-07 LAB — LIPID PANEL
Cholesterol: 110 mg/dL (ref <200)
HDL: 37 mg/dL — ABNORMAL LOW (ref >=40)
LDL Cholesterol (Calc): 58 mg/dL (calc)
Non-HDL Cholesterol (Calc): 73 mg/dL (calc) (ref <130)
Total CHOL/HDL Ratio: 3 (calc) (ref <5.0)
Triglycerides: 72 mg/dL (ref <150)

## 2021-06-07 LAB — TSH: TSH: 2.67 mIU/L (ref 0.40–4.50)

## 2021-06-07 LAB — PSA: PSA: 1.13 ng/mL (ref <=4.00)

## 2021-06-07 LAB — INSULIN, RANDOM: Insulin: 12.2 u[IU]/mL

## 2021-06-07 LAB — HEMOGLOBIN A1C
Hgb A1c MFr Bld: 7.4 % of total Hgb — ABNORMAL HIGH (ref <5.7)
Mean Plasma Glucose: 166 mg/dL
eAG (mmol/L): 9.2 mmol/L

## 2021-06-07 MED ORDER — TRULICITY 3 MG/0.5ML ~~LOC~~ SOAJ: 3.0000 mg | SUBCUTANEOUS | 0 refills | Status: DC

## 2021-06-07 NOTE — Progress Notes (Signed)
============================================================ °-   Test results slightly outside the reference range are not unusual. If there is anything important, I will review this with you,  otherwise it is considered normal test values.  If you have further questions,  please do not hesitate to contact me at the office or via My Chart.  ============================================================ ============================================================  -  Total Chol = 110     &   LDL Chol = 58    -     Both  Excellent   - Very low risk for Heart Attack  / Stroke ============================================================ ============================================================  -  A1c is much better - Down from 10.0%   to now 7.4%  - Goal is less than 5.7%  ============================================================ ============================================================  -  PSA - Normal  - Great  !  ============================================================ ============================================================  -  All Else - CBC - Kidneys - Electrolytes - Liver - Magnesium & Thyroid    - all  Normal / OK ============================================================ ============================================================

## 2021-07-05 ENCOUNTER — Other Ambulatory Visit: Payer: Self-pay | Admitting: Internal Medicine

## 2021-07-05 MED ORDER — TRULICITY 4.5 MG/0.5ML ~~LOC~~ SOAJ: SUBCUTANEOUS | 0 refills | Status: DC

## 2021-07-27 NOTE — Progress Notes (Deleted)
Assessment and Plan: ? ?There are no diagnoses linked to this encounter. ? ? ? ?Further disposition pending results of labs. Discussed med's effects and SE's.   ?Over 30 minutes of exam, counseling, chart review, and critical decision making was performed.  ? ?Future Appointments  ?Date Time Provider Department Center  ?07/28/2021 10:30 AM Faizah Kandler, Loma Sousa, NP [facility] None  ?09/14/2021  4:00 PM Judd Gaudier, NP [facility] None  ?12/29/2021  4:00 PM Revonda Humphrey, NP [facility] None  ?04/03/2022  4:00 PM Lucky Cowboy, MD [facility] None  ?07/04/2022  3:00 PM Lucky Cowboy, MD [facility] None  ? ? ?------------------------------------------------------------------------------------------------------------------ ? ? ?HPI ?There were no vitals taken for this visit. ?71 y.o.male presents for ? ?Past Medical History:  ?Diagnosis Date  ? Allergy   ? Anxiety   ? On Xanax  ? Chest pain   ? Fatigue   ? GERD (gastroesophageal reflux disease)   ? Heart murmur   ? hx of MVP   ? HTN (hypertension)   ? Hyperlipemia   ? IBS (irritable bowel syndrome)   ? Medication management 06/13/2013  ? Mitral valve prolapse   ? Obesity (BMI 30.0-34.9) 08/20/2015  ? OSA (obstructive sleep apnea)   ? Restated CPAP  ? Sleep apnea   ? no cpap   ? T2_NIDDM   ? Testosterone deficiency 08/20/2015  ? Type II or unspecified type diabetes mellitus without mention of complication, not stated as uncontrolled   ? Vitamin D deficiency   ?  ? ?No Known Allergies ? ?Current Outpatient Medications on File Prior to Visit  ?Medication Sig  ? aspirin 81 MG tablet Take 81 mg by mouth daily.  ? atenolol (TENORMIN) 100 MG tablet TAKE ONE TABLET BY MOUTH DAILY FOR BLOOD PRESSURE  ? citalopram (CELEXA) 40 MG tablet Take  1 tablet  Daily for Mood                                                                 /                                TAKE BY MOUTH DAILY  ? Continuous Blood Gluc Sensor (FREESTYLE LIBRE 14 DAY SENSOR) MISC USE TO CHECK BLOOD SUGARS THREE TIMES A DAY  AS DIRECTED  ? Dulaglutide (TRULICITY) 4.5 MG/0.5ML SOPN Inject 4.5 mg into the Skin Once  Weekly   for Diabetes  ( Dx: e11.29 )  ? fluticasone (FLONASE) 50 MCG/ACT nasal spray Place 1 spray into both nostrils in the morning and at bedtime. Spray upward and outward toward corners of eyes (Patient not taking: Reported on 06/06/2021)  ? glucose blood (ONETOUCH VERIO) test strip Check blood sugar 1 time a day-DX-E11.22.  ? losartan-hydrochlorothiazide (HYZAAR) 100-25 MG tablet Take  1 tablet  Daily  for BP & Fluid Retention / Ankle Swelling  ? Magnesium 400 MG CAPS Take 1 capsuile Daily (Patient taking differently: 500 mg. Takes 2 capsules Daily)  ? metFORMIN (GLUCOPHAGE-XR) 500 MG 24 hr tablet TAKE TWO TABLETS BY MOUTH TWICE A DAY WITH A MEAL FOR DIABETES  ? montelukast (SINGULAIR) 10 MG tablet Take 1 tablet Daily for Allergies  ? rosuvastatin (CRESTOR) 20 MG tablet Take  1 tablet  Daily  for Cholesterol                                             /                     TAKE 1 TABLET BY MOUTH  ? VITAMIN D PO Take 10,000 Units by mouth daily.   ? zinc gluconate 50 MG tablet Take 50 mg by mouth daily.  ? ?No current facility-administered medications on file prior to visit.  ? ? ?ROS: all negative except above.  ? ?Physical Exam: ? ?There were no vitals taken for this visit. ? ?General Appearance: Well nourished, in no apparent distress. ?Eyes: PERRLA, EOMs, conjunctiva no swelling or erythema ?Sinuses: No Frontal/maxillary tenderness ?ENT/Mouth: Ext aud canals clear, TMs without erythema, bulging. No erythema, swelling, or exudate on post pharynx.  Tonsils not swollen or erythematous. Hearing normal.  ?Neck: Supple, thyroid normal.  ?Respiratory: Respiratory effort normal, BS equal bilaterally without rales, rhonchi, wheezing or stridor.  ?Cardio: RRR with no MRGs. Brisk peripheral pulses without edema.  ?Abdomen: Soft, + BS.  Non tender, no guarding, rebound, hernias, masses. ?Lymphatics: Non tender without  lymphadenopathy.  ?Musculoskeletal: Full ROM, 5/5 strength, normal gait.  ?Skin: Warm, dry without rashes, lesions, ecchymosis.  ?Neuro: Cranial nerves intact. Normal muscle tone, no cerebellar symptoms. Sensation intact.  ?Psych: Awake and oriented X 3, normal affect, Insight and Judgment appropriate.  ?  ? ?Revonda Humphrey, NP ?12:05 PM ?[facility] ? ?

## 2021-07-28 ENCOUNTER — Ambulatory Visit: Payer: PPO | Admitting: Nurse Practitioner

## 2021-08-01 NOTE — Progress Notes (Signed)
Assessment and Plan: ? ?Wayne [name] was seen today for acute visit. ? ?Diagnoses and all orders for this visit: ? ?Type 2 diabetes mellitus with stage 2 chronic kidney disease, without long-term current use of insulin (HCC) ?Continue medications: Trulicity 4.5 mg SQ qw and Metformin 500 mg 2 tabs BID. Once he finishes current supply of Trulicity will try to switch to Ozempic 1 mg to see if he can achieve greater weight loss ?Continue diet and exercise.  ?Perform daily foot/skin check, notify office of any concerning changes.  ? ?Overweight ?Long discussion about weight loss, diet, and exercise ?Recommended diet heavy in fruits and veggies and low in animal meats, cheeses, and dairy products, appropriate calorie intake ?Patient will work on decreasing simple carbs such as white bread, white rice, pasta, potatoes, sugar and increase exercise ?May change to Ozempic 1 mg SQ QW after finishes current supply to achieve more weight loss ?Follow up at next visit  ? ?Acute pain of left shoulder ?Has resolved ?Monitor and if returns notify the office and will try steroid taper as cause sound like tendonitis ? ?Further disposition pending results of labs. Discussed med's effects and SE's.   ?Over 30 minutes of exam, counseling, chart review, and critical decision making was performed.  ? ?Future Appointments  ?Date Time Provider Department Center  ?09/14/2021  4:00 PM Judd Gaudierorbett, Ashley, NP [facility] None  ?12/29/2021  4:00 PM Revonda HumphreyMull, Barret Esquivel W, NP [facility] None  ?04/03/2022  4:00 PM Lucky CowboyMcKeown, William, MD [facility] None  ?07/04/2022  3:00 PM Lucky CowboyMcKeown, William, MD [facility] None  ? ? ?------------------------------------------------------------------------------------------------------------------ ? ? ?HPI ?BP 112/62   Pulse 61   Temp (!) 95.6 ?F (35.3 ?C)   Wt 217 lb 9.6 oz (98.7 kg)   SpO2 97%   BMI 29.51 kg/m?  ? ?71 y.o.male called and made appointment because he was experiencing left shoulder pain that radiated down arm to elbow.  It  has since resolved and is having no further issues ? ?BMI is Body mass index is 29.51 kg/m?., he has been working on diet and exercise. Currently on Trulicity 4.5 mg SQ QW. Not losing as much weight as he would like. Will determine if he would like to switch to Ozempic 1 mg for BP control and weight loss.  Insurance will not cover Mounjaro ?Wt Readings from Last 3 Encounters:  ?08/02/21 217 lb 9.6 oz (98.7 kg)  ?06/06/21 218 lb (98.9 kg)  ?03/15/21 231 lb 9.6 oz (105.1 kg)  ? ? ?He is currently on Metformin 500 mg 2 tab BID and Trulicity 4.5 mg SQ QW. A1c has improved from 10 to 7.4 ?Lab Results  ?Component Value Date  ? HGBA1C 7.4 (H) 06/06/2021  ?  ? ? ?Past Medical History:  ?Diagnosis Date  ? Allergy   ? Anxiety   ? On Xanax  ? Chest pain   ? Fatigue   ? GERD (gastroesophageal reflux disease)   ? Heart murmur   ? hx of MVP   ? HTN (hypertension)   ? Hyperlipemia   ? IBS (irritable bowel syndrome)   ? Medication management 06/13/2013  ? Mitral valve prolapse   ? Obesity (BMI 30.0-34.9) 08/20/2015  ? OSA (obstructive sleep apnea)   ? Restated CPAP  ? Sleep apnea   ? no cpap   ? T2_NIDDM   ? Testosterone deficiency 08/20/2015  ? Type II or unspecified type diabetes mellitus without mention of complication, not stated as uncontrolled   ? Vitamin D deficiency   ?  ? ?  No Known Allergies ? ?Current Outpatient Medications on File Prior to Visit  ?Medication Sig  ? aspirin 81 MG tablet Take 81 mg by mouth daily.  ? atenolol (TENORMIN) 100 MG tablet TAKE ONE TABLET BY MOUTH DAILY FOR BLOOD PRESSURE  ? citalopram (CELEXA) 40 MG tablet Take  1 tablet  Daily for Mood                                                                 /                                TAKE BY MOUTH DAILY  ? Dulaglutide (TRULICITY) 4.5 MG/0.5ML SOPN Inject 4.5 mg into the Skin Once  Weekly   for Diabetes  ( Dx: e11.29 )  ? Magnesium 400 MG CAPS Take 1 capsuile Daily (Patient taking differently: 500 mg. Takes 2 capsules Daily)  ? metFORMIN  (GLUCOPHAGE-XR) 500 MG 24 hr tablet TAKE TWO TABLETS BY MOUTH TWICE A DAY WITH A MEAL FOR DIABETES  ? rosuvastatin (CRESTOR) 20 MG tablet Take  1 tablet  Daily  for Cholesterol                                             /                     TAKE 1 TABLET BY MOUTH  ? VITAMIN D PO Take 10,000 Units by mouth daily.   ? zinc gluconate 50 MG tablet Take 50 mg by mouth daily.  ? Continuous Blood Gluc Sensor (FREESTYLE LIBRE 14 DAY SENSOR) MISC USE TO CHECK BLOOD SUGARS THREE TIMES A DAY AS DIRECTED  ? glucose blood (ONETOUCH VERIO) test strip Check blood sugar 1 time a day-DX-E11.22.  ? montelukast (SINGULAIR) 10 MG tablet Take 1 tablet Daily for Allergies (Patient not taking: Reported on 08/02/2021)  ? ?No current facility-administered medications on file prior to visit.  ? ? ?ROS: all negative except above.  ? ?Physical Exam: ? ?BP 112/62   Pulse 61   Temp (!) 95.6 ?F (35.3 ?C)   Wt 217 lb 9.6 oz (98.7 kg)   SpO2 97%   BMI 29.51 kg/m?  ? ?General Appearance: Well nourished, in no apparent distress. ?Eyes: PERRLA, EOMs, conjunctiva no swelling or erythema ?Sinuses: No Frontal/maxillary tenderness ?ENT/Mouth: Ext aud canals clear, TMs without erythema, bulging. No erythema, swelling, or exudate on post pharynx.  Tonsils not swollen or erythematous. Hearing normal.  ?Neck: Supple, thyroid normal.  ?Respiratory: Respiratory effort normal, BS equal bilaterally without rales, rhonchi, wheezing or stridor.  ?Cardio: RRR with no MRGs. Brisk peripheral pulses without edema.  ?Abdomen: Soft, + BS.  Non tender, no guarding, rebound, hernias, masses. ?Lymphatics: Non tender without lymphadenopathy.  ?Musculoskeletal: Full ROM, 5/5 strength, normal gait. Normal left shoulder exam ?Skin: Warm, dry without rashes, lesions, ecchymosis.  ?Neuro: Cranial nerves intact. Normal muscle tone, no cerebellar symptoms. Sensation intact.  ?Psych: Awake and oriented X 3, normal affect, Insight and Judgment appropriate.  ?  ? ?Revonda Humphrey,  NP ?4:24 PM ?  [facility] ? ?

## 2021-08-02 ENCOUNTER — Other Ambulatory Visit: Payer: Self-pay

## 2021-08-02 ENCOUNTER — Ambulatory Visit (INDEPENDENT_AMBULATORY_CARE_PROVIDER_SITE_OTHER): Payer: PPO | Admitting: Nurse Practitioner

## 2021-08-02 ENCOUNTER — Encounter: Payer: Self-pay | Admitting: Nurse Practitioner

## 2021-08-02 ENCOUNTER — Other Ambulatory Visit: Payer: Self-pay | Admitting: Internal Medicine

## 2021-08-02 VITALS — BP 112/62 | HR 61 | Temp 95.6°F | Wt 217.6 lb

## 2021-08-02 DIAGNOSIS — E1122 Type 2 diabetes mellitus with diabetic chronic kidney disease: Secondary | ICD-10-CM | POA: Diagnosis not present

## 2021-08-02 DIAGNOSIS — Z6829 Body mass index (BMI) 29.0-29.9, adult: Secondary | ICD-10-CM | POA: Diagnosis not present

## 2021-08-02 DIAGNOSIS — N182 Chronic kidney disease, stage 2 (mild): Secondary | ICD-10-CM

## 2021-08-02 DIAGNOSIS — E663 Overweight: Secondary | ICD-10-CM | POA: Diagnosis not present

## 2021-08-02 DIAGNOSIS — M25512 Pain in left shoulder: Secondary | ICD-10-CM

## 2021-08-02 DIAGNOSIS — I10 Essential (primary) hypertension: Secondary | ICD-10-CM

## 2021-09-14 ENCOUNTER — Ambulatory Visit (INDEPENDENT_AMBULATORY_CARE_PROVIDER_SITE_OTHER): Payer: PPO | Admitting: Adult Health

## 2021-09-14 ENCOUNTER — Encounter: Payer: Self-pay | Admitting: Adult Health

## 2021-09-14 VITALS — BP 110/70 | HR 80 | Temp 97.9°F | Resp 16 | Ht 72.0 in | Wt 212.6 lb

## 2021-09-14 DIAGNOSIS — E1122 Type 2 diabetes mellitus with diabetic chronic kidney disease: Secondary | ICD-10-CM | POA: Diagnosis not present

## 2021-09-14 DIAGNOSIS — E785 Hyperlipidemia, unspecified: Secondary | ICD-10-CM

## 2021-09-14 DIAGNOSIS — E1169 Type 2 diabetes mellitus with other specified complication: Secondary | ICD-10-CM

## 2021-09-14 DIAGNOSIS — E559 Vitamin D deficiency, unspecified: Secondary | ICD-10-CM

## 2021-09-14 DIAGNOSIS — K219 Gastro-esophageal reflux disease without esophagitis: Secondary | ICD-10-CM | POA: Diagnosis not present

## 2021-09-14 DIAGNOSIS — N182 Chronic kidney disease, stage 2 (mild): Secondary | ICD-10-CM | POA: Diagnosis not present

## 2021-09-14 DIAGNOSIS — I10 Essential (primary) hypertension: Secondary | ICD-10-CM | POA: Diagnosis not present

## 2021-09-14 DIAGNOSIS — E349 Endocrine disorder, unspecified: Secondary | ICD-10-CM | POA: Diagnosis not present

## 2021-09-14 DIAGNOSIS — G4733 Obstructive sleep apnea (adult) (pediatric): Secondary | ICD-10-CM | POA: Diagnosis not present

## 2021-09-14 DIAGNOSIS — I700 Atherosclerosis of aorta: Secondary | ICD-10-CM

## 2021-09-14 DIAGNOSIS — I2510 Atherosclerotic heart disease of native coronary artery without angina pectoris: Secondary | ICD-10-CM | POA: Diagnosis not present

## 2021-09-14 MED ORDER — FAMOTIDINE 20 MG PO TABS: 20.0000 mg | ORAL_TABLET | Freq: Two times a day (BID) | ORAL | 1 refills | Status: DC

## 2021-09-14 NOTE — Progress Notes (Signed)
?FOLLOW UP ? ?Assessment and Plan:  ? ?Atherosclerosis of aorta ([place])  -per CT 10/2018 ?Control blood pressure, cholesterol, glucose, increase exercise.  ? ?CAD ?Control blood pressure, cholesterol, glucose, increase exercise.  ?Est with Dr. Bettina Gavia ? ?Hypertension ?Well controlled with current medications; on ARB ?Monitor blood pressure at home; patient to call if consistently greater than 130/80 ?Continue DASH diet.   ?Reminder to go to the ER if any CP, SOB, nausea, dizziness, severe HA, changes vision/speech, left arm numbness and tingling and jaw pain. ? ?Cholesterol ?Continue statin medication for LDL goal <70 ?Continue low cholesterol diet and exercise.  ?Check lipid panel.  ? ?Diabetes with diabetic chronic kidney disease ?Continue medication: metformin, tulicity ?RESTART CHECKING SUGARS ?Continue diet and exercise.  ?Perform daily foot/skin check, notify office of any concerning changes.  ?Check A1C ? ?Anxiety ?Well managed by current regimen;  ?Stress management techniques discussed, increase water, good sleep hygiene discussed, increase exercise, and increase veggies.  ? ?Morbid obesity - BMI 30 + with OSA, T2DM ?Long discussion about weight loss, diet, and exercise ?Discussed ideal weight for height and initial weight goal ?Patient will work on portion sizes and gentle exercise ?Will follow up in 3 months ? ?Vitamin D Def/ osteoporosis prevention ?Continue supplementation; advised to reduce from 10000 IU to 7000-8000 IU, 5000 IU was too low in the past ?Defer vitamin D ? ?OSA ?Discussed risks of complications with CPAP non-compliance including cardiac arrhythmias; he requests referral back to Dr. Brett Fairy ? ?GERD ?Initiated medication for new reflux related symptoms: famotidine 20 mg BID ?Discussed diet, avoiding triggers and other lifestyle changes ? ? ?Continue diet and meds as discussed. Further disposition pending results of labs. Discussed med's effects and SE's.   ?Over 30 minutes of exam,  counseling, chart review, and critical decision making was performed.  ? ?Future Appointments  ?Date Time Provider [place]  ?12/29/2021  4:00 PM Magda Bernheim, NP [facility] None  ?04/03/2022  4:00 PM Unk Pinto, MD [facility] None  ?07/04/2022  3:00 PM Unk Pinto, MD [facility] None  ? ? ?---------------------------------------------------------------------------------------------------------------------- ? ?HPI ?71 y.o. Wayne [name]  presents for 3 month follow up on hypertension, cholesterol, T2 diabetes, CKD, vitamin D def, CAD, anxiety. ? ?he has a diagnosis of anxiety and is currently prescribed celexa 40 mg daily, reports symptoms are well controlled on current regimen.  ? ?He is on CPAP for mild/mod OSA per sleep study 10/17/2018 by Dr. Brett Fairy, reports never got machine fully adjusted, hasn't used in over 1 year.  ? ?BMI is Body mass index is 28.83 kg/m?., he has been working on diet and exercises, trying to use stairs more, less exertional dyspnea.  ?Wt Readings from Last 3 Encounters:  ?09/14/21 212 lb 9.6 oz (96.4 kg)  ?08/02/21 217 lb 9.6 oz (Wayne.7 kg)  ?06/06/21 218 lb (Wayne.9 kg)  ? ?he has a diagnosis of GERD which is currently managed by protonix 40 mg daily after poor control with H2i  ?  ?He has aortic atherosclerosis per CT 10/2018. Coronary calcium score 301, then had CTA with about 50 % at LAD per Dr. Bettina Gavia.  ? ?His blood pressure has been controlled at home (110-120s/70-80s), today their BP is BP: 110/70 ? He does workout. He denies chest pain, shortness of breath. He has had some episodes of dizziness with position changes, admits typically on days working out of the office and not hydrating well  ? ? He is on cholesterol medication (rosuvasatin 20 mg daily) and denies myalgias. His  cholesterol is at goal. The cholesterol last visit was:   ?Lab Results  ?Component Value Date  ? CHOL 110 06/06/2021  ? HDL 37 (L) 06/06/2021  ? [place] 58 06/06/2021  ? TRIG 72 06/06/2021  ? CHOLHDL 3.0  06/06/2021  ? ? He has been working on diet and exercise for T2 diabetes (on metformin 9211 mg daily, trulicity 4.5 mg/week), and denies increased appetite, nausea, paresthesia of the feet, polydipsia, polyuria, visual disturbances and vomiting.  Last A1C in the office was:  ?Lab Results  ?Component Value Date  ? HGBA1C 7.4 (H) 06/06/2021  ? ?Lab Results  ?Component Value Date  ? EGFR 95 06/06/2021  ? ?Patient is on Vitamin D supplement but remained below goal at the last check, hasn't changed dose, still taking 10000 IU daily, suggested he reduce to 7000-8000 IU daily  ?Lab Results  ?Component Value Date  ? VD25OH 110 (H) 06/06/2021  ?   ? ? ?Current Medications:  ?Current Outpatient Medications on File Prior to Visit  ?Medication Sig  ? aspirin 81 MG tablet Take 81 mg by mouth daily.  ? atenolol (TENORMIN) 100 MG tablet TAKE ONE TABLET BY MOUTH DAILY FOR BLOOD PRESSURE  ? citalopram (CELEXA) 40 MG tablet Take  1 tablet  Daily for Mood                                                                 /                                TAKE BY MOUTH DAILY  ? Continuous Blood Gluc Sensor (FREESTYLE LIBRE 14 DAY SENSOR) MISC USE TO CHECK BLOOD SUGARS THREE TIMES A DAY AS DIRECTED  ? Dulaglutide (TRULICITY) 4.5 HE/1.7EY SOPN Inject 4.5 mg into the Skin Once  Weekly   for Diabetes  ( Dx: e11.29 )  ? glucose blood (ONETOUCH VERIO) test strip Check blood sugar 1 time a day-DX-E11.22.  ? loratadine (CLARITIN) 10 MG tablet Take 10 mg by mouth daily.  ? losartan-hydrochlorothiazide (HYZAAR) 100-25 MG tablet TAKE ONE TABLET BY MOUTH ONE TIME DAILY  ? Magnesium 400 MG CAPS Take 1 capsuile Daily (Patient taking differently: 500 mg. Takes 2 capsules Daily)  ? metFORMIN (GLUCOPHAGE-XR) 500 MG 24 hr tablet TAKE TWO TABLETS BY MOUTH TWICE A DAY WITH A MEAL FOR DIABETES  ? montelukast (SINGULAIR) 10 MG tablet Take 1 tablet Daily for Allergies  ? rosuvastatin (CRESTOR) 20 MG tablet Take  1 tablet  Daily  for Cholesterol                                              /                     TAKE 1 TABLET BY MOUTH  ? VITAMIN D PO Take 10,000 Units by mouth daily.   ? zinc gluconate 50 MG tablet Take 50 mg by mouth daily.  ? ?No current facility-administered medications on file prior to visit.  ? ? ? ?Allergies: No Known Allergies  ? ?Medical  History:  ?Past Medical History:  ?Diagnosis Date  ? Allergy   ? Anxiety   ? On Xanax  ? Chest pain   ? Fatigue   ? GERD (gastroesophageal reflux disease)   ? Heart murmur   ? hx of MVP   ? HTN (hypertension)   ? Hyperlipemia   ? IBS (irritable bowel syndrome)   ? Medication management 06/13/2013  ? Mitral valve prolapse   ? Obesity (BMI 30.0-34.9) 08/20/2015  ? OSA (obstructive sleep apnea)   ? Restated CPAP  ? Sleep apnea   ? no cpap   ? T2_NIDDM   ? Testosterone deficiency 08/20/2015  ? Type II or unspecified type diabetes mellitus without mention of complication, not stated as uncontrolled   ? Vitamin D deficiency   ? ?Family history- Reviewed and unchanged ?Social history- Reviewed and unchanged ? ? ?Review of Systems:  ?Review of Systems  ?Constitutional:  Negative for malaise/fatigue and weight loss.  ?HENT:  Negative for hearing loss and tinnitus.   ?Eyes:  Negative for blurred vision and double vision.  ?Respiratory:  Negative for cough, shortness of breath and wheezing.   ?Cardiovascular:  Negative for chest pain, palpitations, orthopnea, claudication and leg swelling.  ?Gastrointestinal:  Positive for heartburn. Negative for abdominal pain, blood in stool, constipation, diarrhea, melena, nausea and vomiting.  ?Genitourinary: Negative.   ?Musculoskeletal:  Negative for joint pain and myalgias.  ?Skin:  Negative for rash.  ?Neurological:  Negative for dizziness, tingling, sensory change, weakness and headaches.  ?Endo/Heme/Allergies:  Negative for polydipsia.  ?Psychiatric/Behavioral:  Negative for depression and substance abuse. The patient is not nervous/anxious and does not have insomnia.   ?All other systems  reviewed and are negative. ? ?Physical Exam: ?BP 110/70   Pulse 80   Temp 97.9 ?F (36.6 ?C)   Resp 16   Ht 6' (1.829 m)   Wt 212 lb 9.6 oz (96.4 kg)   SpO2 96%   BMI 28.83 kg/m?  ?Wt Readings from Last 3 Encounte

## 2021-09-15 LAB — LIPID PANEL
Cholesterol: 108 mg/dL (ref <200)
HDL: 37 mg/dL — ABNORMAL LOW (ref >=40)
LDL Cholesterol (Calc): 56 mg/dL (calc)
Non-HDL Cholesterol (Calc): 71 mg/dL (calc) (ref <130)
Total CHOL/HDL Ratio: 2.9 (calc) (ref <5.0)
Triglycerides: 71 mg/dL (ref <150)

## 2021-09-15 LAB — CBC WITH DIFFERENTIAL/PLATELET
Absolute Monocytes: 454 cells/uL (ref 200–950)
Basophils Absolute: 78 cells/uL (ref 0–200)
Basophils Relative: 1.1 %
Eosinophils Absolute: 298 cells/uL (ref 15–500)
Eosinophils Relative: 4.2 %
HCT: 37.3 % — ABNORMAL LOW (ref 38.5–50.0)
Hemoglobin: 12.5 g/dL — ABNORMAL LOW (ref 13.2–17.1)
Lymphs Abs: 4303 cells/uL — ABNORMAL HIGH (ref 850–3900)
MCH: 30.7 pg (ref 27.0–33.0)
MCHC: 33.5 g/dL (ref 32.0–36.0)
MCV: 91.6 fL (ref 80.0–100.0)
MPV: 10.8 fL (ref 7.5–12.5)
Monocytes Relative: 6.4 %
Neutro Abs: 1967 cells/uL (ref 1500–7800)
Neutrophils Relative %: 27.7 %
Platelets: 283 10*3/uL (ref 140–400)
RBC: 4.07 10*6/uL — ABNORMAL LOW (ref 4.20–5.80)
RDW: 12.2 % (ref 11.0–15.0)
Total Lymphocyte: 60.6 %
WBC: 7.1 10*3/uL (ref 3.8–10.8)

## 2021-09-15 LAB — COMPLETE METABOLIC PANEL WITH GFR
AG Ratio: 1.4 (calc) (ref 1.0–2.5)
ALT: 19 U/L (ref 9–46)
AST: 21 U/L (ref 10–35)
Albumin: 4.2 g/dL (ref 3.6–5.1)
Alkaline phosphatase (APISO): 58 U/L (ref 35–144)
BUN: 22 mg/dL (ref 7–25)
CO2: 29 mmol/L (ref 20–32)
Calcium: 10.2 mg/dL (ref 8.6–10.3)
Chloride: 102 mmol/L (ref 98–110)
Creat: 0.88 mg/dL (ref 0.70–1.35)
Globulin: 3 g/dL (calc) (ref 1.9–3.7)
Glucose, Bld: 76 mg/dL (ref 65–99)
Potassium: 4.1 mmol/L (ref 3.5–5.3)
Sodium: 138 mmol/L (ref 135–146)
Total Bilirubin: 0.5 mg/dL (ref 0.2–1.2)
Total Protein: 7.2 g/dL (ref 6.1–8.1)
eGFR: 93 mL/min/{1.73_m2} (ref >=60)

## 2021-09-15 LAB — TSH: TSH: 2.1 mIU/L (ref 0.40–4.50)

## 2021-09-15 LAB — HEMOGLOBIN A1C
Hgb A1c MFr Bld: 6.3 % of total Hgb — ABNORMAL HIGH (ref <5.7)
Mean Plasma Glucose: 134 mg/dL
eAG (mmol/L): 7.4 mmol/L

## 2021-09-15 LAB — MAGNESIUM: Magnesium: 1.8 mg/dL (ref 1.5–2.5)

## 2021-09-29 ENCOUNTER — Encounter: Payer: Self-pay | Admitting: Adult Health

## 2021-10-13 ENCOUNTER — Other Ambulatory Visit: Payer: Self-pay | Admitting: Internal Medicine

## 2021-10-13 MED ORDER — TRULICITY 4.5 MG/0.5ML ~~LOC~~ SOAJ: SUBCUTANEOUS | 3 refills | Status: DC

## 2021-11-01 DIAGNOSIS — M48062 Spinal stenosis, lumbar region with neurogenic claudication: Secondary | ICD-10-CM | POA: Diagnosis not present

## 2021-11-16 ENCOUNTER — Encounter: Payer: Self-pay | Admitting: Internal Medicine

## 2021-11-20 ENCOUNTER — Other Ambulatory Visit: Payer: Self-pay | Admitting: Internal Medicine

## 2021-11-20 MED ORDER — TADALAFIL 20 MG PO TABS: ORAL_TABLET | ORAL | 2 refills | Status: DC

## 2021-11-24 ENCOUNTER — Other Ambulatory Visit: Payer: Self-pay | Admitting: Internal Medicine

## 2021-11-24 ENCOUNTER — Encounter: Payer: Self-pay | Admitting: Internal Medicine

## 2021-11-24 MED ORDER — TADALAFIL 20 MG PO TABS: ORAL_TABLET | ORAL | 2 refills | Status: DC

## 2021-12-27 ENCOUNTER — Other Ambulatory Visit: Payer: Self-pay | Admitting: Internal Medicine

## 2021-12-27 ENCOUNTER — Other Ambulatory Visit: Payer: Self-pay

## 2021-12-27 DIAGNOSIS — I10 Essential (primary) hypertension: Secondary | ICD-10-CM

## 2021-12-27 DIAGNOSIS — E119 Type 2 diabetes mellitus without complications: Secondary | ICD-10-CM

## 2021-12-27 MED ORDER — ATENOLOL 100 MG PO TABS: ORAL_TABLET | ORAL | 1 refills | Status: DC

## 2021-12-28 NOTE — Progress Notes (Deleted)
MEDICARE ANNUAL WELLNESS AND 3 MONTH FOLLOW UP  Assessment and Plan:   Encounter for Medicare Annual Wellness Due Yearly  Atherosclerosis of aorta ([place])  -per CT 10/2018 Control blood pressure, cholesterol, glucose, increase exercise.   CAD Control blood pressure, cholesterol, glucose, increase exercise.  Est with Dr. Bettina Gavia  Hypertension Well controlled with current medications; on ARB Monitor blood pressure at home; patient to call if consistently greater than 130/80 Continue DASH diet.   Reminder to go to the ER if any CP, SOB, nausea, dizziness, severe HA, changes vision/speech, left arm numbness and tingling and jaw pain.  Cholesterol Continue statin medication for LDL goal <70 Continue low cholesterol diet and exercise.  Check lipid panel.   Diabetes with diabetic chronic kidney disease Continue medication: metformin, tulicity RESTART CHECKING SUGARS Continue diet and exercise.  Perform daily foot/skin check, notify office of any concerning changes.  Check A1C  Anxiety Well managed by current regimen;  Stress management techniques discussed, increase water, good sleep hygiene discussed, increase exercise, and increase veggies.   Morbid obesity - BMI 30 + with OSA, T2DM Long discussion about weight loss, diet, and exercise Discussed ideal weight for height and initial weight goal Patient will work on portion sizes and gentle exercise Will follow up in 3 months  Vitamin D Def/ osteoporosis prevention Continue supplementation; advised to reduce from 10000 IU to 7000-8000 IU, 5000 IU was too low in the past Defer vitamin D  OSA Discussed risks of complications with CPAP non-compliance including cardiac arrhythmias; he requests referral back to Dr. Brett Fairy  GERD Initiated medication for new reflux related symptoms: famotidine 20 mg BID Discussed diet, avoiding triggers and other lifestyle changes   Continue diet and meds as discussed. Further disposition  pending results of labs. Discussed med's effects and SE's.   Over 30 minutes of exam, counseling, chart review, and critical decision making was performed.   Future Appointments  Date Time Provider [place]  12/29/2021  4:00 PM Alycia Rossetti, NP [facility] None  04/03/2022  4:00 PM Unk Pinto, MD [facility] None  07/04/2022  3:00 PM Unk Pinto, MD [facility] None  10/03/2022  4:00 PM Alycia Rossetti, NP [facility] None    Plan:   During the course of the visit the patient was educated and counseled about appropriate screening and preventive services including:   Pneumococcal vaccine  Prevnar 13 Influenza vaccine Td vaccine Screening electrocardiogram Bone densitometry screening Colorectal cancer screening Diabetes screening Glaucoma screening Nutrition counseling  Advanced directives: requested  ----------------------------------------------------------------------------------------------------------------------  HPI 71 y.o. male  presents for 3 month follow up on hypertension, cholesterol, T2 diabetes, CKD, vitamin D def, CAD, anxiety.  he has a diagnosis of anxiety and is currently prescribed celexa 40 mg daily, reports symptoms are well controlled on current regimen.   He is on CPAP for mild/mod OSA per sleep study 10/17/2018 by Dr. Brett Fairy, reports never got machine fully adjusted, hasn't used in over 1 year.   BMI is There is no height or weight on file to calculate BMI., he has been working on diet and exercises, trying to use stairs more, less exertional dyspnea.  Wt Readings from Last 3 Encounters:  09/14/21 212 lb 9.6 oz (96.4 kg)  08/02/21 217 lb 9.6 oz (98.7 kg)  06/06/21 218 lb (98.9 kg)   he has a diagnosis of GERD which is currently managed by protonix 40 mg daily after poor control with H2i    He has aortic atherosclerosis per CT 10/2018. Coronary  calcium score 301, then had CTA with about 50 % at LAD per Dr. Bettina Gavia.   His blood  pressure has been controlled at home (110-120s/70-80s), today their BP is    He does workout. He denies chest pain, shortness of breath. He has had some episodes of dizziness with position changes, admits typically on days working out of the office and not hydrating well    He is on cholesterol medication (rosuvasatin 20 mg daily) and denies myalgias. His cholesterol is at goal. The cholesterol last visit was:   Lab Results  Component Value Date   CHOL 108 09/14/2021   HDL 37 (L) 09/14/2021   LDLCALC 56 09/14/2021   TRIG 71 09/14/2021   CHOLHDL 2.9 09/14/2021    He has been working on diet and exercise for T2 diabetes (on metformin 0160 mg daily, trulicity 4.5 mg/week), and denies increased appetite, nausea, paresthesia of the feet, polydipsia, polyuria, visual disturbances and vomiting.  Last A1C in the office was:  Lab Results  Component Value Date   HGBA1C 6.3 (H) 09/14/2021   Lab Results  Component Value Date   EGFR 93 09/14/2021   Patient is on Vitamin D supplement but remained below goal at the last check, hasn't changed dose, still taking 10000 IU daily, suggested he reduce to 7000-8000 IU daily  Lab Results  Component Value Date   VD25OH 110 (H) 06/06/2021       Current Medications:  Current Outpatient Medications on File Prior to Visit  Medication Sig   aspirin 81 MG tablet Take 81 mg by mouth daily.   atenolol (TENORMIN) 100 MG tablet TAKE ONE TABLET BY MOUTH DAILY FOR BLOOD PRESSURE   citalopram (CELEXA) 40 MG tablet Take  1 tablet  Daily for Mood                                                                 /                                TAKE BY MOUTH DAILY   Continuous Blood Gluc Sensor (FREESTYLE LIBRE 14 DAY SENSOR) MISC USE TO CHECK BLOOD SUGARS THREE TIMES A DAY AS DIRECTED   Dulaglutide (TRULICITY) 4.5 FU/9.3AT SOPN Inject 4.5 mg into the Skin Once  Weekly   for Diabetes  ( Dx:   e11.29 )   famotidine (PEPCID) 20 MG tablet Take 1 tablet (20 mg total) by mouth  2 (two) times daily.   glucose blood (ONETOUCH VERIO) test strip Check blood sugar 1 time a day-DX-E11.22.   loratadine (CLARITIN) 10 MG tablet Take 10 mg by mouth daily.   losartan-hydrochlorothiazide (HYZAAR) 100-25 MG tablet TAKE ONE TABLET BY MOUTH ONE TIME DAILY   Magnesium 400 MG CAPS Take 1 capsuile Daily (Patient taking differently: 500 mg. Takes 2 capsules Daily)   metFORMIN (GLUCOPHAGE-XR) 500 MG 24 hr tablet Take  2 tablets  2 x /day with  Meals for Diabetes                                    /  TAKE                              BY                      MOUTH   montelukast (SINGULAIR) 10 MG tablet Take 1 tablet Daily for Allergies   rosuvastatin (CRESTOR) 20 MG tablet Take  1 tablet  Daily  for Cholesterol                                             /                     TAKE 1 TABLET BY MOUTH   tadalafil (CIALIS) 20 MG tablet Take 1/2 to 1 tablet every 2 to 3 days as needed for XXXX tadalafil (CIALIS) 20 MG tablet 30 tablet 2 11/20/2021 Sent to pharmacy as: Tadalafil 20 MG Oral Tablet (Cialis) E-Prescribing Status: Receipt confirmed by pharmacy ( [facility])  (11/20/2021 12:04 AM EDT)   VITAMIN D PO Take 10,000 Units by mouth daily.    zinc gluconate 50 MG tablet Take 50 mg by mouth daily.   No current facility-administered medications on file prior to visit.     Allergies: No Known Allergies   Medical History:  Past Medical History:  Diagnosis Date   Allergy    Anxiety    On Xanax   Chest pain    Fatigue    GERD (gastroesophageal reflux disease)    Heart murmur    hx of MVP    HTN (hypertension)    Hyperlipemia    IBS (irritable bowel syndrome)    Medication management 06/13/2013   Mitral valve prolapse    Obesity (BMI 30.0-34.9) 08/20/2015   OSA (obstructive sleep apnea)    Restated CPAP   Sleep apnea    no cpap    T2_NIDDM    Testosterone deficiency 08/20/2015   Type II or unspecified type diabetes mellitus without mention of complication,  not stated as uncontrolled    Vitamin D deficiency    Family history- Reviewed and unchanged Social history- Reviewed and unchanged  Health Maintenance  Topic Date Due   Zoster Vaccines- Shingrix (1 of 2) Never done   COVID-19 Vaccine (4 - Moderna risk series) 06/28/2020   OPHTHALMOLOGY EXAM  08/10/2021   INFLUENZA VACCINE  12/13/2021   HEMOGLOBIN A1C  03/17/2022   COLONOSCOPY (Pts 45-46yr Insurance coverage will need to be confirmed)  03/30/2022   FOOT EXAM  06/06/2022   TETANUS/TDAP  01/31/2024   Pneumonia Vaccine 71 Years old  Completed   Hepatitis C Screening  Completed   HPV VACCINES  Aged Out   MEDICARE WELLNESS OBJECTIVES: Physical activity:   Cardiac risk factors:   Depression/mood screen:      06/06/2021   12:09 AM  Depression screen PHQ 2/9  Decreased Interest 0  Down, Depressed, Hopeless 0  PHQ - 2 Score 0    ADLs:     06/06/2021   12:09 AM  In your present state of health, do you have any difficulty performing the following activities:  Hearing? 0  Vision? 0  Difficulty concentrating or making decisions? 0  Walking or climbing stairs? 0  Dressing or bathing? 0  Doing errands, shopping? 0  Cognitive Testing  Alert? Yes  Normal Appearance?Yes  Oriented to person? Yes  Place? Yes   Time? Yes  Recall of three objects?  Yes  Can perform simple calculations? Yes  Displays appropriate judgment?Yes  Can read the correct time from a watch face?Yes  EOL planning:       Review of Systems:  Review of Systems  Constitutional:  Negative for malaise/fatigue and weight loss.  HENT:  Negative for hearing loss and tinnitus.   Eyes:  Negative for blurred vision and double vision.  Respiratory:  Negative for cough, shortness of breath and wheezing.   Cardiovascular:  Negative for chest pain, palpitations, orthopnea, claudication and leg swelling.  Gastrointestinal:  Positive for heartburn. Negative for abdominal pain, blood in stool, constipation, diarrhea,  melena, nausea and vomiting.  Genitourinary: Negative.   Musculoskeletal:  Negative for joint pain and myalgias.  Skin:  Negative for rash.  Neurological:  Negative for dizziness, tingling, sensory change, weakness and headaches.  Endo/Heme/Allergies:  Negative for polydipsia.  Psychiatric/Behavioral:  Negative for depression and substance abuse. The patient is not nervous/anxious and does not have insomnia.   All other systems reviewed and are negative.   Physical Exam: There were no vitals taken for this visit. Wt Readings from Last 3 Encounters:  09/14/21 212 lb 9.6 oz (96.4 kg)  08/02/21 217 lb 9.6 oz (98.7 kg)  06/06/21 218 lb (98.9 kg)   General Appearance: Well nourished, in no apparent distress. Eyes: PERRLA, EOMs, conjunctiva no swelling or erythema Sinuses: No Frontal/maxillary tenderness ENT/Mouth: Ext aud canals clear, TMs without erythema, bulging. No erythema, swelling, or exudate on post pharynx.  Tonsils not swollen or erythematous. Hearing normal.  Neck: Supple, thyroid normal.  Respiratory: Respiratory effort normal, BS equal bilaterally without rales, rhonchi, wheezing or stridor.  Cardio: RRR with no MRGs. Brisk peripheral pulses without edema.  Abdomen: Soft, + BS.  Mild epigastric tenderness, no guarding, rebound, hernias, masses. Lymphatics: Non tender without lymphadenopathy.  Musculoskeletal: Full ROM, 5/5 strength, Normal gait Skin: Warm, dry without rashes, lesions, ecchymosis.  Neuro: Cranial nerves intact. No cerebellar symptoms.  Psych: Awake and oriented X 3, normal affect, Insight and Judgment appropriate.    Medicare Attestation I have personally reviewed: The patient's medical and social history Their use of alcohol, tobacco or illicit drugs Their current medications and supplements The patient's functional ability including ADLs,fall risks, home safety risks, cognitive, and hearing and visual impairment Diet and physical activities Evidence  for depression or mood disorders  The patient's weight, height, BMI, and visual acuity have been recorded in the chart.  I have made referrals, counseling, and provided education to the patient based on review of the above and I have provided the patient with a written personalized care plan for preventive services.     Alycia Rossetti, NP 12:55 PM [facility]

## 2021-12-29 ENCOUNTER — Ambulatory Visit: Payer: PPO | Admitting: Adult Health

## 2021-12-29 ENCOUNTER — Ambulatory Visit: Payer: PPO | Admitting: Nurse Practitioner

## 2021-12-29 NOTE — Progress Notes (Signed)
MEDICARE ANNUAL WELLNESS AND 3 MONTH FOLLOW UP  Assessment and Plan:   Encounter for Medicare Annual Wellness Due Yearly Will schedule Ophthalmology exam  Atherosclerosis of aorta ([place])  -per CT 10/2018 Control blood pressure, cholesterol, glucose, increase exercise.   CAD Control blood pressure, cholesterol, glucose, increase exercise.  Est with Dr. Bettina Gavia  Hypertension Well controlled with current medications; on ARB Monitor blood pressure at home; patient to call if consistently greater than 130/80 Continue DASH diet.   Reminder to go to the ER if any CP, SOB, nausea, dizziness, severe HA, changes vision/speech, left arm numbness and tingling and jaw pain.  Hyperlipidemia assoc with Type 2 DM Continue statin medication for LDL goal <70 Continue low cholesterol diet and exercise.  Check lipid panel.   Erectile Dysfunction related to Type 2 Diabetes mellitus Continue cialis, if no improvement will refer to urologist  Diabetes with diabetic chronic kidney disease Continue medication: metformin, tulicity RESTART CHECKING SUGARS Continue diet and exercise.  Perform daily foot/skin check, notify office of any concerning changes.  Check A1C  CKD stage 2 related to Type 2 DM Increase fluids, avoid NSAIDS, monitor sugars, will monitor -CMP - Microalbumin/creatinine urine ratio  Anxiety Well managed by current regimen;  Stress management techniques discussed, increase water, good sleep hygiene discussed, increase exercise, and increase veggies.   Overweight Long discussion about weight loss, diet, and exercise Discussed ideal weight for height and initial weight goal Patient will work on portion sizes and gentle exercise Will follow up in 3 months  Vitamin D Def/ osteoporosis prevention Continue supplementation; advised to reduce from 10000 IU to 7000-8000 IU, 5000 IU was too low in the past Check vitamin D  OSA Discussed risks of complications with CPAP  non-compliance including cardiac arrhythmias; he requests referral back to Dr. Brett Fairy  GERD Initiated medication for new reflux related symptoms: famotidine 20 mg BID Discussed diet, avoiding triggers and other lifestyle changes  Medication Management Continued  Continue diet and meds as discussed. Further disposition pending results of labs. Discussed med's effects and SE's.   Over 30 minutes of exam, counseling, chart review, and critical decision making was performed.   Future Appointments  Date Time Provider [place]  04/03/2022  4:00 PM Unk Pinto, MD [facility] None  07/04/2022  3:00 PM Unk Pinto, MD [facility] None  10/03/2022  4:00 PM Alycia Rossetti, NP [facility] None    Plan:   During the course of the visit the patient was educated and counseled about appropriate screening and preventive services including:   Pneumococcal vaccine  Prevnar 13 Influenza vaccine Td vaccine Screening electrocardiogram Bone densitometry screening Colorectal cancer screening Diabetes screening Glaucoma screening Nutrition counseling  Advanced directives: requested  ----------------------------------------------------------------------------------------------------------------------  HPI 71 y.o. male  presents for 3 month follow up on hypertension, cholesterol, T2 diabetes, CKD, vitamin D def, CAD, anxiety.  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  He is enjoying this and keeps him active.   he has a diagnosis of anxiety and is currently prescribed celexa 40 mg daily, reports symptoms are well controlled on current regimen.   He is on CPAP for mild/mod OSA per sleep study 10/17/2018 by Dr. Brett Fairy, reports never got machine fully adjusted, hasn't used in over 1 year.   BMI is Body mass index is 28.7 kg/m., he has been working on diet and exercises, trying to use stairs more, less exertional dyspnea.  Wt Readings from Last 3 Encounters:  12/30/21 211  lb 9.6 oz (96  kg)  09/14/21 212 lb 9.6 oz (96.4 kg)  08/02/21 217 lb 9.6 oz (98.7 kg)   he has a diagnosis of GERD which is currently managed by protonix 40 mg daily after poor control with H2i   He has been having headaches since starting the Cialis. Excedrin does help relieve headaches.  He is not having more erections since starting the Cialis but has only been on for a short period of time.    He has aortic atherosclerosis per CT 10/2018. Coronary calcium score 301, then had CTA with about 50 % at LAD per Dr. Bettina Gavia.   His blood pressure has been controlled at home (110-120s/70-80s), today their BP is BP: 100/62  BP Readings from Last 3 Encounters:  12/30/21 100/62  09/14/21 110/70  08/02/21 112/62   He does workout. He denies chest pain, shortness of breath. He has had some episodes of dizziness with position changes, admits typically on days working out of the office and not hydrating well    He is on cholesterol medication (rosuvasatin 20 mg daily) and denies myalgias. His cholesterol is at goal. The cholesterol last visit was:   Lab Results  Component Value Date   CHOL 108 09/14/2021   HDL 37 (L) 09/14/2021   LDLCALC 56 09/14/2021   TRIG 71 09/14/2021   CHOLHDL 2.9 09/14/2021    He has been working on diet and exercise for T2 diabetes (on metformin 3299 mg daily, trulicity 4.5 mg/week), and denies increased appetite, nausea, paresthesia of the feet, polydipsia, polyuria, visual disturbances and vomiting.   Blood sugars are not being checked.  Has had some constipation on Trulicity, has not been drinking enough water.  He knows he is due for ophthalmology exam and plans to schedule has been 2 years. Last A1C in the office was:  Lab Results  Component Value Date   HGBA1C 6.3 (H) 09/14/2021   Lab Results  Component Value Date   EGFR 93 09/14/2021   Patient is on Vitamin D supplement but remained below goal at the last check, hasn't changed dose, still taking 10000 IU daily,  suggested he reduce to 7000-8000 IU daily  Lab Results  Component Value Date   VD25OH 110 (H) 06/06/2021       Current Medications:  Current Outpatient Medications on File Prior to Visit  Medication Sig   aspirin 81 MG tablet Take 81 mg by mouth daily.   atenolol (TENORMIN) 100 MG tablet TAKE ONE TABLET BY MOUTH DAILY FOR BLOOD PRESSURE   citalopram (CELEXA) 40 MG tablet Take  1 tablet  Daily for Mood                                                                 /                                TAKE BY MOUTH DAILY   Dulaglutide (TRULICITY) 4.5 ME/2.6ST SOPN Inject 4.5 mg into the Skin Once  Weekly   for Diabetes  ( Dx:   e11.29 )   losartan-hydrochlorothiazide (HYZAAR) 100-25 MG tablet TAKE ONE TABLET BY MOUTH ONE TIME DAILY   Magnesium 400 MG CAPS Take 1 capsuile Daily (Patient taking differently:  500 mg. Takes 2 capsules Daily)   metFORMIN (GLUCOPHAGE-XR) 500 MG 24 hr tablet Take  2 tablets  2 x /day with  Meals for Diabetes                                    /                                    TAKE                              BY                      MOUTH   raNITIdine HCl (ZANTAC PO) Take by mouth.   rosuvastatin (CRESTOR) 20 MG tablet Take  1 tablet  Daily  for Cholesterol                                             /                     TAKE 1 TABLET BY MOUTH   tadalafil (CIALIS) 20 MG tablet Take 1/2 to 1 tablet every 2 to 3 days as needed for XXXX tadalafil (CIALIS) 20 MG tablet 30 tablet 2 11/20/2021 Sent to pharmacy as: Tadalafil 20 MG Oral Tablet (Cialis) E-Prescribing Status: Receipt confirmed by pharmacy ( [facility])  (11/20/2021 12:04 AM EDT)   VITAMIN D PO Take 10,000 Units by mouth daily.    zinc gluconate 50 MG tablet Take 50 mg by mouth daily.   Continuous Blood Gluc Sensor (FREESTYLE LIBRE 14 DAY SENSOR) MISC USE TO CHECK BLOOD SUGARS THREE TIMES A DAY AS DIRECTED   famotidine (PEPCID) 20 MG tablet Take 1 tablet (20 mg total) by mouth 2 (two) times daily. (Patient not taking:  Reported on 12/30/2021)   glucose blood (ONETOUCH VERIO) test strip Check blood sugar 1 time a day-DX-E11.22.   loratadine (CLARITIN) 10 MG tablet Take 10 mg by mouth daily. (Patient not taking: Reported on 12/30/2021)   montelukast (SINGULAIR) 10 MG tablet Take 1 tablet Daily for Allergies (Patient not taking: Reported on 12/30/2021)   No current facility-administered medications on file prior to visit.     Allergies: No Known Allergies   Medical History:  Past Medical History:  Diagnosis Date   Allergy    Anxiety    On Xanax   Chest pain    Fatigue    GERD (gastroesophageal reflux disease)    Heart murmur    hx of MVP    HTN (hypertension)    Hyperlipemia    IBS (irritable bowel syndrome)    Medication management 06/13/2013   Mitral valve prolapse    Obesity (BMI 30.0-34.9) 08/20/2015   OSA (obstructive sleep apnea)    Restated CPAP   Sleep apnea    no cpap    T2_NIDDM    Testosterone deficiency 08/20/2015   Type II or unspecified type diabetes mellitus without mention of complication, not stated as uncontrolled    Vitamin D deficiency    Family history- Reviewed and unchanged Social history- Reviewed and unchanged  Health Maintenance  Topic  Date Due   INFLUENZA VACCINE  12/13/2021   COVID-19 Vaccine (4 - Moderna risk series) 01/15/2022 (Originally 06/28/2020)   OPHTHALMOLOGY EXAM  04/01/2022 (Originally 08/10/2021)   Zoster Vaccines- Shingrix (1 of 2) 04/01/2022 (Originally 05/16/1969)   HEMOGLOBIN A1C  03/17/2022   COLONOSCOPY (Pts 45-67yr Insurance coverage will need to be confirmed)  03/30/2022   FOOT EXAM  06/06/2022   TETANUS/TDAP  01/31/2024   Pneumonia Vaccine 71 Years old  Completed   Hepatitis C Screening  Completed   HPV VACCINES  Aged Out   Immunization History  Administered Date(s) Administered   Influenza Inj Mdck Quad With Preservative 01/29/2017   Influenza Split 03/01/2015   Influenza, High Dose Seasonal PF 03/19/2018, 04/23/2019, 03/08/2020,  03/15/2021   Influenza,inj,quad, With Preservative 03/03/2016   Moderna Sars-Covid-2 Vaccination 06/15/2019, 07/13/2019, 05/03/2020   PPD Test 01/30/2014, 01/29/2017, 03/19/2018, 07/28/2020   Pneumococcal Conjugate-13 03/19/2018   Pneumococcal Polysaccharide-23 05/21/2008, 04/23/2019   Td 10/30/2003   Tdap 01/30/2014    MEDICARE WELLNESS OBJECTIVES: Physical activity: Current Exercise Habits: Home exercise routine, Type of exercise: walking, Time (Minutes): 30, Frequency (Times/Week): 2, Weekly Exercise (Minutes/Week): 60, Intensity: Mild, Exercise limited by: orthopedic condition(s) Cardiac risk factors: Cardiac Risk Factors include: diabetes mellitus;hypertension;dyslipidemia;male gender Depression/mood screen:      12/30/2021   11:19 AM  Depression screen PHQ 2/9  Decreased Interest 0  Down, Depressed, Hopeless 0  PHQ - 2 Score 0    ADLs:     12/30/2021   11:19 AM 06/06/2021   12:09 AM  In your present state of health, do you have any difficulty performing the following activities:  Hearing? 0 0  Vision? 0 0  Difficulty concentrating or making decisions? 0 0  Walking or climbing stairs? 0 0  Dressing or bathing? 0 0  Doing errands, shopping? 0 0     Cognitive Testing  Alert? Yes  Normal Appearance?Yes  Oriented to person? Yes  Place? Yes   Time? Yes  Recall of three objects?  Yes  Can perform simple calculations? Yes  Displays appropriate judgment?Yes  Can read the correct time from a watch face?Yes  EOL planning: Does Patient Have a Medical Advance Directive?: No Would patient like information on creating a medical advance directive?: No - Patient declined     Review of Systems:  Review of Systems  Constitutional:  Negative for malaise/fatigue and weight loss.  HENT:  Negative for hearing loss and tinnitus.   Eyes:  Negative for blurred vision and double vision.  Respiratory:  Negative for cough, shortness of breath and wheezing.   Cardiovascular:  Negative  for chest pain, palpitations, orthopnea, claudication and leg swelling.  Gastrointestinal:  Positive for heartburn. Negative for abdominal pain, blood in stool, constipation, diarrhea, melena, nausea and vomiting.  Genitourinary: Negative.        Erectile dysfunction- difficulty obtaining erection  Musculoskeletal:  Negative for joint pain and myalgias.  Skin:  Negative for rash.  Neurological:  Negative for dizziness, tingling, sensory change, weakness and headaches.  Endo/Heme/Allergies:  Negative for polydipsia.  Psychiatric/Behavioral:  Negative for depression and substance abuse. The patient is not nervous/anxious and does not have insomnia.   All other systems reviewed and are negative.   Physical Exam: BP 100/62   Pulse 96   Temp 97.9 F (36.6 C)   Ht 6' (1.829 m)   Wt 211 lb 9.6 oz (96 kg)   SpO2 96%   BMI 28.70 kg/m  Wt Readings from Last 3 Encounters:  12/30/21 211 lb 9.6 oz (96 kg)  09/14/21 212 lb 9.6 oz (96.4 kg)  08/02/21 217 lb 9.6 oz (98.7 kg)   General Appearance: Well nourished, in no apparent distress. Eyes: PERRLA, EOMs, conjunctiva no swelling or erythema Sinuses: No Frontal/maxillary tenderness ENT/Mouth: Ext aud canals clear, TMs without erythema, bulging. No erythema, swelling, or exudate on post pharynx.  Tonsils not swollen or erythematous. Hearing normal.  Neck: Supple, thyroid normal.  Respiratory: Respiratory effort normal, BS equal bilaterally without rales, rhonchi, wheezing or stridor.  Cardio: RRR with no MRGs. Brisk peripheral pulses without edema.  Abdomen: Soft, + BS.  Mild epigastric tenderness, no guarding, rebound, hernias, masses. Lymphatics: Non tender without lymphadenopathy.  Musculoskeletal: Full ROM, 5/5 strength, Normal gait Skin: Warm, dry without rashes, lesions, ecchymosis.  Neuro: Cranial nerves intact. No cerebellar symptoms.  Psych: Awake and oriented X 3, normal affect, Insight and Judgment appropriate.    Medicare  Attestation I have personally reviewed: The patient's medical and social history Their use of alcohol, tobacco or illicit drugs Their current medications and supplements The patient's functional ability including ADLs,fall risks, home safety risks, cognitive, and hearing and visual impairment Diet and physical activities Evidence for depression or mood disorders  The patient's weight, height, BMI, and visual acuity have been recorded in the chart.  I have made referrals, counseling, and provided education to the patient based on review of the above and I have provided the patient with a written personalized care plan for preventive services.     Alycia Rossetti, NP 11:42 AM [facility]

## 2021-12-30 ENCOUNTER — Ambulatory Visit (INDEPENDENT_AMBULATORY_CARE_PROVIDER_SITE_OTHER): Payer: PPO | Admitting: Nurse Practitioner

## 2021-12-30 ENCOUNTER — Encounter: Payer: Self-pay | Admitting: Nurse Practitioner

## 2021-12-30 VITALS — BP 100/62 | HR 96 | Temp 97.9°F | Ht 72.0 in | Wt 211.6 lb

## 2021-12-30 DIAGNOSIS — I10 Essential (primary) hypertension: Secondary | ICD-10-CM

## 2021-12-30 DIAGNOSIS — E785 Hyperlipidemia, unspecified: Secondary | ICD-10-CM | POA: Diagnosis not present

## 2021-12-30 DIAGNOSIS — E559 Vitamin D deficiency, unspecified: Secondary | ICD-10-CM | POA: Diagnosis not present

## 2021-12-30 DIAGNOSIS — R6889 Other general symptoms and signs: Secondary | ICD-10-CM

## 2021-12-30 DIAGNOSIS — F419 Anxiety disorder, unspecified: Secondary | ICD-10-CM

## 2021-12-30 DIAGNOSIS — Z79899 Other long term (current) drug therapy: Secondary | ICD-10-CM

## 2021-12-30 DIAGNOSIS — K219 Gastro-esophageal reflux disease without esophagitis: Secondary | ICD-10-CM

## 2021-12-30 DIAGNOSIS — I2510 Atherosclerotic heart disease of native coronary artery without angina pectoris: Secondary | ICD-10-CM

## 2021-12-30 DIAGNOSIS — Z0001 Encounter for general adult medical examination with abnormal findings: Secondary | ICD-10-CM

## 2021-12-30 DIAGNOSIS — N182 Chronic kidney disease, stage 2 (mild): Secondary | ICD-10-CM

## 2021-12-30 DIAGNOSIS — E1122 Type 2 diabetes mellitus with diabetic chronic kidney disease: Secondary | ICD-10-CM | POA: Diagnosis not present

## 2021-12-30 DIAGNOSIS — G4733 Obstructive sleep apnea (adult) (pediatric): Secondary | ICD-10-CM | POA: Diagnosis not present

## 2021-12-30 DIAGNOSIS — E1169 Type 2 diabetes mellitus with other specified complication: Secondary | ICD-10-CM

## 2021-12-30 DIAGNOSIS — E663 Overweight: Secondary | ICD-10-CM | POA: Diagnosis not present

## 2021-12-30 DIAGNOSIS — N521 Erectile dysfunction due to diseases classified elsewhere: Secondary | ICD-10-CM | POA: Diagnosis not present

## 2021-12-30 DIAGNOSIS — I700 Atherosclerosis of aorta: Secondary | ICD-10-CM

## 2021-12-30 DIAGNOSIS — Z0000 Encounter for general adult medical examination without abnormal findings: Secondary | ICD-10-CM

## 2021-12-31 LAB — CBC WITH DIFFERENTIAL/PLATELET
Absolute Monocytes: 424 cells/uL (ref 200–950)
Basophils Absolute: 58 cells/uL (ref 0–200)
Basophils Relative: 1.1 %
Eosinophils Absolute: 170 cells/uL (ref 15–500)
Eosinophils Relative: 3.2 %
HCT: 36.9 % — ABNORMAL LOW (ref 38.5–50.0)
Hemoglobin: 12.7 g/dL — ABNORMAL LOW (ref 13.2–17.1)
Lymphs Abs: 3042 cells/uL (ref 850–3900)
MCH: 31.1 pg (ref 27.0–33.0)
MCHC: 34.4 g/dL (ref 32.0–36.0)
MCV: 90.2 fL (ref 80.0–100.0)
MPV: 10.2 fL (ref 7.5–12.5)
Monocytes Relative: 8 %
Neutro Abs: 1606 cells/uL (ref 1500–7800)
Neutrophils Relative %: 30.3 %
Platelets: 275 10*3/uL (ref 140–400)
RBC: 4.09 10*6/uL — ABNORMAL LOW (ref 4.20–5.80)
RDW: 12.7 % (ref 11.0–15.0)
Total Lymphocyte: 57.4 %
WBC: 5.3 10*3/uL (ref 3.8–10.8)

## 2021-12-31 LAB — COMPLETE METABOLIC PANEL WITH GFR
AG Ratio: 1.5 (calc) (ref 1.0–2.5)
ALT: 16 U/L (ref 9–46)
AST: 23 U/L (ref 10–35)
Albumin: 4.2 g/dL (ref 3.6–5.1)
Alkaline phosphatase (APISO): 55 U/L (ref 35–144)
BUN: 16 mg/dL (ref 7–25)
CO2: 26 mmol/L (ref 20–32)
Calcium: 10 mg/dL (ref 8.6–10.3)
Chloride: 104 mmol/L (ref 98–110)
Creat: 0.76 mg/dL (ref 0.70–1.35)
Globulin: 2.8 g/dL (calc) (ref 1.9–3.7)
Glucose, Bld: 107 mg/dL — ABNORMAL HIGH (ref 65–99)
Potassium: 4 mmol/L (ref 3.5–5.3)
Sodium: 139 mmol/L (ref 135–146)
Total Bilirubin: 0.6 mg/dL (ref 0.2–1.2)
Total Protein: 7 g/dL (ref 6.1–8.1)
eGFR: 97 mL/min/{1.73_m2} (ref >=60)

## 2021-12-31 LAB — LIPID PANEL
Cholesterol: 113 mg/dL (ref <200)
HDL: 42 mg/dL (ref >=40)
LDL Cholesterol (Calc): 58 mg/dL (calc)
Non-HDL Cholesterol (Calc): 71 mg/dL (calc) (ref <130)
Total CHOL/HDL Ratio: 2.7 (calc) (ref <5.0)
Triglycerides: 54 mg/dL (ref <150)

## 2021-12-31 LAB — MICROALBUMIN / CREATININE URINE RATIO
Creatinine, Urine: 138 mg/dL (ref 20–320)
Microalb Creat Ratio: 7 mcg/mg creat (ref <30)
Microalb, Ur: 1 mg/dL

## 2021-12-31 LAB — VITAMIN D 25 HYDROXY (VIT D DEFICIENCY, FRACTURES): Vit D, 25-Hydroxy: 105 ng/mL — ABNORMAL HIGH (ref 30–100)

## 2021-12-31 LAB — HEMOGLOBIN A1C
Hgb A1c MFr Bld: 6.1 % of total Hgb — ABNORMAL HIGH (ref <5.7)
Mean Plasma Glucose: 128 mg/dL
eAG (mmol/L): 7.1 mmol/L

## 2022-03-23 ENCOUNTER — Ambulatory Visit (INDEPENDENT_AMBULATORY_CARE_PROVIDER_SITE_OTHER): Payer: PPO | Admitting: Nurse Practitioner

## 2022-03-23 ENCOUNTER — Encounter: Payer: Self-pay | Admitting: Nurse Practitioner

## 2022-03-23 VITALS — BP 112/64 | HR 82 | Temp 96.6°F | Ht 72.0 in | Wt 214.0 lb

## 2022-03-23 DIAGNOSIS — N182 Chronic kidney disease, stage 2 (mild): Secondary | ICD-10-CM | POA: Diagnosis not present

## 2022-03-23 DIAGNOSIS — E1122 Type 2 diabetes mellitus with diabetic chronic kidney disease: Secondary | ICD-10-CM

## 2022-03-23 DIAGNOSIS — B370 Candidal stomatitis: Secondary | ICD-10-CM | POA: Diagnosis not present

## 2022-03-23 DIAGNOSIS — I10 Essential (primary) hypertension: Secondary | ICD-10-CM | POA: Diagnosis not present

## 2022-03-23 MED ORDER — NYSTATIN 100000 UNIT/ML MT SUSP: OROMUCOSAL | 2 refills | Status: DC

## 2022-03-23 NOTE — Patient Instructions (Addendum)
Nystatin swish and spit 4 times a day  Mucinex as needed to decongest head.   Oral Thrush, Adult Oral thrush, also called oral candidiasis, is a fungal infection that develops in the mouth and throat and on the tongue. It causes white patches to form in the mouth and on the tongue. Many cases of thrush are mild, but this infection can also be serious. Wayne [name] can be a repeated (recurrent) problem for certain people who have a weak body defense system (immune system). The weakness can be caused by chronic illnesses, or by taking medicines that limit the body's ability to fight infection. If a person has difficulty fighting infection, the fungus that causes thrush can spread through the body. This can cause life-threatening blood or organ infections. What are the causes? This condition is caused by a fungus (yeast) called Candida albicans. This fungus is normally present in small amounts in the mouth and on other mucous membranes. It usually causes no harm. If conditions are present that allow the fungus to grow without control, it invades surrounding tissues and becomes an infection. Other Candida species can also lead to thrush, though this is rare. What increases the risk? The following factors may make you more likely to develop this condition: Having a weakened immune system. Being an older adult. Having diabetes, cancer, or HIV (human immunodeficiency virus). Having dry mouth (xerostomia). Being pregnant or breastfeeding. Having poor dental care, especially in those who have dentures. Using antibiotic or steroid medicines. What are the signs or symptoms? Symptoms of this condition can vary from mild and moderate to severe and persistent. Symptoms may include: A burning feeling in the mouth and throat. This can occur at the start of a thrush infection. White patches that stick to the mouth and tongue. The tissue around the patches may be red, raw, and painful. If rubbed (during tooth  brushing, for example), the patches and the tissue of the mouth may bleed easily. A bad taste in the mouth or difficulty tasting foods. A cottony feeling in the mouth. Pain during eating and swallowing. Poor appetite. Cracking at the corners of the mouth. How is this diagnosed? This condition is diagnosed based on: A physical exam. Your medical history. How is this treated? This condition is treated with medicines called antifungals, which prevent the growth of fungi. These medicines are either applied directly to the affected area (topical) or swallowed (oral). The treatment will depend on the severity of the condition. Mild cases of thrush may be treated with an antifungal mouth rinse or lozenges. Treatment usually lasts about 14 days. Moderate to severe cases of thrush can be treated with oral antifungal medicine, if they have spread to the esophagus. A topical antifungal medicine may also be used. For some severe infections, treatment may need to continue for more than 14 days. Oral antifungal medicines are rarely used during pregnancy because they may be harmful to the unborn child. If you are pregnant, talk with your health care provider about options for treatment. Persistent or recurrent thrush. For cases of thrush that do not go away or keep coming back: Treatment may be needed twice as long as the symptoms last. Treatment will include both oral and topical antifungal medicines. People with a weakened immune system can take an antifungal medicine on a continuous basis to prevent thrush infections. It is important to treat conditions that make a person more likely to get thrush, such as diabetes or HIV. Follow these instructions at home: Relieving soreness and  discomfort To help reduce the discomfort of thrush: Drink cold liquids such as water or iced tea. Try flavored ice treats or frozen juices. Eat foods that are easy to swallow, such as gelatin, ice cream, or custard. Try  drinking from a straw if the patches in your mouth are painful.  General instructions Take or use over-the-counter and prescription medicines only as told by your health care provider. Eat plain, unflavored yogurt as directed by your health care provider. Check the label to make sure the yogurt contains live cultures. This yogurt can help healthy bacteria grow in the mouth and can stop the growth of the fungus that causes thrush. If you wear dentures, remove the dentures before going to bed, brush them vigorously, and soak them in a cleaning solution as directed by your health care provider. Rinse your mouth with a warm salt-water mixture several times a day. To make a salt-water mixture, dissolve -1 tsp (3-6 g) of salt in 1 cup (237 mL) of warm water. Contact a health care provider if: Your symptoms are getting worse or are not improving within 7 days of starting treatment. You have symptoms of a spreading infection, such as white patches on the skin outside of the mouth. You are breastfeeding your baby and you have redness and pain in the nipples. Summary Oral thrush, also called oral candidiasis, is a fungal infection that develops in the mouth and throat and on the tongue. It causes white patches to form in the mouth and on the tongue. You are more likely to get this condition if you have a weakened immune system or an underlying condition, such as HIV, cancer, or diabetes. This condition is treated with medicines called antifungals, which prevent the growth of fungi. Contact a health care provider if your symptoms do not improve, or get worse, within 7 days of starting treatment. This information is not intended to replace advice given to you by your health care provider. Make sure you discuss any questions you have with your health care provider. Document Revised: 12/29/2020 Document Reviewed: 03/07/2019 Elsevier Patient Education  2023 [employer].

## 2022-03-23 NOTE — Progress Notes (Signed)
Assessment and Plan:  Wayne [name] was seen today for acute visit.  Diagnoses and all orders for this visit:  Essential hypertension - continue medications, DASH diet, exercise and monitor at home. Call if greater than 130/80.   Type 2 diabetes mellitus with stage 2 chronic kidney disease, without long-term current use of insulin (HCC) Continue medication: metformin, trulicity Continue diet and exercise.  Perform daily foot/skin check, notify office of any concerning changes.   Oral thrush Nystatin swish and spit 4 times a day until symptoms are resolved -     nystatin (MYCOSTATIN) 100000 UNIT/ML suspension; 5 ml four times a day, retain in mouth as long as possible (Swish and Spit).  Use for 48 hours after symptoms resolve.       Further disposition pending results of labs. Discussed med's effects and SE's.   Over 30 minutes of exam, counseling, chart review, and critical decision making was performed.   Future Appointments  Date Time Provider [place]  04/03/2022  4:00 PM Unk Pinto, MD [facility] None  07/04/2022  3:00 PM Unk Pinto, MD [facility] None  10/03/2022  4:00 PM Alycia Rossetti, NP [facility] None    ------------------------------------------------------------------------------------------------------------------   HPI BP 112/64   Pulse 82   Temp (!) 96.6 F (35.9 C)   Ht 6' (1.829 m)   Wt 214 lb (97.1 kg)   SpO2 95%   BMI 29.02 kg/m    71 y.o.male presents for bumps on his tongue that are very painful.  Does have a sore throat as well.  This occurred previously with a dental procedure, an extraction. Then 4 days ago they did prep work for a bridge which ws drilling for 2 hours.     BP is currently well controlled with Hyzaar 100/25 mg daily.  Denies headaches, chest pain, shortness of breath and dizziness BP Readings from Last 3 Encounters:  03/23/22 112/64  12/30/21 100/62  09/14/21 110/70   BMI is Body mass index is 29.02 kg/m., he  has not been working on diet and exercise. Wt Readings from Last 3 Encounters:  03/23/22 214 lb (97.1 kg)  12/30/21 211 lb 9.6 oz (96 kg)  09/14/21 212 lb 9.6 oz (96.4 kg)    He is currently on Trulicity 4.5 mg SQ QW  and Metformin 500 mg 2 tabs bid for Type 2 Diabetes Mellitus. He is on ARB for BP, Rosuvastatin 20 mg for hyperlipidemia and dose take a low dose ASA daily Lab Results  Component Value Date   HGBA1C 6.1 (H) 12/30/2021    Lab Results  Component Value Date   EGFR 97 12/30/2021    Past Medical History:  Diagnosis Date   Allergy    Anxiety    On Xanax   Chest pain    Fatigue    GERD (gastroesophageal reflux disease)    Heart murmur    hx of MVP    HTN (hypertension)    Hyperlipemia    IBS (irritable bowel syndrome)    Medication management 06/13/2013   Mitral valve prolapse    Obesity (BMI 30.0-34.9) 08/20/2015   OSA (obstructive sleep apnea)    Restated CPAP   Sleep apnea    no cpap    T2_NIDDM    Testosterone deficiency 08/20/2015   Type II or unspecified type diabetes mellitus without mention of complication, not stated as uncontrolled    Vitamin D deficiency      No Known Allergies  Current Outpatient Medications on File Prior to Visit  Medication Sig   Acetaminophen (TYLENOL PO) Take by mouth.   aspirin 81 MG tablet Take 81 mg by mouth daily.   atenolol (TENORMIN) 100 MG tablet TAKE ONE TABLET BY MOUTH DAILY FOR BLOOD PRESSURE   citalopram (CELEXA) 40 MG tablet Take  1 tablet  Daily for Mood                                                                 /                                TAKE BY MOUTH DAILY   Continuous Blood Gluc Sensor (FREESTYLE LIBRE 14 DAY SENSOR) MISC USE TO CHECK BLOOD SUGARS THREE TIMES A DAY AS DIRECTED   Dulaglutide (TRULICITY) 4.5 JQ/7.3AL SOPN Inject 4.5 mg into the Skin Once  Weekly   for Diabetes  ( Dx:   e11.29 )   glucose blood (ONETOUCH VERIO) test strip Check blood sugar 1 time a day-DX-E11.22.   ibuprofen (ADVIL) 800  MG tablet Take 800 mg by mouth every 8 (eight) hours as needed.   losartan-hydrochlorothiazide (HYZAAR) 100-25 MG tablet TAKE ONE TABLET BY MOUTH ONE TIME DAILY   Magnesium 400 MG CAPS Take 1 capsuile Daily (Patient taking differently: 500 mg. Takes 2 capsules Daily)   metFORMIN (GLUCOPHAGE-XR) 500 MG 24 hr tablet Take  2 tablets  2 x /day with  Meals for Diabetes                                    /                                    TAKE                              BY                      MOUTH   raNITIdine HCl (ZANTAC PO) Take by mouth.   rosuvastatin (CRESTOR) 20 MG tablet Take  1 tablet  Daily  for Cholesterol                                             /                     TAKE 1 TABLET BY MOUTH   tadalafil (CIALIS) 20 MG tablet Take 1/2 to 1 tablet every 2 to 3 days as needed for XXXX tadalafil (CIALIS) 20 MG tablet 30 tablet 2 11/20/2021 Sent to pharmacy as: Tadalafil 20 MG Oral Tablet (Cialis) E-Prescribing Status: Receipt confirmed by pharmacy ( [facility])  (11/20/2021 12:04 AM EDT)   VITAMIN D PO Take 10,000 Units by mouth daily.    zinc gluconate 50 MG tablet Take 50 mg by mouth daily.   famotidine (PEPCID) 20 MG tablet Take 1 tablet (  20 mg total) by mouth 2 (two) times daily. (Patient not taking: Reported on 12/30/2021)   loratadine (CLARITIN) 10 MG tablet Take 10 mg by mouth daily. (Patient not taking: Reported on 12/30/2021)   montelukast (SINGULAIR) 10 MG tablet Take 1 tablet Daily for Allergies (Patient not taking: Reported on 12/30/2021)   No current facility-administered medications on file prior to visit.    ROS: all negative except above.   Physical Exam:  BP 112/64   Pulse 82   Temp (!) 96.6 F (35.9 C)   Ht 6' (1.829 m)   Wt 214 lb (97.1 kg)   SpO2 95%   BMI 29.02 kg/m   General Appearance: Well nourished, in no apparent distress. Eyes: PERRLA, EOMs, conjunctiva no swelling or erythema Sinuses: No Frontal/maxillary tenderness ENT/Mouth: Ext aud canals clear, TMs  without erythema, bulging. White thick patches on tongue Neck: Supple, thyroid normal.  Respiratory: Respiratory effort normal, BS equal bilaterally without rales, rhonchi, wheezing or stridor.  Cardio: RRR with no MRGs. Brisk peripheral pulses without edema.  Abdomen: Soft, + BS.  Non tender, no guarding, rebound, hernias, masses. Lymphatics: Non tender without lymphadenopathy.  Musculoskeletal: Full ROM, 5/5 strength, normal gait.  Skin: Warm, dry without rashes, lesions, ecchymosis.  Neuro: Cranial nerves intact. Normal muscle tone, no cerebellar symptoms. Sensation intact.  Psych: Awake and oriented X 3, normal affect, Insight and Judgment appropriate.     Alycia Rossetti, NP 4:47 PM [facility]

## 2022-04-02 NOTE — Progress Notes (Signed)
Future Appointments  Date Time Provider Department  04/03/2022  4:00 PM Lucky Cowboy, MD [facility]  07/04/2022                  cpe  3:00 PM Lucky Cowboy, MD [facility]  10/03/2022                  wellness  4:00 PM Raynelle Dick, NP [facility]    History of Present Illness:       This very nice 71 y.o.  MBM presents for  9 month follow up with HTN, HLD, Pre-Diabetes and Vitamin D Deficiency.  Chest CT scan on 11/05/2018 showed Aortic Atherosclerosis.        Patient is treated for HTN  (1989) & BP has been controlled at home. Today's BP is at goal -   124/78. Patient has had no complaints of any cardiac type chest pain, palpitations, dyspnea Pollyann Kennedy /PND, dizziness, claudication or dependent edema.       Hyperlipidemia is controlled with diet & Rosuvastatin. Patient denies myalgias or other med SE's. Last Lipids were at goal :  Lab Results  Component Value Date   CHOL 113 12/30/2021   HDL 42 12/30/2021   LDLCALC 58 12/30/2021   TRIG 54 12/30/2021   CHOLHDL 2.7 12/30/2021     Also, the patient has Moderate Obesity  (BMI 29.0)  and history of T2_NIDDM (2007)  w/CKD1  and has had no symptoms of reactive hypoglycemia, diabetic polys, paresthesias or visual blurring.  Last A1c was not at goal :  Lab Results  Component Value Date   HGBA1C 6.1 (H) 12/30/2021   Wt Readings from Last 3 Encounters:  04/03/22 213 lb 12.8 oz (97 kg)  03/23/22 214 lb (97.1 kg)  12/30/21 211 lb 9.6 oz (96 kg)                                                        Further, the patient also has history of Vitamin D Deficiency ("22" /2008 & "34" /2019) and supplements vitamin D without any suspected side-effects. Last vitamin D was at goal :  Lab Results  Component Value Date   VD25OH 94 01/26/2021     Current Outpatient Medications  Medication Instructions   Acetaminophen  Oral   Aspirin  81 mg Daily   atenolol   100 MG tablet TAKE ONE TABLET  DAILY    citalopram    40 MG  tablet Take  1 tablet  Daily    Dulaglutide (TRULICITY) 4.5 MG/0.5ML  Inject 4.5 mg into the Skin Once  Weekly   for Diabetes  ( Dx:   e11.29)   famotidine    20 mg, Oral, 2 times daily   ibuprofen    800 mg, Oral, Every 8 hours PRN   loratadine    10 mg, Daily   losartan-hctz 100-25 MG tablet TAKE ONE TABLET DAILY   Magnesium 400 MG CAPS Take 1 capsuile Daily   metFORMIN-XR 500 MG  Take  2 tablets  2 x /day with  Meals    montelukast 10 MG tablet Take 1 tablet Daily for Allergies   nystatin  100,000 UNIT/ML susp 5 ml four times a day (Swish and Spit)   rosuvastatin 20 MG tablet Take  1 tablet  Daily  tadalafil  20 MG tablet Take 1/2 to 1 tablet every 2 to 3 days as needed for XXXX   VITAMIN D   10,000 Units Daily   zinc gluconate  50 mg , Oral, Daily     No Known Allergies    PMHx:   Past Medical History:  Diagnosis Date   Allergy    Anxiety    On Xanax   Chest pain    Fatigue    GERD (gastroesophageal reflux disease)    Heart murmur    hx of MVP    HTN (hypertension)    Hyperlipemia    IBS (irritable bowel syndrome)    Medication management 06/13/2013   Mitral valve prolapse    Obesity (BMI 30.0-34.9) 08/20/2015   OSA (obstructive sleep apnea)    Restated CPAP   Sleep apnea    no cpap    T2_NIDDM    Testosterone deficiency 08/20/2015   Type II diabetes mellitus     Vitamin D deficiency      Immunization History  Administered Date(s) Administered   Influenza Inj Mdck Quad  01/29/2017   Influenza Split 03/01/2015   Influenza, High Dose   04/23/2019, 03/08/2020, 03/15/2021   Influenza,inj,quad 03/03/2016   Moderna Sars-Covid-2 Vacc 06/15/2019, 07/13/2019, 05/03/2020   PPD Test  01/29/2017, 03/19/2018, 07/28/2020   Pneumococcal -13 03/19/2018   Pneumococcal -23 05/21/2008, 04/23/2019   Td 10/30/2003   Tdap 01/30/2014     Past Surgical History:  Procedure Laterality Date   COLONOSCOPY     TONSILLECTOMY     WISDOM TOOTH EXTRACTION       FHx:     Reviewed / unchanged  SHx:    Reviewed / unchanged   Systems Review:  Constitutional: Denies fever, chills, wt changes, headaches, insomnia, fatigue, night sweats, change in appetite. Eyes: Denies redness, blurred vision, diplopia, discharge, itchy, watery eyes.  ENT: Denies discharge, congestion, post nasal drip, epistaxis, sore throat, earache, hearing loss, dental pain, tinnitus, vertigo, sinus pain, snoring.  CV: Denies chest pain, palpitations, irregular heartbeat, syncope, dyspnea, diaphoresis, orthopnea, PND, claudication or edema. Respiratory: denies cough, dyspnea, DOE, pleurisy, hoarseness, laryngitis, wheezing.  Gastrointestinal: Denies dysphagia, odynophagia, heartburn, reflux, water brash, abdominal pain or cramps, nausea, vomiting, bloating, diarrhea, constipation, hematemesis, melena, hematochezia  or hemorrhoids. Genitourinary: Denies dysuria, frequency, urgency, nocturia, hesitancy, discharge, hematuria or flank pain. Musculoskeletal: Denies arthralgias, myalgias, stiffness, jt. swelling, pain, limping or strain/sprain.  Skin: Denies pruritus, rash, hives, warts, acne, eczema or change in skin lesion(s). Neuro: No weakness, tremor, incoordination, spasms, paresthesia or pain. Psychiatric: Denies confusion, memory loss or sensory loss. Endo: Denies change in weight, skin or hair change.  Heme/Lymph: No excessive bleeding, bruising or enlarged lymph nodes.  Physical Exam  BP 124/78   Pulse 84   Temp 97.9 F (36.6 C)   Resp 16   Ht 6' (1.829 m)   Wt 213 lb 12.8 oz (97 kg)   SpO2 95%   BMI 29.00 kg/m   Appears  well nourished, well groomed  and in no distress.  Eyes: PERRLA, EOMs, conjunctiva no swelling or erythema. Sinuses: No frontal/maxillary tenderness ENT/Mouth: EAC's clear, TM's nl w/o erythema, bulging. Nares clear w/o erythema, swelling, exudates. Oropharynx clear without erythema or exudates. Oral hygiene is good. Tongue normal, non obstructing. Hearing  intact.  Neck: Supple. Thyroid not palpable. Car 2+/2+ without bruits, nodes or JVD. Chest: Respirations nl with BS clear & equal w/o rales, rhonchi, wheezing or stridor.  Cor: Heart sounds normal w/  regular rate and rhythm without sig. murmurs, gallops, clicks or rubs. Peripheral pulses normal and equal  without edema.  Abdomen: Soft & bowel sounds normal. Non-tender w/o guarding, rebound, hernias, masses or organomegaly.  Lymphatics: Unremarkable.  Musculoskeletal: Full ROM all peripheral extremities, joint stability, 5/5 strength and normal gait.  Skin: Warm, dry without exposed rashes, lesions or ecchymosis apparent.  Neuro: Cranial nerves intact, reflexes equal bilaterally. Sensory-motor testing grossly intact. Tendon reflexes grossly intact.  Pysch: Alert & oriented x 3.  Insight and judgement nl & appropriate. No ideations.  Assessment and Plan:  1. Essential hypertension  - CBC with Differential/Platelet - COMPLETE METABOLIC PANEL WITH GFR - Magnesium - TSH  2. Type 2 diabetes mellitus with stage 2 chronic kidney  disease, unspecified whether long term insulin use (HCC)  - Lipid panel - TSH  3. Hyperlipidemia associated with type 2 diabetes mellitus (HCC)  - Hemoglobin A1c - Insulin, random  4. Vitamin D deficiency  - VITAMIN D 25 Hydroxy   5. Aortic atherosclerosis (HCC) by Chest CT scan 06.23.2020  - Lipid panel  6. Coronary artery disease involving native coronary  artery of native heart without angina pectoris  - Lipid panel  7. Medication management  - CBC with Differential/Platelet - COMPLETE METABOLIC PANEL WITH GFR - Magnesium - Lipid panel - TSH - Hemoglobin A1c - Insulin, random - VITAMIN D 25 Hydroxy          Discussed  regular exercise, BP monitoring, weight control to achieve/maintain BMI less than 25 and discussed med and SE's. Recommended labs to assess and monitor clinical status with further disposition pending results of labs.  I  discussed the assessment and treatment plan with the patient. The patient was provided an opportunity to ask questions and all were answered. The patient agreed with the plan and demonstrated an understanding of the instructions.  I provided over 30 minutes of exam, counseling, chart review and  complex critical decision making.        The patient was advised to call back or seek an in-person evaluation if the symptoms worsen or if the condition fails to improve as anticipated.   Marinus Maw, MD

## 2022-04-02 NOTE — Progress Notes (Incomplete)
Future Appointments  Date Time Provider Department  04/03/2022  4:00 PM Lucky Cowboy, MD [facility]  07/04/2022                  cpe  3:00 PM Lucky Cowboy, MD [facility]  10/03/2022                  wellness  4:00 PM Raynelle Dick, NP [facility]    History of Present Illness:       This very nice 71 y.o.  MBM presents for  9 month follow up with HTN, HLD, Pre-Diabetes and Vitamin D Deficiency.  Chest CT scan on 11/05/2018 showed Aortic Atherosclerosis.        Patient is treated for HTN  (1989) & BP has been controlled at home. Today's BP is at goal -                                . Patient has had no complaints of any cardiac type chest pain, palpitations, dyspnea Pollyann Kennedy /PND, dizziness, claudication or dependent edema.       Hyperlipidemia is controlled with diet & Rosuvastatin. Patient denies myalgias or other med SE's. Last Lipids were at goal :  Lab Results  Component Value Date   CHOL 113 12/30/2021   HDL 42 12/30/2021   LDLCALC 58 12/30/2021   TRIG 54 12/30/2021   CHOLHDL 2.7 12/30/2021     Also, the patient has Moderate Obesity  (BMI 31.6)  and history of T2_NIDDM (2007)  w/CKD1  and has had no symptoms of reactive hypoglycemia, diabetic polys, paresthesias or visual blurring.  Last A1c was not at goal :  Lab Results  Component Value Date   HGBA1C 6.1 (H) 12/30/2021                                                        Further, the patient also has history of Vitamin D Deficiency ("22" /2008 & "34" /2019) and supplements vitamin D without any suspected side-effects. Last vitamin D was at goal :  Lab Results  Component Value Date   VD25OH 94 01/26/2021     Current Outpatient Medications on File Prior to Visit  Medication Sig  . aspirin 81 MG tablet Take  daily.  Marland Kitchen atenolol 100 MG tablet Take  1 tablet  Daily  for BP  . citalopram 40 MG tablet TAKE ONE TABLET  DAILY   . losartan-hctz 100-25 MG tablet Take  1 tablet  Daily    . Magnesium  400 MG CAPS Takes 2 capsules Daily  . metFORMIN -XR 500 MG 24 hr tablet Take  2 tablets  2 x /day  with Meals  for Diabetes  . rosuvastatin  20 MG tablet TAKE ONE TABLET  DAILY   . VITAMIN D PO Take 10,000 Units daily.   Marland Kitchen zinc 50 MG tablet Take  daily.    No Known Allergies   PMHx:   Past Medical History:  Diagnosis Date  . Allergy   . Anxiety    On Xanax  . Chest pain   . Fatigue   . GERD (gastroesophageal reflux disease)   . Heart murmur    hx of MVP   . HTN (hypertension)   .  Hyperlipemia   . IBS (irritable bowel syndrome)   . Medication management 06/13/2013  . Mitral valve prolapse   . Obesity (BMI 30.0-34.9) 08/20/2015  . OSA (obstructive sleep apnea)    Restated CPAP  . Sleep apnea    no cpap   . T2_NIDDM   . Testosterone deficiency 08/20/2015  . Type II or unspecified type diabetes mellitus without mention of complication, not stated as uncontrolled   . Vitamin D deficiency      Immunization History  Administered Date(s) Administered  . Influenza Inj Mdck Quad  01/29/2017  . Influenza Split 03/01/2015  . Influenza, High Dose   04/23/2019, 03/08/2020, 03/15/2021  . Influenza,inj,quad 03/03/2016  . Moderna Sars-Covid-2 Vacc 06/15/2019, 07/13/2019, 05/03/2020  . PPD Test  01/29/2017, 03/19/2018, 07/28/2020  . Pneumococcal -13 03/19/2018  . Pneumococcal -23 05/21/2008, 04/23/2019  . Td 10/30/2003  . Tdap 01/30/2014     Past Surgical History:  Procedure Laterality Date  . COLONOSCOPY    . TONSILLECTOMY    . WISDOM TOOTH EXTRACTION      FHx:    Reviewed / unchanged  SHx:    Reviewed / unchanged   Systems Review:  Constitutional: Denies fever, chills, wt changes, headaches, insomnia, fatigue, night sweats, change in appetite. Eyes: Denies redness, blurred vision, diplopia, discharge, itchy, watery eyes.  ENT: Denies discharge, congestion, post nasal drip, epistaxis, sore throat, earache, hearing loss, dental pain, tinnitus, vertigo, sinus pain,  snoring.  CV: Denies chest pain, palpitations, irregular heartbeat, syncope, dyspnea, diaphoresis, orthopnea, PND, claudication or edema. Respiratory: denies cough, dyspnea, DOE, pleurisy, hoarseness, laryngitis, wheezing.  Gastrointestinal: Denies dysphagia, odynophagia, heartburn, reflux, water brash, abdominal pain or cramps, nausea, vomiting, bloating, diarrhea, constipation, hematemesis, melena, hematochezia  or hemorrhoids. Genitourinary: Denies dysuria, frequency, urgency, nocturia, hesitancy, discharge, hematuria or flank pain. Musculoskeletal: Denies arthralgias, myalgias, stiffness, jt. swelling, pain, limping or strain/sprain.  Skin: Denies pruritus, rash, hives, warts, acne, eczema or change in skin lesion(s). Neuro: No weakness, tremor, incoordination, spasms, paresthesia or pain. Psychiatric: Denies confusion, memory loss or sensory loss. Endo: Denies change in weight, skin or hair change.  Heme/Lymph: No excessive bleeding, bruising or enlarged lymph nodes.  Physical Exam  There were no vitals taken for this visit.  Appears  well nourished, well groomed  and in no distress.  Eyes: PERRLA, EOMs, conjunctiva no swelling or erythema. Sinuses: No frontal/maxillary tenderness ENT/Mouth: EAC's clear, TM's nl w/o erythema, bulging. Nares clear w/o erythema, swelling, exudates. Oropharynx clear without erythema or exudates. Oral hygiene is good. Tongue normal, non obstructing. Hearing intact.  Neck: Supple. Thyroid not palpable. Car 2+/2+ without bruits, nodes or JVD. Chest: Respirations nl with BS clear & equal w/o rales, rhonchi, wheezing or stridor.  Cor: Heart sounds normal w/ regular rate and rhythm without sig. murmurs, gallops, clicks or rubs. Peripheral pulses normal and equal  without edema.  Abdomen: Soft & bowel sounds normal. Non-tender w/o guarding, rebound, hernias, masses or organomegaly.  Lymphatics: Unremarkable.  Musculoskeletal: Full ROM all peripheral extremities,  joint stability, 5/5 strength and normal gait.  Skin: Warm, dry without exposed rashes, lesions or ecchymosis apparent.  Neuro: Cranial nerves intact, reflexes equal bilaterally. Sensory-motor testing grossly intact. Tendon reflexes grossly intact.  Pysch: Alert & oriented x 3.  Insight and judgement nl & appropriate. No ideations.  Assessment and Plan:  1. Essential hypertension  - Continue diet/meds, exercise,& lifestyle modifications.  - Continue monitor periodic cholesterol/liver & renal functions   2. Hyperlipidemia associated with type 2 diabetes mellitus (  HCC)  - Continue medication, monitor blood pressure at home.  - Continue DASH diet.  Reminder to go to the ER if any CP,  SOB, nausea, dizziness, severe HA, changes vision/speech.     3. Type 2 diabetes mellitus with stage 2 chronic kidney  disease, without long-term current use of insulin (HCC)  - Long discussion with patient re: elevated A1c.   Encouraged better diet and recc Trulicity  for combined better control & weight loss.                                                                                                                                                                                                                                          - Continue supplementation.    5. Aortic atherosclerosis (HCC) by Chest CT scan 06.23.2020   6. Need for immunization against influenza  - Flu vaccine HIGH DOSE PF (Fluzone High dose)  7. Medication management         Discussed  regular exercise, BP monitoring, weight control to achieve/maintain BMI less than 25 and discussed med and SE's. Recommended labs to assess and monitor clinical status with further disposition pending results of labs.  I discussed the assessment and treatment plan with the patient. The patient was provided an opportunity to ask questions and all were answered. The patient agreed with the plan and demonstrated an understanding of the  instructions.  I provided over 30 minutes of exam, counseling, chart review and  complex critical decision making.        The patient was advised to call back or seek an in-person evaluation if the symptoms worsen or if the condition fails to improve as anticipated.   Marinus Maw, MD

## 2022-04-03 ENCOUNTER — Encounter: Payer: Self-pay | Admitting: Internal Medicine

## 2022-04-03 ENCOUNTER — Ambulatory Visit (INDEPENDENT_AMBULATORY_CARE_PROVIDER_SITE_OTHER): Payer: PPO | Admitting: Internal Medicine

## 2022-04-03 VITALS — BP 124/78 | HR 84 | Temp 97.9°F | Resp 16 | Ht 72.0 in | Wt 213.8 lb

## 2022-04-03 DIAGNOSIS — E1122 Type 2 diabetes mellitus with diabetic chronic kidney disease: Secondary | ICD-10-CM | POA: Diagnosis not present

## 2022-04-03 DIAGNOSIS — N182 Chronic kidney disease, stage 2 (mild): Secondary | ICD-10-CM | POA: Diagnosis not present

## 2022-04-03 DIAGNOSIS — E785 Hyperlipidemia, unspecified: Secondary | ICD-10-CM

## 2022-04-03 DIAGNOSIS — I2510 Atherosclerotic heart disease of native coronary artery without angina pectoris: Secondary | ICD-10-CM

## 2022-04-03 DIAGNOSIS — I10 Essential (primary) hypertension: Secondary | ICD-10-CM | POA: Diagnosis not present

## 2022-04-03 DIAGNOSIS — Z79899 Other long term (current) drug therapy: Secondary | ICD-10-CM | POA: Diagnosis not present

## 2022-04-03 DIAGNOSIS — I700 Atherosclerosis of aorta: Secondary | ICD-10-CM | POA: Diagnosis not present

## 2022-04-03 DIAGNOSIS — E559 Vitamin D deficiency, unspecified: Secondary | ICD-10-CM | POA: Diagnosis not present

## 2022-04-03 DIAGNOSIS — E1169 Type 2 diabetes mellitus with other specified complication: Secondary | ICD-10-CM

## 2022-04-03 NOTE — Patient Instructions (Signed)

## 2022-04-04 ENCOUNTER — Other Ambulatory Visit: Payer: Self-pay | Admitting: Internal Medicine

## 2022-04-04 LAB — COMPLETE METABOLIC PANEL WITH GFR
AG Ratio: 1.3 (calc) (ref 1.0–2.5)
ALT: 11 U/L (ref 9–46)
AST: 17 U/L (ref 10–35)
Albumin: 4.2 g/dL (ref 3.6–5.1)
Alkaline phosphatase (APISO): 66 U/L (ref 35–144)
BUN: 17 mg/dL (ref 7–25)
CO2: 31 mmol/L (ref 20–32)
Calcium: 10 mg/dL (ref 8.6–10.3)
Chloride: 101 mmol/L (ref 98–110)
Creat: 0.96 mg/dL (ref 0.70–1.35)
Globulin: 3.3 g/dL (calc) (ref 1.9–3.7)
Glucose, Bld: 73 mg/dL (ref 65–99)
Potassium: 4.4 mmol/L (ref 3.5–5.3)
Sodium: 139 mmol/L (ref 135–146)
Total Bilirubin: 0.4 mg/dL (ref 0.2–1.2)
Total Protein: 7.5 g/dL (ref 6.1–8.1)
eGFR: 86 mL/min/{1.73_m2} (ref >=60)

## 2022-04-04 LAB — CBC WITH DIFFERENTIAL/PLATELET
Absolute Monocytes: 490 cells/uL (ref 200–950)
Basophils Absolute: 61 cells/uL (ref 0–200)
Basophils Relative: 0.9 %
Eosinophils Absolute: 211 cells/uL (ref 15–500)
Eosinophils Relative: 3.1 %
HCT: 38.5 % (ref 38.5–50.0)
Hemoglobin: 13.1 g/dL — ABNORMAL LOW (ref 13.2–17.1)
Lymphs Abs: 3883 cells/uL (ref 850–3900)
MCH: 31.1 pg (ref 27.0–33.0)
MCHC: 34 g/dL (ref 32.0–36.0)
MCV: 91.4 fL (ref 80.0–100.0)
MPV: 10.8 fL (ref 7.5–12.5)
Monocytes Relative: 7.2 %
Neutro Abs: 2156 cells/uL (ref 1500–7800)
Neutrophils Relative %: 31.7 %
Platelets: 277 10*3/uL (ref 140–400)
RBC: 4.21 10*6/uL (ref 4.20–5.80)
RDW: 12.2 % (ref 11.0–15.0)
Total Lymphocyte: 57.1 %
WBC: 6.8 10*3/uL (ref 3.8–10.8)

## 2022-04-04 LAB — LIPID PANEL
Cholesterol: 156 mg/dL (ref <200)
HDL: 46 mg/dL (ref >=40)
LDL Cholesterol (Calc): 92 mg/dL (calc)
Non-HDL Cholesterol (Calc): 110 mg/dL (calc) (ref <130)
Total CHOL/HDL Ratio: 3.4 (calc) (ref <5.0)
Triglycerides: 89 mg/dL (ref <150)

## 2022-04-04 LAB — TSH: TSH: 2.97 mIU/L (ref 0.40–4.50)

## 2022-04-04 LAB — VITAMIN D 25 HYDROXY (VIT D DEFICIENCY, FRACTURES): Vit D, 25-Hydroxy: 82 ng/mL (ref 30–100)

## 2022-04-04 LAB — INSULIN, RANDOM: Insulin: 9.5 u[IU]/mL

## 2022-04-04 LAB — HEMOGLOBIN A1C
Hgb A1c MFr Bld: 6.4 % of total Hgb — ABNORMAL HIGH (ref <5.7)
Mean Plasma Glucose: 137 mg/dL
eAG (mmol/L): 7.6 mmol/L

## 2022-04-04 LAB — MAGNESIUM: Magnesium: 1.9 mg/dL (ref 1.5–2.5)

## 2022-04-04 NOTE — Progress Notes (Signed)
<><><><><><><><><><><><><><><><><><><><><><><><><><><><><><><><><> <><><><><><><><><><><><><><><><><><><><><><><><><><><><><><><><><> - Test results slightly outside the reference range are not unusual. If there is anything important, I will review this with you,  otherwise it is considered normal test values.  If you have further questions,  please do not hesitate to contact me at the office or via My Chart.  <><><><><><><><><><><><><><><><><><><><><><><><><><><><><><><><><> <><><><><><><><><><><><><><><><><><><><><><><><><><><><><><><><><>  -  A1c = 6.4%  - Still too High - Ideal or Goal is less than 5.7%                                                                               - Keep working on Diet& Weight loss. <><><><><><><><><><><><><><><><><><><><><><><><><><><><><><><><><> <><><><><><><><><><><><><><><><><><><><><><><><><><><><><><><><><>  -     -  Total  Chol =   156  - Excellent             (  Ideal  or  Goal is less than 180  !  )  & -  Bad / Dangerous LDL  Chol = 92    - is slightly elevated Elevated              (  Ideal  or  Goal is less than 70  !  )   - Recommend a stricter  low cholesterol diet   - Cholesterol only comes from animal sources                                                                                         - ie. meat, dairy, egg yolks  - Eat all the vegetables you want.  - Avoid Meat, Avoid Meat,  Avoid Meat                                                                     - especially Red Meat - Beef AND Pork .  - Avoid cheese & dairy - milk & ice cream.     - Cheese is the most concentrated form of trans-fats which                                                                    is the worst thing to clog up our arteries.   - Veggie cheese is OK which can be found in the fresh produce section at  [employer] or [employer] or  [employer] <><><><><><><><><><><><><><><><><><><><><><><><><><><><><><><><><> <><><><><><><><><><><><><><><><><><><><><><><><><><><><><><><><><>  -  Vitamin D = 82 - Excellent  -   Please keep dose same  <><><><><><><><><><><><><><><><><><><><><><><><><><><><><><><><><>  -  All Else - CBC - Kidneys - Electrolytes - Liver - Magnesium & Thyroid    - all  Normal / OK <><><><><><><><><><><><><><><><><><><><><><><><><><><><><><><><><> <><><><><><><><><><><><><><><><><><><><><><><><><><><><><><><><><>

## 2022-04-22 ENCOUNTER — Other Ambulatory Visit: Payer: Self-pay | Admitting: Internal Medicine

## 2022-04-22 DIAGNOSIS — E782 Mixed hyperlipidemia: Secondary | ICD-10-CM

## 2022-05-10 ENCOUNTER — Other Ambulatory Visit: Payer: Self-pay

## 2022-05-10 ENCOUNTER — Ambulatory Visit (INDEPENDENT_AMBULATORY_CARE_PROVIDER_SITE_OTHER): Payer: PPO | Admitting: Nurse Practitioner

## 2022-05-10 VITALS — HR 97 | Temp 96.6°F

## 2022-05-10 DIAGNOSIS — R6889 Other general symptoms and signs: Secondary | ICD-10-CM | POA: Diagnosis not present

## 2022-05-10 DIAGNOSIS — Z1152 Encounter for screening for COVID-19: Secondary | ICD-10-CM | POA: Diagnosis not present

## 2022-05-10 LAB — POCT INFLUENZA A/B
Influenza A, POC: NEGATIVE
Influenza B, POC: NEGATIVE

## 2022-05-10 LAB — POC COVID19 BINAXNOW: SARS Coronavirus 2 Ag: POSITIVE — AB

## 2022-05-10 NOTE — Progress Notes (Signed)
THIS ENCOUNTER IS A VIRTUAL VISIT DUE TO COVID-19 - PATIENT WAS NOT SEEN IN THE OFFICE.  PATIENT HAS CONSENTED TO VIRTUAL VISIT / TELEMEDICINE VISIT   Virtual Visit via telephone Note  I connected with  Wayne [name] on 05/10/2022 by telephone.  I verified that I am speaking with the correct person using two identifiers.    I discussed the limitations of evaluation and management by telemedicine and the availability of in person appointments. The patient expressed understanding and agreed to proceed.  History of Present Illness:  Pulse 97   Temp (!) 96.6 F (35.9 C)   SpO2 94%  71 y.o. patient contacted office reporting URI sx sore throat, cough, fever, chills. he tested positive by rapid POC. OV was conducted by telephone to minimize exposure. This patient has been vaccinated for covid 19, last 2021.  Sx began 6 days ago with sore throat.  Treatments tried so far: Mucinex  Exposures: Unknown   Medications  Current Outpatient Medications (Endocrine & Metabolic):    Dulaglutide (TRULICITY) 4.5 0000000 SOPN, Inject 4.5 mg into the Skin Once  Weekly   for Diabetes  ( Dx:   e11.29 )   metFORMIN (GLUCOPHAGE-XR) 500 MG 24 hr tablet, Take  2 tablets  2 x /day with  Meals for Diabetes                                    /                                    TAKE                              BY                      MOUTH  Current Outpatient Medications (Cardiovascular):    atenolol (TENORMIN) 100 MG tablet, TAKE ONE TABLET BY MOUTH DAILY FOR BLOOD PRESSURE   losartan-hydrochlorothiazide (HYZAAR) 100-25 MG tablet, TAKE ONE TABLET BY MOUTH ONE TIME DAILY   rosuvastatin (CRESTOR) 20 MG tablet, Take  1 tablet  Daily  for Cholesterol                                         /                       TAKE                                         BY                                             MOUTH  ONE TIME DAILY   tadalafil (CIALIS) 20 MG tablet,  Take 1/2 to 1 tablet every 2 to 3 days as needed for XXXX tadalafil (CIALIS) 20 MG tablet 30 tablet 2 11/20/2021 Sent to pharmacy as: Tadalafil 20 MG Oral Tablet (Cialis) E-Prescribing Status: Receipt confirmed by pharmacy Orlinda Blalock)  (11/20/2021 12:04 AM EDT)   Current Outpatient Medications (Analgesics):    Acetaminophen (TYLENOL PO), Take by mouth.   aspirin 81 MG tablet, Take 81 mg by mouth daily.   ibuprofen (ADVIL) 800 MG tablet, Take 800 mg by mouth every 8 (eight) hours as needed.   Current Outpatient Medications (Other):    citalopram (CELEXA) 40 MG tablet, Take  1 tablet  Daily for Mood                                                                 /                                TAKE BY MOUTH DAILY   Continuous Blood Gluc Sensor (FREESTYLE LIBRE 14 DAY SENSOR) MISC, USE TO CHECK BLOOD SUGARS THREE TIMES A DAY AS DIRECTED   Magnesium 400 MG CAPS, Take 1 capsuile Daily (Patient taking differently: 500 mg. Takes 2 capsules Daily)   VITAMIN D PO, Take 10,000 Units by mouth daily.    zinc gluconate 50 MG tablet, Take 50 mg by mouth daily.   glucose blood (ONETOUCH VERIO) test strip, Check blood sugar 1 time a day-DX-E11.22.   nystatin (MYCOSTATIN) 100000 UNIT/ML suspension, 5 ml four times a day, retain in mouth as long as possible (Swish and Spit).  Use for 48 hours after symptoms resolve. (Patient not taking: Reported on 05/10/2022)   raNITIdine HCl (ZANTAC PO), Take by mouth. (Patient not taking: Reported on 05/10/2022)  Allergies: No Known Allergies  Problem list He has HTN (hypertension); Hyperlipidemia associated with type 2 diabetes mellitus (HCC); GERD (gastroesophageal reflux disease); Anxiety; Type 2 diabetes mellitus with stage 2 chronic kidney disease, without long-term current use of insulin (HCC); OSA (obstructive sleep apnea); IBS (irritable bowel syndrome); Vitamin D deficiency; Morbid obesity (HCC) - BMI 30+ with sleep apnea; Testosterone deficiency; Former smoker; CKD stage  2 due to type 2 diabetes mellitus (HCC); CAD (coronary artery disease); Aortic atherosclerosis (HCC) by Chest CT scan 06.23.2020; and Chronic left-sided low back pain with left-sided sciatica on their problem list.   Social History:   reports that he quit smoking about 14 years ago. His smoking use included cigarettes. He started smoking about 34 years ago. He has a 19.00 pack-year smoking history. He has never used smokeless tobacco. He reports current alcohol use. He reports that he does not use drugs.  Observations/Objective:  General : Well sounding patient in no apparent distress HEENT: no hoarseness, no cough for duration of visit Lungs: speaks in complete sentences, no audible wheezing, no apparent distress Neurological: alert, oriented x 3 Psychiatric: pleasant, judgement appropriate   Assessment and Plan:  Covid 19 Covid 19 positive per rapid screening test in office parking lot Risk factors include: DM2, HTN Symptoms are: mild Due to co morbid conditions and risk factors, discussed antivirals Molnupiravir however patient past treatment window of 5 days.  Immue support reviewed including vitamin c, zinc. Take tylenol PRN temp 101+ Push hydration Regular ambulation or calf exercises exercises for clot prevention and 81 mg ASA unless contraindicated Sx supportive therapy suggested Follow up via mychart or telephone if needed Advised patient obtain O2 monitor; present to ED if persistently <90% or with severe dyspnea, CP, fever uncontrolled by tylenol, confusion, sudden decline Should remain in isolation until at least 5 days from onset of sx, 24-48 hours fever free without tylenol, sx such as cough are improved.    Assessment & Plan  Flu-like symptoms Negative  - POCT Influenza A/B  Encounter for screening for COVID-19 POSITIVE  - POC COVID-19   Follow Up Instructions:  I discussed the assessment and treatment plan with the patient. The patient was provided an  opportunity to ask questions and all were answered. The patient agreed with the plan and demonstrated an understanding of the instructions.   The patient was advised to call back or seek an in-person evaluation if the symptoms worsen or if the condition fails to improve as anticipated.  I provided 15 minutes of non-face-to-face time during this encounter.   Darrol Jump, NP

## 2022-05-24 ENCOUNTER — Other Ambulatory Visit: Payer: Self-pay | Admitting: Nurse Practitioner

## 2022-05-24 DIAGNOSIS — I10 Essential (primary) hypertension: Secondary | ICD-10-CM

## 2022-05-27 ENCOUNTER — Other Ambulatory Visit: Payer: Self-pay | Admitting: Internal Medicine

## 2022-05-27 MED ORDER — TRULICITY 4.5 MG/0.5ML ~~LOC~~ SOAJ: SUBCUTANEOUS | 3 refills | Status: DC

## 2022-06-08 ENCOUNTER — Encounter: Payer: BC Managed Care – PPO | Admitting: Internal Medicine

## 2022-06-28 ENCOUNTER — Other Ambulatory Visit: Payer: Self-pay | Admitting: Nurse Practitioner

## 2022-06-28 DIAGNOSIS — I10 Essential (primary) hypertension: Secondary | ICD-10-CM

## 2022-07-04 ENCOUNTER — Encounter: Payer: BC Managed Care – PPO | Admitting: Internal Medicine

## 2022-07-05 NOTE — Progress Notes (Deleted)
MEDICARE ANNUAL WELLNESS AND 3 MONTH FOLLOW UP  Assessment and Plan:   Encounter for Medicare Annual Wellness Due Yearly Will schedule Ophthalmology exam  Atherosclerosis of aorta ([place])  -per CT 10/2018 Control blood pressure, cholesterol, glucose, increase exercise.   CAD Control blood pressure, cholesterol, glucose, increase exercise.  Est with Dr. Bettina Gavia  Hypertension Well controlled with current medications; on ARB Monitor blood pressure at home; patient to call if consistently greater than 130/80 Continue DASH diet.   Reminder to go to the ER if any CP, SOB, nausea, dizziness, severe HA, changes vision/speech, left arm numbness and tingling and jaw pain. - CBC, CMP  Hyperlipidemia assoc with Type 2 DM Continue statin medication for LDL goal <70 Continue low cholesterol diet and exercise.  Check lipid panel.   Erectile Dysfunction related to Type 2 Diabetes mellitus Continue cialis, if no improvement will refer to urologist  Diabetes with diabetic chronic kidney disease Continue medication: metformin, tulicity RESTART CHECKING SUGARS Continue diet and exercise.  Perform daily foot/skin check, notify office of any concerning changes.  Check A1C  CKD stage 2 related to Type 2 DM Increase fluids, avoid NSAIDS, monitor sugars, will monitor -CMP  Anxiety Well managed by current regimen;  Stress management techniques discussed, increase water, good sleep hygiene discussed, increase exercise, and increase veggies.   Overweight Long discussion about weight loss, diet, and exercise Discussed ideal weight for height and initial weight goal Patient will work on portion sizes and gentle exercise Will follow up in 3 months  Vitamin D Def/ osteoporosis prevention Continue supplementation; advised to reduce from 10000 IU to 7000-8000 IU, 5000 IU was too low in the past Check vitamin D  OSA Discussed risks of complications with CPAP non-compliance including cardiac  arrhythmias; he requests referral back to Dr. Brett Fairy  GERD Initiated medication for new reflux related symptoms: famotidine 20 mg BID Discussed diet, avoiding triggers and other lifestyle changes  Medication Management Continued  Continue diet and meds as discussed. Further disposition pending results of labs. Discussed med's effects and SE's.   Over 30 minutes of exam, counseling, chart review, and critical decision making was performed.   Future Appointments  Date Time Provider [place]  07/06/2022  4:00 PM Alycia Rossetti, NP [facility] None  11/09/2022  2:00 PM Unk Pinto, MD [facility] None    Plan:   During the course of the visit the patient was educated and counseled about appropriate screening and preventive services including:   Pneumococcal vaccine  Prevnar 13 Influenza vaccine Td vaccine Screening electrocardiogram Bone densitometry screening Colorectal cancer screening Diabetes screening Glaucoma screening Nutrition counseling  Advanced directives: requested  ----------------------------------------------------------------------------------------------------------------------  HPI 72 y.o. male  presents for 3 month follow up on hypertension, cholesterol, T2 diabetes, CKD, vitamin D def, CAD, anxiety.  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  He is enjoying this and keeps him active.   he has a diagnosis of anxiety and is currently prescribed celexa 40 mg daily, reports symptoms are well controlled on current regimen.   He is on CPAP for mild/mod OSA per sleep study 10/17/2018 by Dr. Brett Fairy, reports never got machine fully adjusted, hasn't used in over 1 year.   BMI is There is no height or weight on file to calculate BMI., he has been working on diet and exercises, trying to use stairs more, less exertional dyspnea.  Wt Readings from Last 3 Encounters:  04/03/22 213 lb 12.8 oz (97 kg)  03/23/22 214 lb (97.1 kg)  12/30/21 211 lb  9.6 oz (96 kg)   he has a diagnosis of GERD which is currently managed by protonix 40 mg daily after poor control with H2i   He has been having headaches since starting the Cialis. Excedrin does help relieve headaches.  He is not having more erections since starting the Cialis but has only been on for a short period of time.    He has aortic atherosclerosis per CT 10/2018. Coronary calcium score 301, then had CTA with about 50 % at LAD per Dr. Bettina Gavia.   His blood pressure has been controlled at home (110-120s/70-80s), today their BP is    BP Readings from Last 3 Encounters:  04/03/22 124/78  03/23/22 112/64  12/30/21 100/62   He does workout. He denies chest pain, shortness of breath. He has had some episodes of dizziness with position changes, admits typically on days working out of the office and not hydrating well    He is on cholesterol medication (rosuvasatin 20 mg daily) and denies myalgias. His cholesterol is at goal. The cholesterol last visit was:   Lab Results  Component Value Date   CHOL 156 04/03/2022   HDL 46 04/03/2022   LDLCALC 92 04/03/2022   TRIG 89 04/03/2022   CHOLHDL 3.4 04/03/2022    He has been working on diet and exercise for T2 diabetes (on metformin [id number] mg daily, trulicity 4.5 mg/week), and denies increased appetite, nausea, paresthesia of the feet, polydipsia, polyuria, visual disturbances and vomiting.   Blood sugars are not being checked.  Has had some constipation on Trulicity, has not been drinking enough water.  He knows he is due for ophthalmology exam and plans to schedule has been 2 years. Last A1C in the office was:  Lab Results  Component Value Date   HGBA1C 6.4 (H) 04/03/2022   Lab Results  Component Value Date   EGFR 86 04/03/2022   Patient is on Vitamin D supplement but remained below goal at the last check, hasn't changed dose, still taking 10000 IU daily, suggested he reduce to 7000-8000 IU daily  Lab Results  Component Value Date   VD25OH  82 04/03/2022       Current Medications:  Current Outpatient Medications on File Prior to Visit  Medication Sig   Acetaminophen (TYLENOL PO) Take by mouth.   aspirin 81 MG tablet Take 81 mg by mouth daily.   atenolol (TENORMIN) 100 MG tablet TAKE ONE TABLET BY MOUTH EVERY DAY FOR BLOOD PRESSURE   citalopram (CELEXA) 40 MG tablet Take  1 tablet  Daily for Mood                                                                 /                                TAKE BY MOUTH DAILY   Continuous Blood Gluc Sensor (FREESTYLE LIBRE 14 DAY SENSOR) MISC USE TO CHECK BLOOD SUGARS THREE TIMES A DAY AS DIRECTED   Dulaglutide (TRULICITY) 4.5 0000000 SOPN Inject 4.5 mg into the Skin Once  Weekly   for Diabetes  ( Dx:   e11.29 )   glucose blood (ONETOUCH VERIO) test  strip Check blood sugar 1 time a day-DX-E11.22.   ibuprofen (ADVIL) 800 MG tablet Take 800 mg by mouth every 8 (eight) hours as needed.   losartan-hydrochlorothiazide (HYZAAR) 100-25 MG tablet TAKE ONE TABLET BY MOUTH ONE TIME DAILY   Magnesium 400 MG CAPS Take 1 capsuile Daily (Patient taking differently: 500 mg. Takes 2 capsules Daily)   metFORMIN (GLUCOPHAGE-XR) 500 MG 24 hr tablet Take  2 tablets  2 x /day with  Meals for Diabetes                                    /                                    TAKE                              BY                      MOUTH   rosuvastatin (CRESTOR) 20 MG tablet Take  1 tablet  Daily  for Cholesterol                                         /                       TAKE                                         BY                                             MOUTH                                                    ONE TIME DAILY   tadalafil (CIALIS) 20 MG tablet Take 1/2 to 1 tablet every 2 to 3 days as needed for XXXX tadalafil (CIALIS) 20 MG tablet 30 tablet 2 11/20/2021 Sent to pharmacy as: Tadalafil 20 MG Oral Tablet (Cialis) E-Prescribing Status: Receipt confirmed by pharmacy ( [facility])  (11/20/2021 12:04 AM EDT)    VITAMIN D PO Take 10,000 Units by mouth daily.    zinc gluconate 50 MG tablet Take 50 mg by mouth daily.   No current facility-administered medications on file prior to visit.     Allergies: No Known Allergies   Medical History:  Past Medical History:  Diagnosis Date   Allergy    Anxiety    On Xanax   Chest pain    Fatigue    GERD (gastroesophageal reflux disease)    Heart murmur    hx of MVP    HTN (hypertension)    Hyperlipemia    IBS (irritable bowel syndrome)    Medication management 06/13/2013   Mitral valve prolapse  Obesity (BMI 30.0-34.9) 08/20/2015   OSA (obstructive sleep apnea)    Restated CPAP   Sleep apnea    no cpap    T2_NIDDM    Testosterone deficiency 08/20/2015   Type II or unspecified type diabetes mellitus without mention of complication, not stated as uncontrolled    Vitamin D deficiency    Family history- Reviewed and unchanged Social history- Reviewed and unchanged  Health Maintenance  Topic Date Due   Zoster Vaccines- Shingrix (1 of 2) Never done   OPHTHALMOLOGY EXAM  08/10/2021   INFLUENZA VACCINE  12/13/2021   COVID-19 Vaccine (4 - 2023-24 season) 01/13/2022   COLONOSCOPY (Pts 45-4yr Insurance coverage will need to be confirmed)  03/30/2022   FOOT EXAM  06/06/2022   HEMOGLOBIN A1C  10/02/2022   Diabetic kidney evaluation - Urine ACR  12/31/2022   Medicare Annual Wellness (AWV)  12/31/2022   Diabetic kidney evaluation - eGFR measurement  04/04/2023   DTaP/Tdap/Td (3 - Td or Tdap) 01/31/2024   Pneumonia Vaccine 72 Years old  Completed   Hepatitis C Screening  Completed   HPV VACCINES  Aged Out   Immunization History  Administered Date(s) Administered   Influenza Inj Mdck Quad With Preservative 01/29/2017   Influenza Split 03/01/2015   Influenza, High Dose Seasonal PF 03/19/2018, 04/23/2019, 03/08/2020, 03/15/2021   Influenza,inj,quad, With Preservative 03/03/2016   Moderna Sars-Covid-2 Vaccination 06/15/2019, 07/13/2019,  05/03/2020   PPD Test 01/30/2014, 01/29/2017, 03/19/2018, 07/28/2020   Pneumococcal Conjugate-13 03/19/2018   Pneumococcal Polysaccharide-23 05/21/2008, 04/23/2019   Td 10/30/2003   Tdap 01/30/2014    MEDICARE WELLNESS OBJECTIVES: Physical activity:   Cardiac risk factors:   Depression/mood screen:      04/03/2022   12:18 AM  Depression screen PHQ 2/9  Decreased Interest 0  Down, Depressed, Hopeless 0  PHQ - 2 Score 0    ADLs:     04/03/2022   12:18 AM 12/30/2021   11:19 AM  In your present state of health, do you have any difficulty performing the following activities:  Hearing? 0 0  Vision? 0 0  Difficulty concentrating or making decisions? 0 0  Walking or climbing stairs? 0 0  Dressing or bathing? 0 0  Doing errands, shopping? 0 0     Cognitive Testing  Alert? Yes  Normal Appearance?Yes  Oriented to person? Yes  Place? Yes   Time? Yes  Recall of three objects?  Yes  Can perform simple calculations? Yes  Displays appropriate judgment?Yes  Can read the correct time from a watch face?Yes  EOL planning:       Review of Systems:  Review of Systems  Constitutional:  Negative for malaise/fatigue and weight loss.  HENT:  Negative for hearing loss and tinnitus.   Eyes:  Negative for blurred vision and double vision.  Respiratory:  Negative for cough, shortness of breath and wheezing.   Cardiovascular:  Negative for chest pain, palpitations, orthopnea, claudication and leg swelling.  Gastrointestinal:  Positive for heartburn. Negative for abdominal pain, blood in stool, constipation, diarrhea, melena, nausea and vomiting.  Genitourinary: Negative.        Erectile dysfunction- difficulty obtaining erection  Musculoskeletal:  Negative for joint pain and myalgias.  Skin:  Negative for rash.  Neurological:  Negative for dizziness, tingling, sensory change, weakness and headaches.  Endo/Heme/Allergies:  Negative for polydipsia.  Psychiatric/Behavioral:  Negative for  depression and substance abuse. The patient is not nervous/anxious and does not have insomnia.   All other systems reviewed and  are negative.   Physical Exam: There were no vitals taken for this visit. Wt Readings from Last 3 Encounters:  04/03/22 213 lb 12.8 oz (97 kg)  03/23/22 214 lb (97.1 kg)  12/30/21 211 lb 9.6 oz (96 kg)   General Appearance: Well nourished, in no apparent distress. Eyes: PERRLA, EOMs, conjunctiva no swelling or erythema Sinuses: No Frontal/maxillary tenderness ENT/Mouth: Ext aud canals clear, TMs without erythema, bulging. No erythema, swelling, or exudate on post pharynx.  Tonsils not swollen or erythematous. Hearing normal.  Neck: Supple, thyroid normal.  Respiratory: Respiratory effort normal, BS equal bilaterally without rales, rhonchi, wheezing or stridor.  Cardio: RRR with no MRGs. Brisk peripheral pulses without edema.  Abdomen: Soft, + BS.  Mild epigastric tenderness, no guarding, rebound, hernias, masses. Lymphatics: Non tender without lymphadenopathy.  Musculoskeletal: Full ROM, 5/5 strength, Normal gait Skin: Warm, dry without rashes, lesions, ecchymosis.  Neuro: Cranial nerves intact. No cerebellar symptoms.  Psych: Awake and oriented X 3, normal affect, Insight and Judgment appropriate.    Medicare Attestation I have personally reviewed: The patient's medical and social history Their use of alcohol, tobacco or illicit drugs Their current medications and supplements The patient's functional ability including ADLs,fall risks, home safety risks, cognitive, and hearing and visual impairment Diet and physical activities Evidence for depression or mood disorders  The patient's weight, height, BMI, and visual acuity have been recorded in the chart.  I have made referrals, counseling, and provided education to the patient based on review of the above and I have provided the patient with a written personalized care plan for preventive services.      Alycia Rossetti, NP 1:13 PM [facility]

## 2022-07-06 ENCOUNTER — Ambulatory Visit: Payer: BC Managed Care – PPO | Admitting: Nurse Practitioner

## 2022-07-06 DIAGNOSIS — I2510 Atherosclerotic heart disease of native coronary artery without angina pectoris: Secondary | ICD-10-CM

## 2022-07-06 DIAGNOSIS — G4733 Obstructive sleep apnea (adult) (pediatric): Secondary | ICD-10-CM

## 2022-07-06 DIAGNOSIS — I700 Atherosclerosis of aorta: Secondary | ICD-10-CM

## 2022-07-06 DIAGNOSIS — I10 Essential (primary) hypertension: Secondary | ICD-10-CM

## 2022-07-06 DIAGNOSIS — N182 Chronic kidney disease, stage 2 (mild): Secondary | ICD-10-CM

## 2022-07-06 DIAGNOSIS — E1169 Type 2 diabetes mellitus with other specified complication: Secondary | ICD-10-CM

## 2022-07-06 DIAGNOSIS — K219 Gastro-esophageal reflux disease without esophagitis: Secondary | ICD-10-CM

## 2022-07-06 DIAGNOSIS — Z79899 Other long term (current) drug therapy: Secondary | ICD-10-CM

## 2022-07-06 DIAGNOSIS — E785 Hyperlipidemia, unspecified: Secondary | ICD-10-CM

## 2022-07-06 DIAGNOSIS — E559 Vitamin D deficiency, unspecified: Secondary | ICD-10-CM

## 2022-07-06 DIAGNOSIS — E663 Overweight: Secondary | ICD-10-CM

## 2022-07-06 DIAGNOSIS — Z0000 Encounter for general adult medical examination without abnormal findings: Secondary | ICD-10-CM

## 2022-07-29 ENCOUNTER — Encounter (HOSPITAL_COMMUNITY): Payer: Self-pay

## 2022-07-29 ENCOUNTER — Ambulatory Visit (HOSPITAL_COMMUNITY)
Admission: EM | Admit: 2022-07-29 | Discharge: 2022-07-29 | Disposition: A | Discharge status: Home / Self Care | Payer: PPO | Attending: Emergency Medicine | Admitting: Emergency Medicine

## 2022-07-29 DIAGNOSIS — J069 Acute upper respiratory infection, unspecified: Secondary | ICD-10-CM | POA: Diagnosis not present

## 2022-07-29 MED ORDER — PROMETHAZINE-DM 6.25-15 MG/5ML PO SYRP: 5.0000 mL | ORAL_SOLUTION | Freq: Three times a day (TID) | ORAL | 0 refills | Status: DC | PRN

## 2022-07-29 MED ORDER — AZITHROMYCIN 250 MG PO TABS: 250.0000 mg | ORAL_TABLET | Freq: Every day | ORAL | 0 refills | Status: DC

## 2022-07-29 MED ORDER — BENZONATATE 100 MG PO CAPS: 100.0000 mg | ORAL_CAPSULE | Freq: Three times a day (TID) | ORAL | 0 refills | Status: DC

## 2022-07-29 NOTE — Discharge Instructions (Addendum)
I am covering you with antibiotics for bacterial upper respiratory infection.  Please take all antibiotics as prescribed until finished.  He can take the Tessalon Perles throughout the day for cough, you can take the cough syrup at night as needed for cough.  Please do not drink or drive on this medication as it may make you drowsy.  Please continue take Tylenol alternate with ibuprofen every 4-6 hours as needed for fevers, discomfort or headaches.  You can do warm saline gargles, tea with honey, or sleeping with a humidifier for symptomatic relief of your sore throat.  Please follow-up with clinic or with your primary care if your symptoms persist beyond the next week, you develop fever that does not resolve with medication, shortness of breath, or worsening of symptoms.

## 2022-07-29 NOTE — ED Triage Notes (Signed)
Patient c/o fever, headache, a productive cough with green sputum, and diarrhea x 9 days.  Patient states he has been taking Tylenol and Mucinex.  Patient states he has not had Tylenol in the past 12 hours.

## 2022-07-29 NOTE — ED Provider Notes (Signed)
Vivian    CSN: [id number] Arrival date & time: 07/29/22  1334      History   Chief Complaint Chief Complaint  Patient presents with   Fever   Headache   Diarrhea    HPI Wayne [name] is a 72 y.o. male.   Patient reports fever, night sweats, headache, productive cough with green sputum for the past 9 days.  Initially he had a headache, chills that he started taking Tylenol with.  Reports subjective fever, was sweating through his shirts at night.  Last Wednesday he thinks he might of had food poisoning because he had 1 episode of diarrhea and 1 episode of emesis after eating in the cafeteria.  These have not happened since.  Reports sweating through his shirt last night.  Sputum is brown and green in color.  Reports current headache.  Denies chest pain, abdominal pain, nausea, vomiting diarrhea, or bodyaches.  The history is provided by the patient.  Fever Associated symptoms: chills, cough, headaches and sore throat   Associated symptoms: no chest pain, no diarrhea, no dysuria, no ear pain, no rash and no vomiting   Headache Associated symptoms: cough, fever and sore throat   Associated symptoms: no abdominal pain, no back pain, no diarrhea, no ear pain, no eye pain, no seizures and no vomiting   Diarrhea Associated symptoms: chills, fever and headaches   Associated symptoms: no abdominal pain, no arthralgias and no vomiting     Past Medical History:  Diagnosis Date   Allergy    Anxiety    On Xanax   Chest pain    Fatigue    GERD (gastroesophageal reflux disease)    Heart murmur    hx of MVP    HTN (hypertension)    Hyperlipemia    IBS (irritable bowel syndrome)    Medication management 06/13/2013   Mitral valve prolapse    Obesity (BMI 30.0-34.9) 08/20/2015   OSA (obstructive sleep apnea)    Restated CPAP   Sleep apnea    no cpap    T2_NIDDM    Testosterone deficiency 08/20/2015   Type II or unspecified type diabetes mellitus without mention of  complication, not stated as uncontrolled    Vitamin D deficiency     Patient Active Problem List   Diagnosis Date Noted   Chronic left-sided low back pain with left-sided sciatica 10/03/2019   CKD stage 2 due to type 2 diabetes mellitus ([place]) 10/02/2019   CAD (coronary artery disease) 10/02/2019   Aortic atherosclerosis ([place]) by Chest CT scan 06.23.2020 10/02/2019   Former smoker 03/19/2018   Morbid obesity ([place]) - BMI 30+ with sleep apnea 08/20/2015   Testosterone deficiency 08/20/2015   Vitamin D deficiency 06/13/2013   Type 2 diabetes mellitus with stage 2 chronic kidney disease, without long-term current use of insulin (HCC)    OSA (obstructive sleep apnea)    IBS (irritable bowel syndrome)    HTN (hypertension)    Hyperlipidemia associated with type 2 diabetes mellitus (HCC)    GERD (gastroesophageal reflux disease)    Anxiety     Past Surgical History:  Procedure Laterality Date   COLONOSCOPY     TONSILLECTOMY     WISDOM TOOTH EXTRACTION         Home Medications    Prior to Admission medications   Medication Sig Start Date End Date Taking? Authorizing Provider  azithromycin (ZITHROMAX) 250 MG tablet Take 1 tablet (250 mg total) by mouth daily. Take first  2 tablets together, then 1 every day until finished. 07/29/22  Yes Louretta Shorten, [place], FNP  benzonatate (TESSALON) 100 MG capsule Take 1 capsule (100 mg total) by mouth every 8 (eight) hours. 07/29/22  Yes Louretta Shorten, [place], FNP  promethazine-dextromethorphan (PROMETHAZINE-DM) 6.25-15 MG/5ML syrup Take 5 mLs by mouth 3 (three) times daily as needed for cough. 07/29/22  Yes Louretta Shorten, [place], FNP  Acetaminophen (TYLENOL PO) Take by mouth.    [provider]  aspirin 81 MG tablet Take 81 mg by mouth daily.    [provider]  atenolol (TENORMIN) 100 MG tablet TAKE ONE TABLET BY MOUTH EVERY DAY FOR BLOOD PRESSURE 05/24/22   Darrol Jump, NP  citalopram (CELEXA) 40 MG tablet Take  1 tablet  Daily for  Mood                                                                 /                                TAKE BY MOUTH DAILY 06/04/21   Unk Pinto, MD  Continuous Blood Gluc Sensor (FREESTYLE LIBRE 14 DAY SENSOR) MISC USE TO CHECK BLOOD SUGARS THREE TIMES A DAY AS DIRECTED 03/22/21   Unk Pinto, MD  Dulaglutide (TRULICITY) 4.5 0000000 SOPN Inject 4.5 mg into the Skin Once  Weekly   for Diabetes  ( Dx:   e11.29 ) 05/27/22   Unk Pinto, MD  glucose blood (ONETOUCH VERIO) test strip Check blood sugar 1 time a day-DX-E11.22. 03/22/21   Unk Pinto, MD  ibuprofen (ADVIL) 800 MG tablet Take 800 mg by mouth every 8 (eight) hours as needed.    [provider]  losartan-hydrochlorothiazide (HYZAAR) 100-25 MG tablet TAKE ONE TABLET BY MOUTH ONE TIME DAILY 06/28/22   Darrol Jump, NP  Magnesium 400 MG CAPS Take 1 capsuile Daily Patient taking differently: 500 mg. Takes 2 capsules Daily 01/25/19   Unk Pinto, MD  metFORMIN (GLUCOPHAGE-XR) 500 MG 24 hr tablet Take  2 tablets  2 x /day with  Meals for Diabetes                                    /                                    TAKE                              BY                      MOUTH 12/27/21   Unk Pinto, MD  rosuvastatin (CRESTOR) 20 MG tablet Take  1 tablet  Daily  for Cholesterol                                         /  TAKE                                         BY                                             MOUTH                                                    ONE TIME DAILY 04/22/22   Unk Pinto, MD  tadalafil (CIALIS) 20 MG tablet Take 1/2 to 1 tablet every 2 to 3 days as needed for XXXX tadalafil (CIALIS) 20 MG tablet 30 tablet 2 11/20/2021 Sent to pharmacy as: Tadalafil 20 MG Oral Tablet (Cialis) E-Prescribing Status: Receipt confirmed by pharmacy ( [facility])  (11/20/2021 12:04 AM EDT) 11/24/21   Unk Pinto, MD  VITAMIN D PO Take 10,000 Units by mouth daily.     [provider]  zinc gluconate 50 MG tablet Take 50 mg by mouth daily.    [provider]    Family History Family History  Problem Relation Age of Onset   Stroke Other    Hypertension Other    Hyperlipidemia Other    Diabetes Other    Colon cancer Father 56   Hypertension Father    Dementia Mother    Alzheimer's disease Mother    Colon polyps Neg Hx    Esophageal cancer Neg Hx    Rectal cancer Neg Hx    Stomach cancer Neg Hx     Social History Social History   Tobacco Use   Smoking status: Former    Packs/day: 1.00    Years: 19.00    Additional pack years: 0.00    Total pack years: 19.00    Types: Cigarettes    Start date: 35    Quit date: 09/06/2007    Years since quitting: 14.9   Smokeless tobacco: Never  Vaping Use   Vaping Use: Never used  Substance Use Topics   Alcohol use: Yes    Comment: occasional   Drug use: No     Allergies   Patient has no known allergies.   Review of Systems Review of Systems  Constitutional:  Positive for chills and fever.  HENT:  Positive for sore throat. Negative for ear pain.   Eyes:  Negative for pain and visual disturbance.  Respiratory:  Positive for cough. Negative for shortness of breath.   Cardiovascular:  Negative for chest pain and palpitations.  Gastrointestinal:  Negative for abdominal pain, diarrhea and vomiting.  Genitourinary:  Negative for dysuria and hematuria.  Musculoskeletal:  Negative for arthralgias and back pain.  Skin:  Negative for color change and rash.  Neurological:  Positive for headaches. Negative for seizures and syncope.  All other systems reviewed and are negative.    Physical Exam Triage Vital Signs ED Triage Vitals [07/29/22 1355]  Enc Vitals Group     BP      Pulse      Resp      Temp      Temp src      SpO2  Weight      Height      Head Circumference      Peak Flow      Pain Score 0     Pain Loc      Pain Edu?      Excl. in [place]?    No data  found.  Updated Vital Signs BP (!) 149/74 (BP Location: Left Arm)   Pulse (!) 113   Temp 99 F (37.2 C) (Oral)   Resp 18   SpO2 96%   Visual Acuity Right Eye Distance:   Left Eye Distance:   Bilateral Distance:    Right Eye Near:   Left Eye Near:    Bilateral Near:     Physical Exam Vitals and nursing note reviewed.  Constitutional:      General: He is not in acute distress.    Appearance: He is well-developed.  HENT:     Head: Normocephalic and atraumatic.     Mouth/Throat:     Mouth: Mucous membranes are moist.  Eyes:     General: Lids are normal.     Conjunctiva/sclera: Conjunctivae normal.  Cardiovascular:     Rate and Rhythm: Normal rate and regular rhythm.     Heart sounds: Normal heart sounds, S1 normal and S2 normal. No murmur heard. Pulmonary:     Effort: Pulmonary effort is normal. No respiratory distress.     Breath sounds: Normal breath sounds.     Comments: Lungs vesicular posteriorly. Musculoskeletal:        General: No swelling. Normal range of motion.     Cervical back: Neck supple.  Lymphadenopathy:     Cervical: Cervical adenopathy present.  Skin:    General: Skin is warm and dry.     Capillary Refill: Capillary refill takes less than 2 seconds.  Neurological:     Mental Status: He is alert.     GCS: GCS eye subscore is 4. GCS verbal subscore is 5. GCS motor subscore is 6.  Psychiatric:        Mood and Affect: Mood normal.        Behavior: Behavior is cooperative.      UC Treatments / Results  Labs (all labs ordered are listed, but only abnormal results are displayed) Labs Reviewed - No data to display  EKG   Radiology No results found.  Procedures Procedures (including critical care time)  Medications Ordered in UC Medications - No data to display  Initial Impression / Assessment and Plan / UC Course  I have reviewed the triage vital signs and the nursing notes.  Pertinent labs & imaging results that were available during  my care of the patient were reviewed by me and considered in my medical decision making (see chart for details).  Vitals and triage reviewed, patient is hemodynamically stable.  Lungs vesicular posteriorly.  Reports 9 days of productive cough with yellow-green sputum, headaches.  Without sinus tenderness to palpation.  Afebrile without tachycardia.  Suspect bacterial etiology due to upper respiratory infection, will cover with azithromycin.  Symptomatic relief for cough and sore throat discussed, patient verbalized understanding.  Return precautions and follow-up care discussed, no questions at this time.    Final Clinical Impressions(s) / UC Diagnoses   Final diagnoses:  Upper respiratory tract infection, unspecified type     Discharge Instructions      I am covering you with antibiotics for bacterial upper respiratory infection.  Please take all antibiotics as prescribed until finished.  He  can take the Tessalon Perles throughout the day for cough, you can take the cough syrup at night as needed for cough.  Please do not drink or drive on this medication as it may make you drowsy.  Please continue take Tylenol alternate with ibuprofen every 4-6 hours as needed for fevers, discomfort or headaches.  You can do warm saline gargles, tea with honey, or sleeping with a humidifier for symptomatic relief of your sore throat.  Please follow-up with clinic or with your primary care if your symptoms persist beyond the next week, you develop fever that does not resolve with medication, shortness of breath, or worsening of symptoms.      ED Prescriptions     Medication Sig Dispense Auth. Provider   azithromycin (ZITHROMAX) 250 MG tablet Take 1 tablet (250 mg total) by mouth daily. Take first 2 tablets together, then 1 every day until finished. 6 tablet Louretta Shorten, [place], [place]   benzonatate (TESSALON) 100 MG capsule Take 1 capsule (100 mg total) by mouth every 8 (eight) hours. 21 capsule Louretta Shorten,  [place], [place]   promethazine-dextromethorphan (PROMETHAZINE-DM) 6.25-15 MG/5ML syrup Take 5 mLs by mouth 3 (three) times daily as needed for cough. 118 mL Daimian Sudberry, [place], [place]      PDMP not reviewed this encounter.   Redell Nazir, [place], [place] 07/29/22 1450

## 2022-08-09 NOTE — Progress Notes (Unsigned)
MEDICARE ANNUAL WELLNESS AND 3 MONTH FOLLOW UP  Assessment and Plan:   Encounter for Medicare Annual Wellness Due Yearly Will schedule Ophthalmology exam Due for colonoscopy- will call to schedule appointment  Atherosclerosis of aorta ([place])  -per CT 10/2018 Control blood pressure, cholesterol, glucose, increase exercise.   CAD Control blood pressure, cholesterol, glucose, increase exercise.  Established with Dr. Bettina Gavia, cardiology  Hypertension Well controlled with current medications; on ARB Monitor blood pressure at home; patient to call if consistently greater than 130/80 Continue DASH diet.   Reminder to go to the ER if any CP, SOB, nausea, dizziness, severe HA, changes vision/speech, left arm numbness and tingling and jaw pain.  Hyperlipidemia assoc with Type 2 DM Continue statin medication for LDL goal <70 Continue low cholesterol diet and exercise.  Check lipid panel.   Erectile Dysfunction related to Type 2 Diabetes mellitus Continue cialis, if no improvement will refer to urologist  Diabetes with diabetic chronic kidney disease Continue medication: metformin, Trulicity Had been off Trulicity x 1 month when he was sick with URI- encouraged to restart RESTART CHECKING SUGARS Continue diet and exercise.  Perform daily foot/skin check, notify office of any concerning changes.  Check A1C  CKD stage 2 related to Type 2 DM Increase fluids, avoid NSAIDS, monitor sugars, will monitor -CMP - Microalbumin/creatinine urine ratio  Anxiety Well managed by current regimen;  Stress management techniques discussed, increase water, good sleep hygiene discussed, increase exercise, and increase veggies.   Overweight Long discussion about weight loss, diet, and exercise Discussed ideal weight for height and initial weight goal Patient will work on portion sizes and gentle exercise Will follow up in 3 months  Vitamin D Def/ osteoporosis prevention Continue supplementation;  advised to reduce from 10000 IU to 7000-8000 IU, 5000 IU was too low in the past Check vitamin D  Plantar wart Referred to podiatry for treatment  OSA Discussed risks of complications with CPAP non-compliance including cardiac arrhythmias; he requests referral back to Dr. Brett Fairy  GERD Continue Protonix 40 mg QD, failed on Famotidine only Discussed diet, avoiding triggers and other lifestyle changes  Medication Management Continued  Continue diet and meds as discussed. Further disposition pending results of labs. Discussed med's effects and SE's.   Over 30 minutes of exam, counseling, chart review, and critical decision making was performed.   Future Appointments  Date Time Provider [place]  11/09/2022  2:00 PM Unk Pinto, MD [facility] None  08/10/2023 11:00 AM Alycia Rossetti, NP [facility] None    Plan:   During the course of the visit the patient was educated and counseled about appropriate screening and preventive services including:   Pneumococcal vaccine  Prevnar 13 Influenza vaccine Td vaccine Screening electrocardiogram Bone densitometry screening Colorectal cancer screening Diabetes screening Glaucoma screening Nutrition counseling  Advanced directives: requested  ----------------------------------------------------------------------------------------------------------------------  HPI 72 y.o. male  presents for 3 month follow up on hypertension, cholesterol, T2 diabetes, CKD, vitamin D def, CAD, anxiety.  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  He is enjoying this and keeps him active.   he has a diagnosis of anxiety and is currently prescribed celexa 40 mg daily, reports symptoms are well controlled on current regimen.   He was treated for URI with Zithromax 07/29/22.  Symptoms are now improved.  He has been having tenderness of left great toe, believed it was callus but shaved down and remains tender and area remains.    He  is on CPAP for mild/mod OSA  per sleep study 10/17/2018 by Dr. Brett Fairy, reports never got machine fully adjusted, hasn't used in several years.   BMI is Body mass index is 29.51 kg/m., he has been working on diet and exercises, trying to use stairs more, less exertional dyspnea. He hasn't used his Trulicity in the past month because he was ill. Wt Readings from Last 3 Encounters:  08/10/22 217 lb 9.6 oz (98.7 kg)  04/03/22 213 lb 12.8 oz (97 kg)  03/23/22 214 lb (97.1 kg)   he has a diagnosis of GERD which is currently managed by over the counter omeprazole  He is on Cialis for erectile dysfunction- takes as needed  He has aortic atherosclerosis per CT 10/2018. Coronary calcium score 301, then had CTA with about 50 % at LAD per Dr. Bettina Gavia.   His blood pressure has been controlled at home (110-120s/70-80s), today their BP is BP: 124/62  BP Readings from Last 3 Encounters:  08/10/22 124/62  07/29/22 (!) 149/74  04/03/22 124/78   He does workout. He denies chest pain, shortness of breath. He has had some episodes of dizziness with position changes, admits typically on days working out of the office and not hydrating well    He is on cholesterol medication (rosuvasatin 20 mg daily) and denies myalgias. His cholesterol is at goal. The cholesterol last visit was:   Lab Results  Component Value Date   CHOL 156 04/03/2022   HDL 46 04/03/2022   LDLCALC 92 04/03/2022   TRIG 89 04/03/2022   CHOLHDL 3.4 04/03/2022    He has been working on diet and exercise for T2 diabetes (on metformin AB-[id number] mg daily, trulicity 4.5 mg/week), and denies increased appetite, nausea, paresthesia of the feet, polydipsia, polyuria, visual disturbances and vomiting.   Blood sugars are not being checked.    Has been off Trulicity x 4 weeks because he was sick He knows he is due for ophthalmology exam and plans to schedule has been 2 years. Last A1C in the office was:  Lab Results  Component Value Date   HGBA1C 6.4 (H)  04/03/2022   Lab Results  Component Value Date   EGFR 86 04/03/2022   Patient is on Vitamin D supplement but remained below goal at the last check, hasn't changed dose, still taking 10000 IU daily, suggested he reduce to 7000-8000 IU daily  Lab Results  Component Value Date   VD25OH 82 04/03/2022       Current Medications:  Current Outpatient Medications on File Prior to Visit  Medication Sig   Acetaminophen (TYLENOL PO) Take by mouth.   aspirin 81 MG tablet Take 81 mg by mouth daily.   atenolol (TENORMIN) 100 MG tablet TAKE ONE TABLET BY MOUTH EVERY DAY FOR BLOOD PRESSURE   citalopram (CELEXA) 40 MG tablet Take  1 tablet  Daily for Mood                                                                 /                                TAKE BY MOUTH DAILY   Continuous Blood Gluc Sensor (FREESTYLE LIBRE 14 DAY SENSOR) MISC USE  TO CHECK BLOOD SUGARS THREE TIMES A DAY AS DIRECTED   Dulaglutide (TRULICITY) 4.5 0000000 SOPN Inject 4.5 mg into the Skin Once  Weekly   for Diabetes  ( Dx:   e11.29 )   glucose blood (ONETOUCH VERIO) test strip Check blood sugar 1 time a day-DX-E11.22.   ibuprofen (ADVIL) 800 MG tablet Take 800 mg by mouth every 8 (eight) hours as needed.   losartan-hydrochlorothiazide (HYZAAR) 100-25 MG tablet TAKE ONE TABLET BY MOUTH ONE TIME DAILY   Magnesium 400 MG CAPS Take 1 capsuile Daily (Patient taking differently: 500 mg. Takes 2 capsules Daily)   metFORMIN (GLUCOPHAGE-XR) 500 MG 24 hr tablet Take  2 tablets  2 x /day with  Meals for Diabetes                                    /                                    TAKE                              BY                      MOUTH   rosuvastatin (CRESTOR) 20 MG tablet Take  1 tablet  Daily  for Cholesterol                                         /                       TAKE                                         BY                                             MOUTH                                                    ONE TIME DAILY    tadalafil (CIALIS) 20 MG tablet Take 1/2 to 1 tablet every 2 to 3 days as needed for XXXX tadalafil (CIALIS) 20 MG tablet 30 tablet 2 11/20/2021 Sent to pharmacy as: Tadalafil 20 MG Oral Tablet (Cialis) E-Prescribing Status: Receipt confirmed by pharmacy ( [facility])  (11/20/2021 12:04 AM EDT)   VITAMIN D PO Take 10,000 Units by mouth daily.    zinc gluconate 50 MG tablet Take 50 mg by mouth daily.   azithromycin (ZITHROMAX) 250 MG tablet Take 1 tablet (250 mg total) by mouth daily. Take first 2 tablets together, then 1 every day until finished. (Patient not taking: Reported on 08/10/2022)   benzonatate (TESSALON) 100 MG capsule Take 1 capsule (100 mg total) by mouth every 8 (eight)  hours. (Patient not taking: Reported on 08/10/2022)   promethazine-dextromethorphan (PROMETHAZINE-DM) 6.25-15 MG/5ML syrup Take 5 mLs by mouth 3 (three) times daily as needed for cough. (Patient not taking: Reported on 08/10/2022)   No current facility-administered medications on file prior to visit.     Allergies: No Known Allergies   Medical History:  Past Medical History:  Diagnosis Date   Allergy    Anxiety    On Xanax   Chest pain    Fatigue    GERD (gastroesophageal reflux disease)    Heart murmur    hx of MVP    HTN (hypertension)    Hyperlipemia    IBS (irritable bowel syndrome)    Medication management 06/13/2013   Mitral valve prolapse    Obesity (BMI 30.0-34.9) 08/20/2015   OSA (obstructive sleep apnea)    Restated CPAP   Sleep apnea    no cpap    T2_NIDDM    Testosterone deficiency 08/20/2015   Type II or unspecified type diabetes mellitus without mention of complication, not stated as uncontrolled    Vitamin D deficiency    Family history- Reviewed and unchanged Social history- Reviewed and unchanged  Health Maintenance  Topic Date Due   FOOT EXAM  06/06/2022   INFLUENZA VACCINE  08/13/2022 (Originally 12/13/2021)   COVID-19 Vaccine (4 - 2023-24 season) 08/26/2022 (Originally 01/13/2022)    OPHTHALMOLOGY EXAM  10/10/2022 (Originally 08/10/2021)   COLONOSCOPY (Pts 45-68yrs Insurance coverage will need to be confirmed)  10/10/2022 (Originally 03/30/2022)   Zoster Vaccines- Shingrix (1 of 2) 11/10/2022 (Originally 05/16/1969)   HEMOGLOBIN A1C  10/02/2022   Diabetic kidney evaluation - Urine ACR  12/31/2022   Diabetic kidney evaluation - eGFR measurement  04/04/2023   Medicare Annual Wellness (AWV)  08/10/2023   DTaP/Tdap/Td (3 - Td or Tdap) 01/31/2024   Pneumonia Vaccine 58+ Years old  Completed   Hepatitis C Screening  Completed   HPV VACCINES  Aged Out   Immunization History  Administered Date(s) Administered   Influenza Inj Mdck Quad With Preservative 01/29/2017   Influenza Split 03/01/2015   Influenza, High Dose Seasonal PF 03/19/2018, 04/23/2019, 03/08/2020, 03/15/2021   Influenza,inj,quad, With Preservative 03/03/2016   Moderna Sars-Covid-2 Vaccination 06/15/2019, 07/13/2019, 05/03/2020   PPD Test 01/30/2014, 01/29/2017, 03/19/2018, 07/28/2020   Pneumococcal Conjugate-13 03/19/2018   Pneumococcal Polysaccharide-23 05/21/2008, 04/23/2019   Td 10/30/2003   Tdap 01/30/2014    MEDICARE WELLNESS OBJECTIVES: Physical activity: Current Exercise Habits: Home exercise routine, Type of exercise: walking, Time (Minutes): 15, Frequency (Times/Week): 7, Weekly Exercise (Minutes/Week): 105, Intensity: Mild, Exercise limited by: None identified Cardiac risk factors: Cardiac Risk Factors include: advanced age (>48men, >52 women);diabetes mellitus;dyslipidemia;hypertension;male gender;sedentary lifestyle Depression/mood screen:      08/10/2022   11:38 AM  Depression screen PHQ 2/9  Decreased Interest 0  Down, Depressed, Hopeless 0  PHQ - 2 Score 0    ADLs:     08/10/2022   11:38 AM 04/03/2022   12:18 AM  In your present state of health, do you have any difficulty performing the following activities:  Hearing? 0 0  Vision? 0 0  Difficulty concentrating or making decisions? 0 0   Walking or climbing stairs? 0 0  Dressing or bathing? 0 0  Doing errands, shopping? 0 0     Cognitive Testing  Alert? Yes  Normal Appearance?Yes  Oriented to person? Yes  Place? Yes   Time? Yes  Recall of three objects?  Yes  Can perform simple calculations? Yes  Displays  appropriate judgment?Yes  Can read the correct time from a watch face?Yes  EOL planning: Does Patient Have a Medical Advance Directive?: No Would patient like information on creating a medical advance directive?: No - Patient declined     Review of Systems:  Review of Systems  Constitutional:  Negative for malaise/fatigue and weight loss.  HENT:  Negative for hearing loss and tinnitus.   Eyes:  Negative for blurred vision and double vision.  Respiratory:  Negative for cough, shortness of breath and wheezing.   Cardiovascular:  Negative for chest pain, palpitations, orthopnea, claudication and leg swelling.  Gastrointestinal:  Positive for heartburn. Negative for abdominal pain, blood in stool, constipation, diarrhea, melena, nausea and vomiting.  Genitourinary: Negative.   Musculoskeletal:  Negative for joint pain and myalgias.  Skin:  Negative for rash.  Neurological:  Negative for dizziness, tingling, sensory change, weakness and headaches.  Endo/Heme/Allergies:  Negative for polydipsia.  Psychiatric/Behavioral:  Negative for depression and substance abuse. The patient is not nervous/anxious and does not have insomnia.   All other systems reviewed and are negative.   Physical Exam: BP 124/62   Pulse (!) 115   Temp (!) 97.5 F (36.4 C)   Ht 6' (1.829 m)   Wt 217 lb 9.6 oz (98.7 kg)   SpO2 94%   BMI 29.51 kg/m  Wt Readings from Last 3 Encounters:  08/10/22 217 lb 9.6 oz (98.7 kg)  04/03/22 213 lb 12.8 oz (97 kg)  03/23/22 214 lb (97.1 kg)   General Appearance: Well nourished, in no apparent distress. Eyes: PERRLA, EOMs, conjunctiva no swelling or erythema Sinuses: No Frontal/maxillary  tenderness ENT/Mouth: Ext aud canals clear, TMs without erythema, bulging. No erythema, swelling, or exudate on post pharynx.  Tonsils not swollen or erythematous. Hearing normal.  Neck: Supple, thyroid normal.  Respiratory: Respiratory effort normal, BS equal bilaterally without rales, rhonchi, wheezing or stridor.  Cardio: RRR with no MRGs. Brisk peripheral pulses without edema.  Abdomen: Soft, + BS.  Mild epigastric tenderness, no guarding, rebound, hernias, masses. Lymphatics: Non tender without lymphadenopathy.  Musculoskeletal: Full ROM, 5/5 strength, Normal gait Skin: Warm, dry . Left foot plantar wart at base of great toe. Neuro: Cranial nerves intact. No cerebellar symptoms. Sensation intact with monofilament Psych: Awake and oriented X 3, normal affect, Insight and Judgment appropriate.    Medicare Attestation I have personally reviewed: The patient's medical and social history Their use of alcohol, tobacco or illicit drugs Their current medications and supplements The patient's functional ability including ADLs,fall risks, home safety risks, cognitive, and hearing and visual impairment Diet and physical activities Evidence for depression or mood disorders  The patient's weight, height, BMI, and visual acuity have been recorded in the chart.  I have made referrals, counseling, and provided education to the patient based on review of the above and I have provided the patient with a written personalized care plan for preventive services.     Alycia Rossetti, NP 11:39 AM [facility]

## 2022-08-10 ENCOUNTER — Encounter: Payer: Self-pay | Admitting: Nurse Practitioner

## 2022-08-10 ENCOUNTER — Ambulatory Visit (INDEPENDENT_AMBULATORY_CARE_PROVIDER_SITE_OTHER): Payer: PPO | Admitting: Nurse Practitioner

## 2022-08-10 VITALS — BP 124/62 | HR 115 | Temp 97.5°F | Ht 72.0 in | Wt 217.6 lb

## 2022-08-10 DIAGNOSIS — E1169 Type 2 diabetes mellitus with other specified complication: Secondary | ICD-10-CM | POA: Diagnosis not present

## 2022-08-10 DIAGNOSIS — I2510 Atherosclerotic heart disease of native coronary artery without angina pectoris: Secondary | ICD-10-CM | POA: Diagnosis not present

## 2022-08-10 DIAGNOSIS — R6889 Other general symptoms and signs: Secondary | ICD-10-CM | POA: Diagnosis not present

## 2022-08-10 DIAGNOSIS — B070 Plantar wart: Secondary | ICD-10-CM

## 2022-08-10 DIAGNOSIS — E559 Vitamin D deficiency, unspecified: Secondary | ICD-10-CM | POA: Diagnosis not present

## 2022-08-10 DIAGNOSIS — Z79899 Other long term (current) drug therapy: Secondary | ICD-10-CM

## 2022-08-10 DIAGNOSIS — E785 Hyperlipidemia, unspecified: Secondary | ICD-10-CM

## 2022-08-10 DIAGNOSIS — I700 Atherosclerosis of aorta: Secondary | ICD-10-CM | POA: Diagnosis not present

## 2022-08-10 DIAGNOSIS — I10 Essential (primary) hypertension: Secondary | ICD-10-CM

## 2022-08-10 DIAGNOSIS — Z0001 Encounter for general adult medical examination with abnormal findings: Secondary | ICD-10-CM

## 2022-08-10 DIAGNOSIS — N182 Chronic kidney disease, stage 2 (mild): Secondary | ICD-10-CM | POA: Diagnosis not present

## 2022-08-10 DIAGNOSIS — E1122 Type 2 diabetes mellitus with diabetic chronic kidney disease: Secondary | ICD-10-CM | POA: Diagnosis not present

## 2022-08-10 DIAGNOSIS — N521 Erectile dysfunction due to diseases classified elsewhere: Secondary | ICD-10-CM

## 2022-08-10 DIAGNOSIS — E663 Overweight: Secondary | ICD-10-CM | POA: Diagnosis not present

## 2022-08-10 DIAGNOSIS — Z0000 Encounter for general adult medical examination without abnormal findings: Secondary | ICD-10-CM

## 2022-08-10 DIAGNOSIS — F419 Anxiety disorder, unspecified: Secondary | ICD-10-CM

## 2022-08-10 DIAGNOSIS — K219 Gastro-esophageal reflux disease without esophagitis: Secondary | ICD-10-CM

## 2022-08-10 NOTE — Patient Instructions (Addendum)
  Plantar Warts Plantar warts are small growths on the bottom of the foot (sole). Warts are caused by a type of germ (virus). Most warts are not painful, and they usually do not cause problems. Sometimes, plantar warts can cause pain when you walk. Warts often go away on their own in time. They can also spread to other areas of the body. Treatments may be done if needed. What are the causes? Plantar warts are caused by a germ that is called human papillomavirus (HPV). Walking barefoot can cause exposure to the germ, especially if your feet are wet. Warts happen when HPV attacks a break in the skin of the foot. What increases the risk? Being between 10 and 20 years of age. Using public showers or locker rooms. Having a weakened body defense system (immune system). What are the signs or symptoms?  Flat or slightly raised growths that have a rough surface and look like a callus. Pain when you use your foot to support your body weight. How is this treated? In many cases, warts do not need treatment. Without treatment, they often go away with time. If treatment is needed or wanted, options may include: Applying medicated solutions, creams, or patches to the wart. These make the skin soft so that layers will slowly shed away. Freezing the wart with liquid nitrogen (cryotherapy). Burning the wart with: Laser treatment. An electrified probe (electrocautery). Injecting a medicine (Candida antigen) into the wart to help the body's defense system fight off the wart. Having surgery to remove the wart. Putting duct tape over the top of the wart (occlusion). You will leave the tape in place for as long as told by your doctor. Then you will replace it with a new strip of tape. This is done until the wart goes away. Repeat treatment may be needed if you choose to remove warts. Warts sometimes go away and come back again. Follow these instructions at home: General instructions Apply creams or  solutions only as told by your doctor. Follow these steps if your doctor tells you to do so: Soak your foot in warm water. Remove the top layer of softened skin before you apply the medicine. You can use a pumice stone to remove the skin. After you apply the medicine, put a bandage over the area of the wart. Repeat the process every day or as told by your doctor. Do not scratch or pick at a wart. Wash your hands after you touch a wart. If a wart hurts, try covering it with a bandage that has a hole in the middle. Keep all follow-up visits as told by your doctor. This is important. How is this prevented?  Wear shoes and socks. Change your socks every day. Keep your feet clean and dry. Check your feet often. Do not walk barefoot in: Shared locker rooms. Shower areas. Swimming pools. Avoid direct contact with warts on other people. Contact a doctor if: Your warts do not improve after treatment. You have redness, swelling, or pain at the site of a wart. You have bleeding from a wart, and the bleeding does not stop when you put light pressure on the wart. You have diabetes and you get a wart. Summary Warts are small growths on the skin. When warts happen on the bottom of the foot (sole), they are called plantar warts. In many cases, warts do not need treatment. Apply creams or solutions only as told by your doctor. Do not scratch or pick at a wart. Wash   your hands after you touch a wart. This information is not intended to replace advice given to you by your health care provider. Make sure you discuss any questions you have with your health care provider. Document Revised: 08/19/2020 Document Reviewed: 08/19/2020 Elsevier Patient Education  2023 [employer]  

## 2022-08-11 ENCOUNTER — Other Ambulatory Visit: Payer: Self-pay | Admitting: Internal Medicine

## 2022-08-11 LAB — CBC WITH DIFFERENTIAL/PLATELET
Absolute Monocytes: 409 cells/uL (ref 200–950)
Basophils Absolute: 60 cells/uL (ref 0–200)
Basophils Relative: 0.9 %
Eosinophils Absolute: 201 cells/uL (ref 15–500)
Eosinophils Relative: 3 %
HCT: 37.8 % — ABNORMAL LOW (ref 38.5–50.0)
Hemoglobin: 12.4 g/dL — ABNORMAL LOW (ref 13.2–17.1)
Lymphs Abs: 3290 cells/uL (ref 850–3900)
MCH: 30 pg (ref 27.0–33.0)
MCHC: 32.8 g/dL (ref 32.0–36.0)
MCV: 91.3 fL (ref 80.0–100.0)
MPV: 9.8 fL (ref 7.5–12.5)
Monocytes Relative: 6.1 %
Neutro Abs: 2740 cells/uL (ref 1500–7800)
Neutrophils Relative %: 40.9 %
Platelets: 450 10*3/uL — ABNORMAL HIGH (ref 140–400)
RBC: 4.14 10*6/uL — ABNORMAL LOW (ref 4.20–5.80)
RDW: 12.6 % (ref 11.0–15.0)
Total Lymphocyte: 49.1 %
WBC: 6.7 10*3/uL (ref 3.8–10.8)

## 2022-08-11 LAB — COMPLETE METABOLIC PANEL WITH GFR
AG Ratio: 1.1 (calc) (ref 1.0–2.5)
ALT: 25 U/L (ref 9–46)
AST: 22 U/L (ref 10–35)
Albumin: 3.9 g/dL (ref 3.6–5.1)
Alkaline phosphatase (APISO): 62 U/L (ref 35–144)
BUN: 14 mg/dL (ref 7–25)
CO2: 29 mmol/L (ref 20–32)
Calcium: 9.9 mg/dL (ref 8.6–10.3)
Chloride: 101 mmol/L (ref 98–110)
Creat: 0.73 mg/dL (ref 0.70–1.28)
Globulin: 3.7 g/dL (calc) (ref 1.9–3.7)
Glucose, Bld: 211 mg/dL — ABNORMAL HIGH (ref 65–99)
Potassium: 4.3 mmol/L (ref 3.5–5.3)
Sodium: 139 mmol/L (ref 135–146)
Total Bilirubin: 0.3 mg/dL (ref 0.2–1.2)
Total Protein: 7.6 g/dL (ref 6.1–8.1)
eGFR: 98 mL/min/{1.73_m2} (ref >=60)

## 2022-08-11 LAB — HEMOGLOBIN A1C
Hgb A1c MFr Bld: 7.3 % of total Hgb — ABNORMAL HIGH (ref <5.7)
Mean Plasma Glucose: 163 mg/dL
eAG (mmol/L): 9 mmol/L

## 2022-08-11 LAB — LIPID PANEL
Cholesterol: 130 mg/dL (ref <200)
HDL: 38 mg/dL — ABNORMAL LOW (ref >=40)
LDL Cholesterol (Calc): 75 mg/dL (calc)
Non-HDL Cholesterol (Calc): 92 mg/dL (calc) (ref <130)
Total CHOL/HDL Ratio: 3.4 (calc) (ref <5.0)
Triglycerides: 87 mg/dL (ref <150)

## 2022-08-24 ENCOUNTER — Encounter: Payer: Self-pay | Admitting: Podiatry

## 2022-08-24 ENCOUNTER — Ambulatory Visit (HOSPITAL_COMMUNITY)
Admission: EM | Admit: 2022-08-24 | Discharge: 2022-08-24 | Disposition: A | Discharge status: Home / Self Care | Payer: BC Managed Care – PPO | Attending: Internal Medicine | Admitting: Internal Medicine

## 2022-08-24 ENCOUNTER — Ambulatory Visit (INDEPENDENT_AMBULATORY_CARE_PROVIDER_SITE_OTHER): Payer: PPO | Admitting: Podiatry

## 2022-08-24 ENCOUNTER — Encounter (HOSPITAL_COMMUNITY): Payer: Self-pay

## 2022-08-24 DIAGNOSIS — R051 Acute cough: Secondary | ICD-10-CM

## 2022-08-24 DIAGNOSIS — J029 Acute pharyngitis, unspecified: Secondary | ICD-10-CM

## 2022-08-24 DIAGNOSIS — Z1152 Encounter for screening for COVID-19: Secondary | ICD-10-CM | POA: Diagnosis not present

## 2022-08-24 DIAGNOSIS — D2372 Other benign neoplasm of skin of left lower limb, including hip: Secondary | ICD-10-CM | POA: Diagnosis not present

## 2022-08-24 DIAGNOSIS — R21 Rash and other nonspecific skin eruption: Secondary | ICD-10-CM | POA: Diagnosis not present

## 2022-08-24 LAB — POCT RAPID STREP A, ED / UC: Streptococcus, Group A Screen (Direct): NEGATIVE

## 2022-08-24 MED ORDER — CHLORASEPTIC 1.4 % MT LIQD: 1.0000 | OROMUCOSAL | 0 refills | Status: DC | PRN

## 2022-08-24 MED ORDER — BENZONATATE 100 MG PO CAPS: 100.0000 mg | ORAL_CAPSULE | Freq: Three times a day (TID) | ORAL | 0 refills | Status: DC | PRN

## 2022-08-24 MED ORDER — MUPIROCIN 2 % EX OINT: 1.0000 | TOPICAL_OINTMENT | Freq: Two times a day (BID) | CUTANEOUS | 0 refills | Status: DC

## 2022-08-24 NOTE — Discharge Instructions (Signed)
Strep is negative.  Throat culture and COVID test pending.  Suspect you have viral illness causing your symptoms.  I have prescribed 2 medications to alleviate symptoms.  I have prescribed a topical antibiotic cream to apply to rash on your arm.  Monitor for any increased redness, swelling, pus.

## 2022-08-24 NOTE — Progress Notes (Signed)
Subjective:  Patient ID: Wayne [name], male    DOB: 05/05/1951,  MRN: [id number] HPI Chief Complaint  Patient presents with   Toe Pain    Hallux (plantar) left - small, callused area x couple months, shaves down occasionally, but real tender when its thick   New Patient (Initial Visit)    72 y.o. male presents with the above complaint.   ROS: Denies fever chills nausea vomit muscle aches pains calf pain back pain chest pain shortness of breath.  Past Medical History:  Diagnosis Date   Allergy    Anxiety    On Xanax   Chest pain    Fatigue    GERD (gastroesophageal reflux disease)    Heart murmur    hx of MVP    HTN (hypertension)    Hyperlipemia    IBS (irritable bowel syndrome)    Medication management 06/13/2013   Mitral valve prolapse    Obesity (BMI 30.0-34.9) 08/20/2015   OSA (obstructive sleep apnea)    Restated CPAP   Sleep apnea    no cpap    T2_NIDDM    Testosterone deficiency 08/20/2015   Type II or unspecified type diabetes mellitus without mention of complication, not stated as uncontrolled    Vitamin D deficiency    Past Surgical History:  Procedure Laterality Date   COLONOSCOPY     TONSILLECTOMY     WISDOM TOOTH EXTRACTION      Current Outpatient Medications:    Acetaminophen (TYLENOL PO), Take by mouth., Disp: , Rfl:    aspirin 81 MG tablet, Take 81 mg by mouth daily., Disp: , Rfl:    atenolol (TENORMIN) 100 MG tablet, TAKE ONE TABLET BY MOUTH EVERY DAY FOR BLOOD PRESSURE, Disp: 90 tablet, Rfl: 0   benzonatate (TESSALON) 100 MG capsule, Take 1 capsule (100 mg total) by mouth every 8 (eight) hours as needed for cough., Disp: 21 capsule, Rfl: 0   citalopram (CELEXA) 40 MG tablet, TAKE ONE TABLET  DAILY FOR MOOD, Disp: 90 tablet, Rfl: 3   Continuous Blood Gluc Sensor (FREESTYLE LIBRE 14 DAY SENSOR) MISC, USE TO CHECK BLOOD SUGARS THREE TIMES A DAY AS DIRECTED, Disp: 3 each, Rfl: 3   Dulaglutide (TRULICITY) 4.5 MG/0.5ML SOPN, Inject 4.5 mg into the Skin  Once  Weekly   for Diabetes  ( Dx:   e11.29 ), Disp: 6 mL, Rfl: 3   glucose blood (ONETOUCH VERIO) test strip, Check blood sugar 1 time a day-DX-E11.22., Disp: 100 each, Rfl: 4   ibuprofen (ADVIL) 800 MG tablet, Take 800 mg by mouth every 8 (eight) hours as needed., Disp: , Rfl:    losartan-hydrochlorothiazide (HYZAAR) 100-25 MG tablet, TAKE ONE TABLET BY MOUTH ONE TIME DAILY, Disp: 90 tablet, Rfl: 0   Magnesium 400 MG CAPS, Take 1 capsuile Daily (Patient taking differently: 500 mg. Takes 2 capsules Daily), Disp: , Rfl:    metFORMIN (GLUCOPHAGE-XR) 500 MG 24 hr tablet, Take  2 tablets  2 x /day with  Meals for Diabetes                                    /                                    TAKE  BY                      MOUTH, Disp: 360 tablet, Rfl: 3   mupirocin ointment (BACTROBAN) 2 %, Apply 1 Application topically 2 (two) times daily. Apply to arm, Disp: 22 g, Rfl: 0   phenol (CHLORASEPTIC) 1.4 % LIQD, Use as directed 1 spray in the mouth or throat as needed for throat irritation / pain., Disp: 118 mL, Rfl: 0   rosuvastatin (CRESTOR) 20 MG tablet, Take  1 tablet  Daily  for Cholesterol                                         /                       TAKE                                         BY                                             MOUTH                                                    ONE TIME DAILY, Disp: 90 tablet, Rfl: 3   tadalafil (CIALIS) 20 MG tablet, Take 1/2 to 1 tablet every 2 to 3 days as needed for XXXX tadalafil (CIALIS) 20 MG tablet 30 tablet 2 11/20/2021 Sent to pharmacy as: Tadalafil 20 MG Oral Tablet (Cialis) E-Prescribing Status: Receipt confirmed by pharmacy ( [facility])  (11/20/2021 12:04 AM EDT), Disp: 30 tablet, Rfl: 2   VITAMIN D PO, Take 10,000 Units by mouth daily. , Disp: , Rfl:    zinc gluconate 50 MG tablet, Take 50 mg by mouth daily., Disp: , Rfl:   No Known Allergies Review of Systems Objective:  There were no vitals filed for this  visit.  General: Well developed, nourished, in no acute distress, alert and oriented x3   Dermatological: Skin is warm, dry and supple bilateral. Nails x 10 are well maintained; remaining integument appears unremarkable at this time. There are no open sores, no preulcerative lesions, no rash or signs of infection present.  Benign skin lesion plantar aspect of the hallux interphalangeal joint left foot secondary to mild hallux limitus and hallux interphalangeal.  Vascular: Dorsalis Pedis artery and Posterior Tibial artery pedal pulses are 2/4 bilateral with immedate capillary fill time. Pedal hair growth present. No varicosities and no lower extremity edema present bilateral.   Neruologic: Grossly intact via light touch bilateral. Vibratory intact via tuning fork bilateral. Protective threshold with Semmes Wienstein monofilament intact to all pedal sites bilateral. Patellar and Achilles deep tendon reflexes 2+ bilateral. No Babinski or clonus noted bilateral.   Musculoskeletal: No gross boney pedal deformities bilateral. No pain, crepitus, or limitation noted with foot and ankle range of motion bilateral. Muscular strength 5/5 in all groups tested bilateral.  Gait: Unassisted, Nonantalgic.    Radiographs:  None  taken  Assessment & Plan:   Assessment: Benign skin lesion porokeratotic lesion left.  Plan: Debrided benign skin lesion discussed appropriate ways to help decrease the thickness of this lesion.  He will notify us with questions or concerns.       Ysabela Keisler T. Summer [place], [place]

## 2022-08-24 NOTE — ED Triage Notes (Signed)
Patient c/o sore throat and a non productive cough since yesterday.  Patient also rash to the left inner forearm x 2 days.  Patient reports taking Mucinex sore throat lozenges and tablets at at 0800 today.

## 2022-08-24 NOTE — ED Provider Notes (Signed)
[facility]    CSN: [id number] Arrival date & time: 08/24/22  1631      History   Chief Complaint Chief Complaint  Patient presents with   Sore Throat    HPI Wayne [name] is a 72 y.o. male.   Patient presents with 2 different chief complaints today.  Patient reports sore throat and nonproductive cough that started yesterday.  Denies nasal congestion, runny nose, fever, body aches, chills.  Denies any known sick contacts.  Patient has not taken any medications to help alleviate symptoms.  Denies history of asthma or COPD and does not smoke cigarettes.  Also reporting itchy rash to left forearm that started yesterday.  Denies any changes to lotions, soaps, detergents, foods, etc.  Patient has used over-the-counter cream but is not sure the name of it to attempt to help with itchiness which was not beneficial.  Denies any injury to the area.   Sore Throat    Past Medical History:  Diagnosis Date   Allergy    Anxiety    On Xanax   Chest pain    Fatigue    GERD (gastroesophageal reflux disease)    Heart murmur    hx of MVP    HTN (hypertension)    Hyperlipemia    IBS (irritable bowel syndrome)    Medication management 06/13/2013   Mitral valve prolapse    Obesity (BMI 30.0-34.9) 08/20/2015   OSA (obstructive sleep apnea)    Restated CPAP   Sleep apnea    no cpap    T2_NIDDM    Testosterone deficiency 08/20/2015   Type II or unspecified type diabetes mellitus without mention of complication, not stated as uncontrolled    Vitamin D deficiency     Patient Active Problem List   Diagnosis Date Noted   Chronic left-sided low back pain with left-sided sciatica 10/03/2019   CKD stage 2 due to type 2 diabetes mellitus 10/02/2019   CAD (coronary artery disease) 10/02/2019   Aortic atherosclerosis (HCC) by Chest CT scan 06.23.2020 10/02/2019   Lumbar spondylosis 09/17/2018   Spinal stenosis of lumbar region 09/17/2018   Former smoker 03/19/2018   Morbid obesity  (HCC) - BMI 30+ with sleep apnea 08/20/2015   Testosterone deficiency 08/20/2015   Vitamin D deficiency 06/13/2013   Type 2 diabetes mellitus with stage 2 chronic kidney disease, without long-term current use of insulin    OSA (obstructive sleep apnea)    IBS (irritable bowel syndrome)    HTN (hypertension)    Hyperlipidemia associated with type 2 diabetes mellitus    GERD (gastroesophageal reflux disease)    Anxiety     Past Surgical History:  Procedure Laterality Date   COLONOSCOPY     TONSILLECTOMY     WISDOM TOOTH EXTRACTION         Home Medications    Prior to Admission medications   Medication Sig Start Date End Date Taking? Authorizing Provider  benzonatate (TESSALON) 100 MG capsule Take 1 capsule (100 mg total) by mouth every 8 (eight) hours as needed for cough. 08/24/22  Yes Elsia Lasota, Acie FredricksonHaley E, FNP  mupirocin ointment (BACTROBAN) 2 % Apply 1 Application topically 2 (two) times daily. Apply to arm 08/24/22  Yes Ardice Boyan, Fuller AcresHaley E, FNP  phenol (CHLORASEPTIC) 1.4 % LIQD Use as directed 1 spray in the mouth or throat as needed for throat irritation / pain. 08/24/22  Yes [place] Feldpausch, Rolly SalterHaley E, FNP  Acetaminophen (TYLENOL PO) Take by mouth.    [provider]  aspirin 81 MG tablet Take 81 mg by mouth daily.    [provider]  atenolol (TENORMIN) 100 MG tablet TAKE ONE TABLET BY MOUTH EVERY DAY FOR BLOOD PRESSURE 05/24/22   Adela Glimpse, NP  citalopram (CELEXA) 40 MG tablet TAKE ONE TABLET  DAILY FOR MOOD 08/13/22   Lucky Cowboy, MD  Continuous Blood Gluc Sensor (FREESTYLE LIBRE 14 DAY SENSOR) MISC USE TO CHECK BLOOD SUGARS THREE TIMES A DAY AS DIRECTED 03/22/21   Lucky Cowboy, MD  Dulaglutide (TRULICITY) 4.5 MG/0.5ML SOPN Inject 4.5 mg into the Skin Once  Weekly   for Diabetes  ( Dx:   e11.29 ) 05/27/22   Lucky Cowboy, MD  glucose blood (ONETOUCH VERIO) test strip Check blood sugar 1 time a day-DX-E11.22. 03/22/21   Lucky Cowboy, MD  ibuprofen (ADVIL) 800 MG  tablet Take 800 mg by mouth every 8 (eight) hours as needed.    [provider]  losartan-hydrochlorothiazide (HYZAAR) 100-25 MG tablet TAKE ONE TABLET BY MOUTH ONE TIME DAILY 06/28/22   Adela Glimpse, NP  Magnesium 400 MG CAPS Take 1 capsuile Daily Patient taking differently: 500 mg. Takes 2 capsules Daily 01/25/19   Lucky Cowboy, MD  metFORMIN (GLUCOPHAGE-XR) 500 MG 24 hr tablet Take  2 tablets  2 x /day with  Meals for Diabetes                                    /                                    TAKE                              BY                      MOUTH 12/27/21   Lucky Cowboy, MD  rosuvastatin (CRESTOR) 20 MG tablet Take  1 tablet  Daily  for Cholesterol                                         /                       TAKE                                         BY                                             MOUTH                                                    ONE TIME DAILY 04/22/22   Lucky Cowboy, MD  tadalafil (CIALIS) 20 MG tablet Take 1/2 to 1 tablet every 2 to 3 days as  needed for XXXX tadalafil (CIALIS) 20 MG tablet 30 tablet 2 11/20/2021 Sent to pharmacy as: Tadalafil 20 MG Oral Tablet (Cialis) E-Prescribing Status: Receipt confirmed by pharmacy ( [facility])  (11/20/2021 12:04 AM EDT) 11/24/21   Lucky Cowboy, MD  VITAMIN D PO Take 10,000 Units by mouth daily.     [provider]  zinc gluconate 50 MG tablet Take 50 mg by mouth daily.    [provider]    Family History Family History  Problem Relation Age of Onset   Stroke Other    Hypertension Other    Hyperlipidemia Other    Diabetes Other    Colon cancer Father 48   Hypertension Father    Dementia Mother    Alzheimer's disease Mother    Colon polyps Neg Hx    Esophageal cancer Neg Hx    Rectal cancer Neg Hx    Stomach cancer Neg Hx     Social History Social History   Tobacco Use   Smoking status: Former    Packs/day: 1.00    Years: 19.00    Additional pack years: 0.00     Total pack years: 19.00    Types: Cigarettes    Start date: 57    Quit date: 09/06/2007    Years since quitting: 14.9   Smokeless tobacco: Never  Vaping Use   Vaping Use: Never used  Substance Use Topics   Alcohol use: Yes    Comment: occasional   Drug use: No     Allergies   Patient has no known allergies.   Review of Systems Review of Systems Per HPI  Physical Exam Triage Vital Signs ED Triage Vitals  Enc Vitals Group     BP 08/24/22 1742 121/75     Pulse Rate 08/24/22 1742 88     Resp 08/24/22 1742 16     Temp 08/24/22 1742 97.9 F (36.6 C)     Temp Source 08/24/22 1742 Oral     SpO2 08/24/22 1742 93 %     Weight --      Height --      Head Circumference --      Peak Flow --      Pain Score 08/24/22 1743 6     Pain Loc --      Pain Edu? --      Excl. in GC? --    No data found.  Updated Vital Signs BP 121/75 (BP Location: Left Arm)   Pulse 88   Temp 97.9 F (36.6 C) (Oral)   Resp 16   SpO2 93%   Visual Acuity Right Eye Distance:   Left Eye Distance:   Bilateral Distance:    Right Eye Near:   Left Eye Near:    Bilateral Near:     Physical Exam Constitutional:      General: He is not in acute distress.    Appearance: Normal appearance. He is not toxic-appearing or diaphoretic.  HENT:     Head: Normocephalic and atraumatic.     Right Ear: Tympanic membrane and ear canal normal.     Left Ear: Tympanic membrane and ear canal normal.     Nose: Congestion present.     Mouth/Throat:     Mouth: Mucous membranes are moist.     Pharynx: Posterior oropharyngeal erythema present.  Eyes:     Extraocular Movements: Extraocular movements intact.     Conjunctiva/sclera: Conjunctivae normal.     Pupils: Pupils are equal, round, and  reactive to light.  Cardiovascular:     Rate and Rhythm: Normal rate and regular rhythm.     Pulses: Normal pulses.     Heart sounds: Normal heart sounds.  Pulmonary:     Effort: Pulmonary effort is normal. No  respiratory distress.     Breath sounds: Normal breath sounds. No stridor. No wheezing, rhonchi or rales.  Abdominal:     General: Abdomen is flat. Bowel sounds are normal.     Palpations: Abdomen is soft.  Musculoskeletal:        General: Normal range of motion.     Cervical back: Normal range of motion.  Skin:    General: Skin is warm and dry.     Comments: Linear superficial abrasion present to mid left forearm.  Very minimal surrounding erythema but no purulent drainage.   Neurological:     General: No focal deficit present.     Mental Status: He is alert and oriented to person, place, and time. Mental status is at baseline.  Psychiatric:        Mood and Affect: Mood normal.        Behavior: Behavior normal.        Thought Content: Thought content normal.        Judgment: Judgment normal.      UC Treatments / Results  Labs (all labs ordered are listed, but only abnormal results are displayed) Labs Reviewed  CULTURE, GROUP A STREP ([facility])  SARS CORONAVIRUS 2 (TAT 6-24 HRS)  POCT RAPID STREP A, ED / UC    EKG   Radiology No results found.  Procedures Procedures (including critical care time)  Medications Ordered in UC Medications - No data to display  Initial Impression / Assessment and Plan / UC Course  I have reviewed the triage vital signs and the nursing notes.  Pertinent labs & imaging results that were available during my care of the patient were reviewed by me and considered in my medical decision making (see chart for details).     Viral illness  Rapid strep was negative. Throat culture and covid test pending. Suspect viral illness versus allergic related symptoms. Patient advised of supportive care and symptom management. Tessalon perles and throat spray prescribed. There are no adventitious lung sounds on exam or signs of respiratory compromise so do not think that chest imaging is necessary. Oxygen is 94 but this appears baseline for patient after  further review of the chart. Advised follow up if symptoms persist or worsen.   2. Rash  Rash appears to be an abrasion. No signs of infection. Will treat with mupirocin ointment. Patient thinks it may be a dog scratch but denies any obvious injury. Tetanus vaccine uptodate per patient report. Advised monitoring for signs of infection and following up if they are occur. Patient verbalized understanding and was agreeable.  Final Clinical Impressions(s) / UC Diagnoses   Final diagnoses:  Sore throat  Acute cough  Rash and nonspecific skin eruption     Discharge Instructions      Strep is negative.  Throat culture and COVID test pending.  Suspect you have viral illness causing your symptoms.  I have prescribed 2 medications to alleviate symptoms.  I have prescribed a topical antibiotic cream to apply to rash on your arm.  Monitor for any increased redness, swelling, pus.     ED Prescriptions     Medication Sig Dispense Auth. Provider   mupirocin ointment (BACTROBAN) 2 % Apply 1 Application topically  2 (two) times daily. Apply to arm 22 g [place], Rolly Salter E, [place]   phenol (CHLORASEPTIC) 1.4 % LIQD Use as directed 1 spray in the mouth or throat as needed for throat irritation / pain. 118 mL Lokelani Lutes, [place] E, [place]   benzonatate (TESSALON) 100 MG capsule Take 1 capsule (100 mg total) by mouth every 8 (eight) hours as needed for cough. 21 capsule [place], Acie Fredrickson, [place]      PDMP not reviewed this encounter.   Gustavus Bryant, [place] 08/24/22 [phone]

## 2022-08-25 LAB — SARS CORONAVIRUS 2 (TAT 6-24 HRS): SARS Coronavirus 2: NEGATIVE

## 2022-08-27 LAB — CULTURE, GROUP A STREP (THRC)

## 2022-09-26 DIAGNOSIS — M48062 Spinal stenosis, lumbar region with neurogenic claudication: Secondary | ICD-10-CM | POA: Diagnosis not present

## 2022-10-03 ENCOUNTER — Ambulatory Visit: Payer: BC Managed Care – PPO | Admitting: Nurse Practitioner

## 2022-10-31 ENCOUNTER — Other Ambulatory Visit: Payer: Self-pay | Admitting: Internal Medicine

## 2022-11-03 ENCOUNTER — Other Ambulatory Visit: Payer: Self-pay | Admitting: Internal Medicine

## 2022-11-03 ENCOUNTER — Other Ambulatory Visit: Payer: Self-pay | Admitting: Nurse Practitioner

## 2022-11-03 DIAGNOSIS — I10 Essential (primary) hypertension: Secondary | ICD-10-CM

## 2022-11-03 MED ORDER — LOSARTAN POTASSIUM-HCTZ 100-25 MG PO TABS: ORAL_TABLET | ORAL | 3 refills | Status: DC

## 2022-11-09 ENCOUNTER — Encounter: Payer: Self-pay | Admitting: Internal Medicine

## 2022-11-09 ENCOUNTER — Ambulatory Visit (INDEPENDENT_AMBULATORY_CARE_PROVIDER_SITE_OTHER): Payer: PPO | Admitting: Internal Medicine

## 2022-11-09 VITALS — BP 118/70 | HR 77 | Temp 97.9°F | Resp 16 | Ht 72.0 in | Wt 216.4 lb

## 2022-11-09 DIAGNOSIS — I700 Atherosclerosis of aorta: Secondary | ICD-10-CM

## 2022-11-09 DIAGNOSIS — E1122 Type 2 diabetes mellitus with diabetic chronic kidney disease: Secondary | ICD-10-CM | POA: Diagnosis not present

## 2022-11-09 DIAGNOSIS — N138 Other obstructive and reflux uropathy: Secondary | ICD-10-CM | POA: Diagnosis not present

## 2022-11-09 DIAGNOSIS — I2510 Atherosclerotic heart disease of native coronary artery without angina pectoris: Secondary | ICD-10-CM

## 2022-11-09 DIAGNOSIS — Z136 Encounter for screening for cardiovascular disorders: Secondary | ICD-10-CM

## 2022-11-09 DIAGNOSIS — I10 Essential (primary) hypertension: Secondary | ICD-10-CM | POA: Diagnosis not present

## 2022-11-09 DIAGNOSIS — Z0001 Encounter for general adult medical examination with abnormal findings: Secondary | ICD-10-CM

## 2022-11-09 DIAGNOSIS — Z0000 Encounter for general adult medical examination without abnormal findings: Secondary | ICD-10-CM

## 2022-11-09 DIAGNOSIS — F419 Anxiety disorder, unspecified: Secondary | ICD-10-CM

## 2022-11-09 DIAGNOSIS — N182 Chronic kidney disease, stage 2 (mild): Secondary | ICD-10-CM | POA: Diagnosis not present

## 2022-11-09 DIAGNOSIS — E559 Vitamin D deficiency, unspecified: Secondary | ICD-10-CM

## 2022-11-09 DIAGNOSIS — Z125 Encounter for screening for malignant neoplasm of prostate: Secondary | ICD-10-CM

## 2022-11-09 DIAGNOSIS — E1169 Type 2 diabetes mellitus with other specified complication: Secondary | ICD-10-CM

## 2022-11-09 DIAGNOSIS — Z79899 Other long term (current) drug therapy: Secondary | ICD-10-CM

## 2022-11-09 DIAGNOSIS — N401 Enlarged prostate with lower urinary tract symptoms: Secondary | ICD-10-CM

## 2022-11-09 DIAGNOSIS — E785 Hyperlipidemia, unspecified: Secondary | ICD-10-CM | POA: Diagnosis not present

## 2022-11-09 DIAGNOSIS — Z8249 Family history of ischemic heart disease and other diseases of the circulatory system: Secondary | ICD-10-CM

## 2022-11-09 DIAGNOSIS — Z87891 Personal history of nicotine dependence: Secondary | ICD-10-CM

## 2022-11-09 DIAGNOSIS — Z1211 Encounter for screening for malignant neoplasm of colon: Secondary | ICD-10-CM

## 2022-11-09 MED ORDER — CITALOPRAM HYDROBROMIDE 40 MG PO TABS: ORAL_TABLET | ORAL | 3 refills | Status: DC

## 2022-11-09 MED ORDER — ATENOLOL 100 MG PO TABS: ORAL_TABLET | ORAL | 0 refills | Status: DC

## 2022-11-09 MED ORDER — LOSARTAN POTASSIUM-HCTZ 100-25 MG PO TABS: ORAL_TABLET | ORAL | 3 refills | Status: DC

## 2022-11-09 MED ORDER — TRULICITY 4.5 MG/0.5ML ~~LOC~~ SOAJ: SUBCUTANEOUS | 3 refills | Status: DC

## 2022-11-09 MED ORDER — ROSUVASTATIN CALCIUM 20 MG PO TABS: ORAL_TABLET | ORAL | 3 refills | Status: DC

## 2022-11-09 NOTE — Patient Instructions (Signed)

## 2022-11-09 NOTE — Progress Notes (Signed)
Annual  Screening/Preventative Visit  & Comprehensive Evaluation & Examination  Future Appointments  Date Time Provider Department  11/09/2022                            cpe  2:00 PM Lucky Cowboy, MD [facility]  08/10/2023                          wellness  11:00 AM Raynelle Dick, NP [facility]  11/19/2023                             cpe  2:00 PM Lucky Cowboy, MD [facility]              This very nice 72 y.o. MBM  presents for a Screening /Preventative Visit & comprehensive evaluation and management of multiple medical co-morbidities.  Patient has been followed for HTN, HLD, T2_NIDDM  and Vitamin D Deficiency. Chest CT scan in June 2020 showed Aortic Atherosclerosis.        HTN predates since  1989.  Patient's BP has been controlled at home.  Today's BP is at goal - 118/70 . Patient denies any cardiac symptoms as chest pain, palpitations, shortness of breath, dizziness or ankle swelling.       Patient's hyperlipidemia is controlled with diet and g Crestor Patient denies myalgias or other medication SE's. Last lipids were at goal :  Lab Results  Component Value Date   CHOL 128 01/26/2021   HDL 36 (L) 01/26/2021   LDLCALC 71 01/26/2021   TRIG 119 01/26/2021   CHOL /HDL 3.6 01/26/2021        Patient has moderate obesity (BMI 31+)  and consequent T2_NIDDM (2007)  w/CKD1. Patient has ben on Metformin & 2 months ago , started on [facility] &  has already lost 13 # . Patient denies reactive hypoglycemic symptoms, visual blurring, diabetic polys or paresthesias. Last A1c was not at goal :   Lab Results  Component Value Date   HGBA1C 10.0 (H) 01/26/2021                                                  Patient has prior  hx/o Low Testosterone ("209" /2015 and "154" /2016) and he declined treatment.       Finally, patient has history of Vitamin D Deficiency ("22" /2008 and "34" /2019)    and last vitamin D was at goal :   Lab Results  Component Value Date   VD25OH 94  01/26/2021     Current Outpatient Medications on File Prior to Visit  Medication Sig   aspirin 81 MG tablet Take  daily.   atenolol (TENORMIN) 100 MG tablet TAKE ONE TABLET DAILY    citalopram (CELEXA) 40 MG tablet Take  1 tablet  Daily    FLONASE  nasal spray Place 1 spray into both nostrils in the morning and at bedtime.    losartan-hctz 100-25 MG tablet Take  1 tablet  Daily     Magnesium 400 MG CAPS Takes 2 capsules Daily   metFORMIN -XR 500 MG  TAKE TWO TABLETS  TWICE A DAY    montelukast (SINGULAIR) 10 MG tablet Take 1 tablet Daily for Allergies  rosuvastatin (CRESTOR) 20 MG tablet Take  1 tablet  Daily  for Cholesterol     tirzepatide (MOUNJARO) 2.5 MG/0.5ML Pen Inject 2.5 mg into the skin once a week.   VITAMIN D PO Take 10,000 Units daily.    zinc 50 MG tablet Take daily.    No Known Allergies   Past Medical History:  Diagnosis Date   Allergy    Anxiety    On Xanax   Chest pain    Fatigue    GERD (gastroesophageal reflux disease)    Heart murmur    hx of MVP    HTN (hypertension)    Hyperlipemia    IBS (irritable bowel syndrome)    Medication management 06/13/2013   Mitral valve prolapse    Obesity (BMI 30.0-34.9) 08/20/2015   OSA (obstructive sleep apnea)    Restated CPAP   Sleep apnea    no cpap    T2_NIDDM    Testosterone deficiency 08/20/2015   Type II  type diabetes mellitus     Vitamin D deficiency      Health Maintenance  Topic Date Due   Zoster Vaccines- Shingrix (1 of 2) Never done   FOOT EXAM  04/22/2020   COVID-19 Vaccine (4 - Booster for Moderna series) 06/28/2020   HEMOGLOBIN A1C  07/26/2021   OPHTHALMOLOGY EXAM  08/10/2021   COLONOSCOPY (Pts 45-13yrs Insurance coverage will need to be confirmed)  03/30/2022   TETANUS/TDAP  01/31/2024   Pneumonia Vaccine 44+ Years old  Completed   INFLUENZA VACCINE  Completed   Hepatitis C Screening  Completed   HPV VACCINES  Aged Out     Immunization History  Administered Date(s) Administered    Influenza Inj Mdck Quad  01/29/2017   Influenza Split 03/01/2015   Influenza, High Dose  03/19/2018, 04/23/2019, 03/08/2020, 03/15/2021   Influenza,inj,quad 03/03/2016   Moderna Sars-Covid-2 Vacc 06/15/2019, 07/13/2019, 05/03/2020   PPD Test 01/30/2014, 01/29/2017, 03/19/2018, 07/28/2020   Pneumococcal -13 03/19/2018   Pneumococcal -23 05/21/2008, 04/23/2019   Td 10/30/2003   Tdap 01/30/2014    Last Colon -  03/30/2017 - Dr Christella Hartigan >(-) Colonoscopy /(+) FHx Colon Ca Father /Recc 5 yr f./u Colon in Nov 2023  Past Surgical History:  Procedure Laterality Date   COLONOSCOPY     TONSILLECTOMY     WISDOM TOOTH EXTRACTION       Family History  Problem Relation Age of Onset   Stroke Other    Hypertension Other    Hyperlipidemia Other    Diabetes Other    Colon cancer Father 19   Hypertension Father    Dementia Mother    Alzheimer's disease Mother    Colon polyps Neg Hx    Esophageal cancer Neg Hx    Rectal cancer Neg Hx    Stomach cancer Neg Hx      Social History   Tobacco Use   Smoking status: Former    Packs/day: 1.00    Years: 19.00    Pack years: 19.00    Types: Cigarettes    Start date: 71    Quit date: 09/06/2007    Years since quitting: 13.7   Smokeless tobacco: Never  Vaping Use   Vaping Use: Never used  Substance Use Topics   Alcohol use: Yes    Comment: occasional   Drug use: No      ROS Constitutional: Denies fever, chills, weight loss/gain, headaches, insomnia,  night sweats or change in appetite. Does c/o fatigue. Eyes: Denies redness,  blurred vision, diplopia, discharge, itchy or watery eyes.  ENT: Denies discharge, congestion, post nasal drip, epistaxis, sore throat, earache, hearing loss, dental pain, Tinnitus, Vertigo, Sinus pain or snoring.  Cardio: Denies chest pain, palpitations, irregular heartbeat, syncope, dyspnea, diaphoresis, orthopnea, PND, claudication or edema Respiratory: denies cough, dyspnea, DOE, pleurisy, hoarseness,  laryngitis or wheezing.  Gastrointestinal: Denies dysphagia, heartburn, reflux, water brash, pain, cramps, nausea, vomiting, bloating, diarrhea, constipation, hematemesis, melena, hematochezia, jaundice or hemorrhoids Genitourinary: Denies dysuria, frequency, discharge, hematuria or flank pain. Has urgency, nocturia x 2-3 & occasional hesitancy. Musculoskeletal: Denies arthralgia, myalgia, stiffness, Jt. Swelling, pain, limp or strain/sprain. Denies Falls. Skin: Denies puritis, rash, hives, warts, acne, eczema or change in skin lesion Neuro: No weakness, tremor, incoordination, spasms, paresthesia or pain Psychiatric: Denies confusion, memory loss or sensory loss. Denies Depression. Endocrine: Denies change in weight, skin, hair change, nocturia, and paresthesia, diabetic polys, visual blurring or hyper / hypo glycemic episodes.  Heme/Lymph: No excessive bleeding, bruising or enlarged lymph nodes.   Physical Exam  BP 118/70   Pulse 77   Temp 97.9 F (36.6 C)   Resp 16   Ht 6' (1.829 m)   Wt 216 lb 6.4 oz (98.2 kg)   SpO2 98%   BMI 29.35 kg/m   General Appearance: Well nourished and well groomed and in no apparent distress.  Eyes: PERRLA, EOMs, conjunctiva no swelling or erythema, normal fundi and vessels. Sinuses: No frontal/maxillary tenderness ENT/Mouth: EACs patent / TMs  nl. Nares clear without erythema, swelling, mucoid exudates. Oral hygiene is good. No erythema, swelling, or exudate. Tongue normal, non-obstructing. Tonsils not swollen or erythematous. Hearing normal.  Neck: Supple, thyroid not palpable. No bruits, nodes or JVD. Respiratory: Respiratory effort normal.  BS equal and clear bilateral without rales, rhonci, wheezing or stridor. Cardio: Heart sounds are normal with regular rate and rhythm and no murmurs, rubs or gallops. Peripheral pulses are normal and equal bilaterally without edema. No aortic or femoral bruits. Chest: symmetric with normal excursions and  percussion.  Abdomen: Soft, with Nl bowel sounds. Nontender, no guarding, rebound, hernias, masses, or organomegaly.  Lymphatics: Non tender without lymphadenopathy.  Musculoskeletal: Full ROM all peripheral extremities, joint stability, 5/5 strength, and normal gait. Skin: Warm and dry without rashes, lesions, cyanosis, clubbing or  ecchymosis.  Neuro: Cranial nerves intact, reflexes equal bilaterally. Normal muscle tone, no cerebellar symptoms. Sensation intact.  Pysch: Alert and oriented X 3 with normal affect, insight and judgment appropriate.   Assessment and Plan  1. Annual Preventative/Screening Exam    2. Essential hypertension  - EKG 12-Lead - [place], RETROPERITNL ABD,  LTD - Urinalysis, Routine w reflex microscopic - Microalbumin / creatinine urine ratio - CBC with Differential/Platelet - COMPLETE METABOLIC PANEL WITH GFR - Magnesium - TSH   3. Hyperlipidemia associated with type 2 diabetes mellitus (HCC)  - EKG 12-Lead - [place], RETROPERITNL ABD,  LTD - Lipid panel - TSH   4. Type 2 diabetes mellitus with stage 1 chronic kidney                               disease, without long-term current use of insulin (HCC)  - EKG 12-Lead - [place], RETROPERITNL ABD,  LTD - HM DIABETES FOOT EXAM - PR LOW EXTEMITY NEUR EXAM DOCUM - Hemoglobin A1c - Insulin, random   5. Vitamin D deficiency  - Urinalysis, Routine w reflex microscopic - Microalbumin / creatinine urine ratio - VITAMIN  D 25 Hydroxy    6. Aortic atherosclerosis (HCC) by Chest CT scan 06.23.2020  - EKG 12-Lead - [place], RETROPERITNL ABD,  LTD - Lipid panel   7. BPH with obstruction/lower urinary tract symptoms  - PSA  8. Prostate cancer screening  - PSA   9. Screening for ischemic heart disease  - EKG 12-Lead   10. Former smoker  - EKG 12-Lead - [place], RETROPERITNL ABD,  LTD   11. FH: hypertension  - EKG 12-Lead - [place], RETROPERITNL ABD,  LTD   12. Screening for AAA (aortic abdominal  aneurysm)  - [place], RETROPERITNL ABD,  LTD   13. Medication management  - Urinalysis, Routine w reflex microscopic - Microalbumin / creatinine urine ratio - CBC with Differential/Platelet - COMPLETE METABOLIC PANEL WITH GFR - Magnesium - Lipid panel - TSH - Hemoglobin A1c - Insulin, random - VITAMIN D 25 Hydroxy             Patient was counseled in prudent diet, weight control to achieve/maintain BMI less than 25, BP monitoring, regular exercise and medications as discussed.  Discussed med effects and SE's. Routine screening labs and tests as requested with regular follow-up as recommended. Over 40 minutes of exam, counseling, chart review and high complex critical decision making was performed   Marinus Maw, MD

## 2022-11-10 LAB — COMPLETE METABOLIC PANEL WITH GFR
AG Ratio: 1.4 (calc) (ref 1.0–2.5)
ALT: 15 U/L (ref 9–46)
AST: 19 U/L (ref 10–35)
Albumin: 4.5 g/dL (ref 3.6–5.1)
Alkaline phosphatase (APISO): 68 U/L (ref 35–144)
BUN: 18 mg/dL (ref 7–25)
CO2: 30 mmol/L (ref 20–32)
Calcium: 10.5 mg/dL — ABNORMAL HIGH (ref 8.6–10.3)
Chloride: 99 mmol/L (ref 98–110)
Creat: 0.77 mg/dL (ref 0.70–1.28)
Globulin: 3.2 g/dL (calc) (ref 1.9–3.7)
Glucose, Bld: 84 mg/dL (ref 65–99)
Potassium: 4.4 mmol/L (ref 3.5–5.3)
Sodium: 140 mmol/L (ref 135–146)
Total Bilirubin: 0.5 mg/dL (ref 0.2–1.2)
Total Protein: 7.7 g/dL (ref 6.1–8.1)
eGFR: 96 mL/min/{1.73_m2} (ref >=60)

## 2022-11-10 LAB — CBC WITH DIFFERENTIAL/PLATELET
Absolute Monocytes: 429 cells/uL (ref 200–950)
Basophils Absolute: 67 cells/uL (ref 0–200)
Basophils Relative: 1 %
Eosinophils Absolute: 268 cells/uL (ref 15–500)
Eosinophils Relative: 4 %
HCT: 38.9 % (ref 38.5–50.0)
Hemoglobin: 13.1 g/dL — ABNORMAL LOW (ref 13.2–17.1)
Lymphs Abs: 3812 cells/uL (ref 850–3900)
MCH: 30.5 pg (ref 27.0–33.0)
MCHC: 33.7 g/dL (ref 32.0–36.0)
MCV: 90.5 fL (ref 80.0–100.0)
MPV: 10.5 fL (ref 7.5–12.5)
Monocytes Relative: 6.4 %
Neutro Abs: 2124 cells/uL (ref 1500–7800)
Neutrophils Relative %: 31.7 %
Platelets: 283 10*3/uL (ref 140–400)
RBC: 4.3 10*6/uL (ref 4.20–5.80)
RDW: 13 % (ref 11.0–15.0)
Total Lymphocyte: 56.9 %
WBC: 6.7 10*3/uL (ref 3.8–10.8)

## 2022-11-10 LAB — LIPID PANEL
Cholesterol: 129 mg/dL (ref <200)
HDL: 43 mg/dL (ref >=40)
LDL Cholesterol (Calc): 68 mg/dL (calc)
Non-HDL Cholesterol (Calc): 86 mg/dL (calc) (ref <130)
Total CHOL/HDL Ratio: 3 (calc) (ref <5.0)
Triglycerides: 99 mg/dL (ref <150)

## 2022-11-10 LAB — HEMOGLOBIN A1C
Hgb A1c MFr Bld: 7.1 % of total Hgb — ABNORMAL HIGH (ref <5.7)
Mean Plasma Glucose: 157 mg/dL
eAG (mmol/L): 8.7 mmol/L

## 2022-11-10 LAB — MICROALBUMIN / CREATININE URINE RATIO
Creatinine, Urine: 103 mg/dL (ref 20–320)
Microalb Creat Ratio: 3 mg/g creat (ref <30)
Microalb, Ur: 0.3 mg/dL

## 2022-11-10 LAB — URINALYSIS, ROUTINE W REFLEX MICROSCOPIC
Bilirubin Urine: NEGATIVE
Glucose, UA: NEGATIVE
Hgb urine dipstick: NEGATIVE
Ketones, ur: NEGATIVE
Leukocytes,Ua: NEGATIVE
Nitrite: NEGATIVE
Protein, ur: NEGATIVE
Specific Gravity, Urine: 1.021 (ref 1.001–1.035)
pH: 6.5 (ref 5.0–8.0)

## 2022-11-10 LAB — INSULIN, RANDOM: Insulin: 18.1 u[IU]/mL

## 2022-11-10 LAB — PSA: PSA: 1.25 ng/mL (ref <=4.00)

## 2022-11-10 LAB — VITAMIN D 25 HYDROXY (VIT D DEFICIENCY, FRACTURES): Vit D, 25-Hydroxy: 98 ng/mL (ref 30–100)

## 2022-11-10 LAB — TSH: TSH: 2.48 mIU/L (ref 0.40–4.50)

## 2022-11-10 LAB — MAGNESIUM: Magnesium: 1.8 mg/dL (ref 1.5–2.5)

## 2022-11-10 NOTE — Progress Notes (Signed)
^<^<^<^<^<^<^<^<^<^<^<^<^<^<^<^<^<^<^<^<^<^<^<^<^<^<^<^<^<^<^<^<^<^<^<^<^ ^>^>^>^>^>^>^>^>^>^>^>>^>^>^>^>^>^>^>^>^>^>^>^>^>^>^>^>^>^>^>^>^>^>^>^>^>  -Test results slightly outside the reference range are not unusual. If there is anything important, I will review this with you,  otherwise it is considered normal test values.  If you have further questions,  please do not hesitate to contact me at the office or via My Chart.   ^<^<^<^<^<^<^<^<^<^<^<^<^<^<^<^<^<^<^<^<^<^<^<^<^<^<^<^<^<^<^<^<^<^<^<^<^ ^>^>^>^>^>^>^>^>^>^>^>^>^>^>^>^>^>^>^>^>^>^>^>^>^>^>^>^>^>^>^>^>^>^>^>^>^  - A1c = 7.1% - Still too high    -  Being diabetic has a  300% increased risk for heart attack,                                                     stroke, cancer, and alzheimer- type vascular dementia.   It is very important that you work harder with diet by                                     avoiding all foods that are white except chicken, fish & calliflower.  - Avoid white rice  (brown & wild rice is OK),   - Avoid white potatoes  (sweet potatoes in moderation is OK),   White bread or wheat bread or anything made out of   white flour like bagels, donuts, rolls, buns, biscuits, cakes,  - pastries, cookies, pizza crust, and pasta (made from  white flour & egg whites)   - vegetarian pasta or spinach or wheat pasta is OK.  - Multigrain breads like Arnold's, Pepperidge Farm or   multigrain sandwich thins or high fiber breads like   [place] bread or "Dave's Killer" breads that are  4 to 5 grams fiber per slice !  are best.    Diet, exercise and weight loss can reverse and cure  diabetes in the early stages.    - Diet, exercise and weight loss is very important in the   control and prevention of complications of diabetes which                                                                           affects every system in your body, ie.   -Brain - dementia/stroke,  - eyes - glaucoma/blindness,  -  heart - heart attack/heart failure,  - kidneys - dialysis,  - stomach - gastric paralysis,  - intestines - malabsorption,  - nerves - severe painful neuritis,  - circulation - gangrene & loss of a leg(s)  - and finally  . . . . . . . . . . . . . . . . . .    - cancer and Alzheimers. ^>^>^>^>^>^>^>^>^>^>^>^>^>^>^>^>^>^>^>^>^>^>^>^>^>^>^>^>^>^>^>^>^>^>^>^>^  - PSA - Low - No Prostate Cancer  - Great  !  ^>^>^>^>^>^>^>^>^>^>^>^>^>^>^>^>^>^>^>^>^>^>^>^>^>^>^>^>^>^>^>^>^>^>^>^>^  -  Magnesium  -     -  very  low- goal is betw 2.0 - 2.5,   - So..............Marland Kitchen  Recommend that you take  Magnesium 500 mg tablet 3 x/ day with Meals   - also important to eat lots of  leafy green vegetables   - spinach - Kale - collards - greens - okra - asparagus - broccoli - quinoa - squash   - almonds - black, red, white beans -  peas - green beans ^>^>^>^>^>^>^>^>^>^>^>^>^>^>^>^>^>^>^>^>^>^>^>^>^>^>^>^>^>^>^>^>^>^>^>^>^  - Chol = 129   &   LDL Chol = 68  - Both    Excellent  !   - Very low risk for Heart Attack  / Stroke  ^>^>^>^>^>^>^>^>^>^>^>^>^>^>^>^>^>^>^>^>^>^>^>^>^>^>^>^>^>^>^>^>^>^>^>^>^ ^>^>^>^>^>^>^>^>^>^>^>^>^>^>^>^>^>^>^>^>^>^>^>^>^>^>^>^>^>^>^>^>^>^>^>^>^  -  Vitamin D = 98 - Excellent  - Please continue Dosage same   ^>^>^>^>^>^>^>^>^>^>^>^>^>^>^>^>^>^>^>^>^>^>^>^>^>^>^>^>^>^>^>^>^>^>^>^>^  - All Else - CBC - Kidneys - Electrolytes - Liver - Magnesium & Thyroid    - all  Normal / OK ^>^>^>^>^>^>^>^>^>^>^>^>^>^>^>^>^>^>^>^>^>^>^>^>^>^>^>^>^>^>^>^>^>^>^>^>^ ^>^>^>^>^>^>^>^>^>^>^>^>^>^>^>^>^>^>^>^>^>^>^>^>^>^>^>^>^>^>^>^>^>^>^>^>^

## 2022-11-11 ENCOUNTER — Encounter: Payer: Self-pay | Admitting: Internal Medicine

## 2023-01-07 ENCOUNTER — Other Ambulatory Visit: Payer: Self-pay | Admitting: Internal Medicine

## 2023-01-07 DIAGNOSIS — E119 Type 2 diabetes mellitus without complications: Secondary | ICD-10-CM

## 2023-02-14 NOTE — Progress Notes (Deleted)
FOLLOW UP  Assessment and Plan:   Atherosclerosis of aorta (HCC)  -per CT 10/2018 Control blood pressure, cholesterol, glucose, increase exercise.   CAD Control blood pressure, cholesterol, glucose, increase exercise.  Est with Dr. Dulce Sellar  Hypertension Well controlled with current medications; on ARB Monitor blood pressure at home; patient to call if consistently greater than 130/80 Continue DASH diet.   Reminder to go to the ER if any CP, SOB, nausea, dizziness, severe HA, changes vision/speech, left arm numbness and tingling and jaw pain.  Hyperlipidemia with Type 2 Diabetes Mellitus(HCC) Continue statin medication for LDL goal <70 Continue low cholesterol diet and exercise.  Check lipid panel.   Diabetes with diabetic chronic kidney disease Continue medication: metformin, tulicity RESTART CHECKING SUGARS Continue diet and exercise.  Perform daily foot/skin check, notify office of any concerning changes.  Check A1C  Anxiety Well managed by current regimen;  Stress management techniques discussed, increase water, good sleep hygiene discussed, increase exercise, and increase veggies.   Morbid obesity - BMI 30 + with OSA, T2DM Long discussion about weight loss, diet, and exercise Discussed ideal weight for height and initial weight goal Patient will work on portion sizes and gentle exercise Will follow up in 3 months  Vitamin D Def/ osteoporosis prevention Continue supplementation; advised to reduce from 10000 IU to 7000-8000 IU, 5000 IU was too low in the past Defer vitamin D  OSA Discussed risks of complications with CPAP non-compliance including cardiac arrhythmias; he requests referral back to Dr. Vickey Huger  GERD Initiated medication for new reflux related symptoms: famotidine 20 mg BID Discussed diet, avoiding triggers and other lifestyle changes   Continue diet and meds as discussed. Further disposition pending results of labs. Discussed med's effects and SE's.    Over 30 minutes of exam, counseling, chart review, and critical decision making was performed.   Future Appointments  Date Time Provider Department Center  02/15/2023  4:00 PM Raynelle Dick, NP [facility] None  05/24/2023  4:00 PM Lucky Cowboy, MD [facility] None  08/23/2023  4:00 PM Raynelle Dick, NP [facility] None  11/22/2023  3:00 PM Lucky Cowboy, MD [facility] None    ----------------------------------------------------------------------------------------------------------------------  HPI 72 y.o. male  presents for 3 month follow up on hypertension, cholesterol, T2 diabetes, CKD, vitamin D def, CAD, anxiety.  he has a diagnosis of anxiety and is currently prescribed celexa 40 mg daily, reports symptoms are well controlled on current regimen.   He is on CPAP for mild/mod OSA per sleep study 10/17/2018 by Dr. Vickey Huger, reports never got machine fully adjusted, hasn't used in over 1 year.   BMI is There is no height or weight on file to calculate BMI., he has been working on diet and exercises, trying to use stairs more, less exertional dyspnea.  Wt Readings from Last 3 Encounters:  11/09/22 216 lb 6.4 oz (98.2 kg)  08/10/22 217 lb 9.6 oz (98.7 kg)  04/03/22 213 lb 12.8 oz (97 kg)   he has a diagnosis of GERD which is currently managed by protonix 40 mg daily after poor control with H2i    He has aortic atherosclerosis per CT 10/2018. Coronary calcium score 301, then had CTA with about 50 % at LAD per Dr. Dulce Sellar.   His blood pressure has been controlled at home (110-120s/70-80s), today their BP is    He does workout. He denies chest pain, shortness of breath. He has had some episodes of dizziness with position changes, admits typically on days working out  of the office and not hydrating well    He is on cholesterol medication (rosuvasatin 20 mg daily) and denies myalgias. His cholesterol is at goal. The cholesterol last visit was:   Lab Results  Component Value Date    CHOL 129 11/09/2022   HDL 43 11/09/2022   LDLCALC 68 11/09/2022   TRIG 99 11/09/2022   CHOLHDL 3.0 11/09/2022    He has been working on diet and exercise for T2 diabetes (on metformin 2000 mg daily, trulicity 4.5 mg/week), and denies increased appetite, nausea, paresthesia of the feet, polydipsia, polyuria, visual disturbances and vomiting.  Last A1C in the office was:  Lab Results  Component Value Date   HGBA1C 7.1 (H) 11/09/2022   Lab Results  Component Value Date   EGFR 96 11/09/2022   Patient is on Vitamin D supplement but remained below goal at the last check, hasn't changed dose, still taking 16109 IU daily, suggested he reduce to 7000-8000 IU daily  Lab Results  Component Value Date   VD25OH 98 11/09/2022       Current Medications:  Current Outpatient Medications on File Prior to Visit  Medication Sig   Acetaminophen (TYLENOL PO) Take by mouth.   aspirin 81 MG tablet Take 81 mg by mouth daily.   atenolol (TENORMIN) 100 MG tablet TAKE ONE TABLET BY MOUTH EVERY DAY FOR BLOOD PRESSURE   citalopram (CELEXA) 40 MG tablet TAKE ONE TABLET  DAILY FOR MOOD   Continuous Blood Gluc Sensor (FREESTYLE LIBRE 14 DAY SENSOR) MISC USE TO CHECK BLOOD SUGARS THREE TIMES A DAY AS DIRECTED   Dulaglutide (TRULICITY) 4.5 MG/0.5ML SOPN Inject  1 pen (4.5 mg)  into skin  every 7 days  for Diabetes  ( e11.29)   glucose blood (ONETOUCH VERIO) test strip Check blood sugar 1 time a day-DX-E11.22.   ibuprofen (ADVIL) 800 MG tablet Take 800 mg by mouth every 8 (eight) hours as needed.   losartan-hydrochlorothiazide (HYZAAR) 100-25 MG tablet Take  1 tablet Daily for BP                                                                                                   /                                                                   TAKE                                         BY  MOUTH                ONE ? ? ? TIME ? ? ? DAILY   Magnesium 400 MG CAPS Take 1  capsuile Daily (Patient taking differently: 500 mg. Takes 2 capsules Daily)   metFORMIN (GLUCOPHAGE-XR) 500 MG 24 hr tablet Take  2 tablets  2 x / day with Meals for Diabetes                                                                 /                                                                   TAKE                                         BY                                                 MOUTH   mupirocin ointment (BACTROBAN) 2 % Apply 1 Application topically 2 (two) times daily. Apply to arm   phenol (CHLORASEPTIC) 1.4 % LIQD Use as directed 1 spray in the mouth or throat as needed for throat irritation / pain.   rosuvastatin (CRESTOR) 20 MG tablet Take  1 tablet  Daily  for Cholesterol                                         /                       TAKE                                         BY                                             MOUTH                                                    ONE TIME DAILY   tadalafil (CIALIS) 20 MG tablet Take 1/2 to 1 tablet every 2 to 3 days as needed for XXXX tadalafil (CIALIS) 20 MG tablet 30 tablet 2 11/20/2021 Sent to pharmacy as: Tadalafil 20 MG Oral Tablet (Cialis) E-Prescribing Status: Receipt confirmed by pharmacy ( [facility])  (  11/20/2021 12:04 AM EDT)   VITAMIN D PO Take 10,000 Units by mouth daily.    zinc gluconate 50 MG tablet Take 50 mg by mouth daily.   No current facility-administered medications on file prior to visit.     Allergies: No Known Allergies   Medical History:  Past Medical History:  Diagnosis Date   Allergy    Anxiety    On Xanax   Chest pain    Fatigue    GERD (gastroesophageal reflux disease)    Heart murmur    hx of MVP    HTN (hypertension)    Hyperlipemia    IBS (irritable bowel syndrome)    Medication management 06/13/2013   Mitral valve prolapse    Obesity (BMI 30.0-34.9) 08/20/2015   OSA (obstructive sleep apnea)    Restated CPAP   Sleep apnea    no cpap    T2_NIDDM    Testosterone deficiency  08/20/2015   Type II or unspecified type diabetes mellitus without mention of complication, not stated as uncontrolled    Vitamin D deficiency    Family history- Reviewed and unchanged Social history- Reviewed and unchanged   Review of Systems:  Review of Systems  Constitutional:  Negative for malaise/fatigue and weight loss.  HENT:  Negative for hearing loss and tinnitus.   Eyes:  Negative for blurred vision and double vision.  Respiratory:  Negative for cough, shortness of breath and wheezing.   Cardiovascular:  Negative for chest pain, palpitations, orthopnea, claudication and leg swelling.  Gastrointestinal:  Positive for heartburn. Negative for abdominal pain, blood in stool, constipation, diarrhea, melena, nausea and vomiting.  Genitourinary: Negative.   Musculoskeletal:  Negative for joint pain and myalgias.  Skin:  Negative for rash.  Neurological:  Negative for dizziness, tingling, sensory change, weakness and headaches.  Endo/Heme/Allergies:  Negative for polydipsia.  Psychiatric/Behavioral:  Negative for depression and substance abuse. The patient is not nervous/anxious and does not have insomnia.   All other systems reviewed and are negative.   Physical Exam: There were no vitals taken for this visit. Wt Readings from Last 3 Encounters:  11/09/22 216 lb 6.4 oz (98.2 kg)  08/10/22 217 lb 9.6 oz (98.7 kg)  04/03/22 213 lb 12.8 oz (97 kg)   General Appearance: Well nourished, in no apparent distress. Eyes: PERRLA, EOMs, conjunctiva no swelling or erythema Sinuses: No Frontal/maxillary tenderness ENT/Mouth: Ext aud canals clear, TMs without erythema, bulging. No erythema, swelling, or exudate on post pharynx.  Tonsils not swollen or erythematous. Hearing normal.  Neck: Supple, thyroid normal.  Respiratory: Respiratory effort normal, BS equal bilaterally without rales, rhonchi, wheezing or stridor.  Cardio: RRR with no MRGs. Brisk peripheral pulses without edema.  Abdomen:  Soft, + BS.  Mild epigastric tenderness, no guarding, rebound, hernias, masses. Lymphatics: Non tender without lymphadenopathy.  Musculoskeletal: Full ROM, 5/5 strength, Normal gait Skin: Warm, dry without rashes, lesions, ecchymosis.  Neuro: Cranial nerves intact. No cerebellar symptoms.  Psych: Awake and oriented X 3, normal affect, Insight and Judgment appropriate.    Raynelle Dick, NP 3:46 PM [facility]

## 2023-02-15 ENCOUNTER — Ambulatory Visit: Payer: BC Managed Care – PPO | Admitting: Nurse Practitioner

## 2023-02-15 DIAGNOSIS — K219 Gastro-esophageal reflux disease without esophagitis: Secondary | ICD-10-CM

## 2023-02-15 DIAGNOSIS — E1122 Type 2 diabetes mellitus with diabetic chronic kidney disease: Secondary | ICD-10-CM

## 2023-02-15 DIAGNOSIS — E1169 Type 2 diabetes mellitus with other specified complication: Secondary | ICD-10-CM

## 2023-02-15 DIAGNOSIS — E559 Vitamin D deficiency, unspecified: Secondary | ICD-10-CM

## 2023-02-15 DIAGNOSIS — I2510 Atherosclerotic heart disease of native coronary artery without angina pectoris: Secondary | ICD-10-CM

## 2023-02-15 DIAGNOSIS — Z79899 Other long term (current) drug therapy: Secondary | ICD-10-CM

## 2023-02-15 DIAGNOSIS — G4733 Obstructive sleep apnea (adult) (pediatric): Secondary | ICD-10-CM

## 2023-02-15 DIAGNOSIS — I700 Atherosclerosis of aorta: Secondary | ICD-10-CM

## 2023-03-06 NOTE — Progress Notes (Deleted)
FOLLOW UP  Assessment and Plan:   Atherosclerosis of aorta (HCC)  -per CT 10/2018 Control blood pressure, cholesterol, glucose, increase exercise.   CAD Control blood pressure, cholesterol, glucose, increase exercise.  Est with Dr. Dulce Sellar  Hypertension Well controlled with current medications; on ARB Monitor blood pressure at home; patient to call if consistently greater than 130/80 Continue DASH diet.   Reminder to go to the ER if any CP, SOB, nausea, dizziness, severe HA, changes vision/speech, left arm numbness and tingling and jaw pain.  Hyperlipidemia with Type 2 Diabetes Mellitus(HCC) Continue statin medication for LDL goal <70 Continue low cholesterol diet and exercise.  Check lipid panel.   Diabetes with CKD stage 2(HCC) Continue medication: metformin, tulicity RESTART CHECKING SUGARS Continue diet and exercise.  Perform daily foot/skin check, notify office of any concerning changes.  Check A1C  CKD stage 2 with type 2 diabetes mellitus(HCC) Increase fluids, avoid NSAIDS, monitor sugars, will monitor  Erectile dysfunction with Type 2 Diabetes Mellitus(HCC)  Anxiety Well managed by current regimen;  Stress management techniques discussed, increase water, good sleep hygiene discussed, increase exercise, and increase veggies.   Type 2 Diabetes Mellitus with Morbid obesity - BMI 30 + with OSA, T2DM Long discussion about weight loss, diet, and exercise Discussed ideal weight for height and initial weight goal Patient will work on portion sizes and gentle exercise Will follow up in 3 months  Vitamin D Def/ osteoporosis prevention Continue supplementation; advised to reduce from 10000 IU to 7000-8000 IU, 5000 IU was too low in the past Defer vitamin D  OSA Discussed risks of complications with CPAP non-compliance including cardiac arrhythmias; he requests referral back to Dr. Vickey Huger  GERD Initiated medication for new reflux related symptoms: famotidine 20 mg  BID Discussed diet, avoiding triggers and other lifestyle changes   Continue diet and meds as discussed. Further disposition pending results of labs. Discussed med's effects and SE's.   Over 30 minutes of exam, counseling, chart review, and critical decision making was performed.   Future Appointments  Date Time Provider Department Center  03/07/2023 10:45 AM Raynelle Dick, NP [facility] None  05/24/2023  4:00 PM Lucky Cowboy, MD [facility] None  08/23/2023  4:00 PM Raynelle Dick, NP [facility] None  11/22/2023  3:00 PM Lucky Cowboy, MD [facility] None    ----------------------------------------------------------------------------------------------------------------------  HPI 72 y.o. male  presents for 3 month follow up on hypertension, cholesterol, T2 diabetes, CKD, vitamin D def, CAD, anxiety.  he has a diagnosis of anxiety and is currently prescribed celexa 40 mg daily, reports symptoms are well controlled on current regimen.   He is on CPAP for mild/mod OSA per sleep study 10/17/2018 by Dr. Vickey Huger, reports never got machine fully adjusted, hasn't used in over 1 year.   BMI is There is no height or weight on file to calculate BMI., he has been working on diet and exercises, trying to use stairs more, less exertional dyspnea.  Wt Readings from Last 3 Encounters:  11/09/22 216 lb 6.4 oz (98.2 kg)  08/10/22 217 lb 9.6 oz (98.7 kg)  04/03/22 213 lb 12.8 oz (97 kg)   he has a diagnosis of GERD which is currently managed by protonix 40 mg daily after poor control with H2i    He has aortic atherosclerosis per CT 10/2018. Coronary calcium score 301, then had CTA with about 50 % at LAD per Dr. Dulce Sellar.   His blood pressure has been controlled at home (110-120s/70-80s), today their BP is  He does workout. He denies chest pain, shortness of breath. He has had some episodes of dizziness with position changes, admits typically on days working out of the office and not hydrating  well    He is on cholesterol medication (rosuvasatin 20 mg daily) and denies myalgias. His cholesterol is at goal. The cholesterol last visit was:   Lab Results  Component Value Date   CHOL 129 11/09/2022   HDL 43 11/09/2022   LDLCALC 68 11/09/2022   TRIG 99 11/09/2022   CHOLHDL 3.0 11/09/2022    He has been working on diet and exercise for T2 diabetes (on metformin 2000 mg daily, trulicity 4.5 mg/week), and denies increased appetite, nausea, paresthesia of the feet, polydipsia, polyuria, visual disturbances and vomiting.  Last A1C in the office was:  Lab Results  Component Value Date   HGBA1C 7.1 (H) 11/09/2022   Lab Results  Component Value Date   EGFR 96 11/09/2022   Patient is on Vitamin D supplement but remained below goal at the last check, hasn't changed dose, still taking 56433 IU daily, suggested he reduce to 7000-8000 IU daily  Lab Results  Component Value Date   VD25OH 98 11/09/2022       Current Medications:  Current Outpatient Medications on File Prior to Visit  Medication Sig   Acetaminophen (TYLENOL PO) Take by mouth.   aspirin 81 MG tablet Take 81 mg by mouth daily.   atenolol (TENORMIN) 100 MG tablet TAKE ONE TABLET BY MOUTH EVERY DAY FOR BLOOD PRESSURE   citalopram (CELEXA) 40 MG tablet TAKE ONE TABLET  DAILY FOR MOOD   Continuous Blood Gluc Sensor (FREESTYLE LIBRE 14 DAY SENSOR) MISC USE TO CHECK BLOOD SUGARS THREE TIMES A DAY AS DIRECTED   Dulaglutide (TRULICITY) 4.5 MG/0.5ML SOPN Inject  1 pen (4.5 mg)  into skin  every 7 days  for Diabetes  ( e11.29)   glucose blood (ONETOUCH VERIO) test strip Check blood sugar 1 time a day-DX-E11.22.   ibuprofen (ADVIL) 800 MG tablet Take 800 mg by mouth every 8 (eight) hours as needed.   losartan-hydrochlorothiazide (HYZAAR) 100-25 MG tablet Take  1 tablet Daily for BP                                                                                                   /                                                                    TAKE                                         BY  MOUTH                ONE ? ? ? TIME ? ? ? DAILY   Magnesium 400 MG CAPS Take 1 capsuile Daily (Patient taking differently: 500 mg. Takes 2 capsules Daily)   metFORMIN (GLUCOPHAGE-XR) 500 MG 24 hr tablet Take  2 tablets  2 x / day with Meals for Diabetes                                                                 /                                                                   TAKE                                         BY                                                 MOUTH   mupirocin ointment (BACTROBAN) 2 % Apply 1 Application topically 2 (two) times daily. Apply to arm   phenol (CHLORASEPTIC) 1.4 % LIQD Use as directed 1 spray in the mouth or throat as needed for throat irritation / pain.   rosuvastatin (CRESTOR) 20 MG tablet Take  1 tablet  Daily  for Cholesterol                                         /                       TAKE                                         BY                                             MOUTH                                                    ONE TIME DAILY   tadalafil (CIALIS) 20 MG tablet Take 1/2 to 1 tablet every 2 to 3 days as needed for XXXX tadalafil (CIALIS) 20 MG tablet 30 tablet 2 11/20/2021 Sent to pharmacy as: Tadalafil 20 MG Oral Tablet (Cialis) E-Prescribing Status: Receipt confirmed by pharmacy ( [facility])  (11/20/2021  12:04 AM EDT)   VITAMIN D PO Take 10,000 Units by mouth daily.    zinc gluconate 50 MG tablet Take 50 mg by mouth daily.   No current facility-administered medications on file prior to visit.     Allergies: No Known Allergies   Medical History:  Past Medical History:  Diagnosis Date   Allergy    Anxiety    On Xanax   Chest pain    Fatigue    GERD (gastroesophageal reflux disease)    Heart murmur    hx of MVP    HTN (hypertension)    Hyperlipemia    IBS (irritable bowel syndrome)    Medication management 06/13/2013    Mitral valve prolapse    Obesity (BMI 30.0-34.9) 08/20/2015   OSA (obstructive sleep apnea)    Restated CPAP   Sleep apnea    no cpap    T2_NIDDM    Testosterone deficiency 08/20/2015   Type II or unspecified type diabetes mellitus without mention of complication, not stated as uncontrolled    Vitamin D deficiency    Family history- Reviewed and unchanged Social history- Reviewed and unchanged   Review of Systems:  Review of Systems  Constitutional:  Negative for malaise/fatigue and weight loss.  HENT:  Negative for hearing loss and tinnitus.   Eyes:  Negative for blurred vision and double vision.  Respiratory:  Negative for cough, shortness of breath and wheezing.   Cardiovascular:  Negative for chest pain, palpitations, orthopnea, claudication and leg swelling.  Gastrointestinal:  Positive for heartburn. Negative for abdominal pain, blood in stool, constipation, diarrhea, melena, nausea and vomiting.  Genitourinary: Negative.   Musculoskeletal:  Negative for joint pain and myalgias.  Skin:  Negative for rash.  Neurological:  Negative for dizziness, tingling, sensory change, weakness and headaches.  Endo/Heme/Allergies:  Negative for polydipsia.  Psychiatric/Behavioral:  Negative for depression and substance abuse. The patient is not nervous/anxious and does not have insomnia.   All other systems reviewed and are negative.   Physical Exam: There were no vitals taken for this visit. Wt Readings from Last 3 Encounters:  11/09/22 216 lb 6.4 oz (98.2 kg)  08/10/22 217 lb 9.6 oz (98.7 kg)  04/03/22 213 lb 12.8 oz (97 kg)   General Appearance: Well nourished, in no apparent distress. Eyes: PERRLA, EOMs, conjunctiva no swelling or erythema Sinuses: No Frontal/maxillary tenderness ENT/Mouth: Ext aud canals clear, TMs without erythema, bulging. No erythema, swelling, or exudate on post pharynx.  Tonsils not swollen or erythematous. Hearing normal.  Neck: Supple, thyroid normal.   Respiratory: Respiratory effort normal, BS equal bilaterally without rales, rhonchi, wheezing or stridor.  Cardio: RRR with no MRGs. Brisk peripheral pulses without edema.  Abdomen: Soft, + BS.  Mild epigastric tenderness, no guarding, rebound, hernias, masses. Lymphatics: Non tender without lymphadenopathy.  Musculoskeletal: Full ROM, 5/5 strength, Normal gait Skin: Warm, dry without rashes, lesions, ecchymosis.  Neuro: Cranial nerves intact. No cerebellar symptoms.  Psych: Awake and oriented X 3, normal affect, Insight and Judgment appropriate.    Raynelle Dick, NP 12:37 PM [facility]

## 2023-03-07 ENCOUNTER — Ambulatory Visit: Payer: BC Managed Care – PPO | Admitting: Nurse Practitioner

## 2023-03-13 NOTE — Progress Notes (Unsigned)
FOLLOW UP  Assessment and Plan:   Atherosclerosis of aorta (HCC)  -per CT 10/2018 Control blood pressure, cholesterol, glucose, increase exercise.   CAD Control blood pressure, cholesterol, glucose, increase exercise.  Est with Dr. Dulce Sellar  Hypertension Well controlled with current medications; on ARB Monitor blood pressure at home; patient to call if consistently greater than 130/80 Continue DASH diet.   Reminder to go to the ER if any CP, SOB, nausea, dizziness, severe HA, changes vision/speech, left arm numbness and tingling and jaw pain.  Hyperlipidemia with Type 2 Diabetes Mellitus(HCC) Continue statin medication for LDL goal <70 Continue low cholesterol diet and exercise.  Check lipid panel.   Diabetes with CKD stage 2(HCC) Continue medication: metformin, tulicity RESTART CHECKING SUGARS Continue diet and exercise.  Perform daily foot/skin check, notify office of any concerning changes.  Check A1C  CKD stage 2 with type 2 diabetes mellitus(HCC) Increase fluids, avoid NSAIDS, monitor sugars, will monitor  Erectile dysfunction with Type 2 Diabetes Mellitus(HCC)  Anxiety Well managed by current regimen;  Stress management techniques discussed, increase water, good sleep hygiene discussed, increase exercise, and increase veggies.   Type 2 Diabetes Mellitus with Morbid obesity - BMI 30 + with OSA, T2DM Long discussion about weight loss, diet, and exercise Discussed ideal weight for height and initial weight goal Patient will work on portion sizes and gentle exercise Will follow up in 3 months  Vitamin D Def/ osteoporosis prevention Continue supplementation; advised to reduce from 10000 IU to 7000-8000 IU, 5000 IU was too low in the past Defer vitamin D  OSA Discussed risks of complications with CPAP non-compliance including cardiac arrhythmias; he requests referral back to Dr. Vickey Huger  GERD Initiated medication for new reflux related symptoms: famotidine 20 mg  BID Discussed diet, avoiding triggers and other lifestyle changes   Continue diet and meds as discussed. Further disposition pending results of labs. Discussed med's effects and SE's.   Over 30 minutes of exam, counseling, chart review, and critical decision making was performed.   Future Appointments  Date Time Provider Department Center  03/15/2023  4:00 PM Raynelle Dick, NP [facility] None  05/24/2023  4:00 PM Lucky Cowboy, MD [facility] None  08/23/2023  4:00 PM Raynelle Dick, NP [facility] None  11/22/2023  3:00 PM Lucky Cowboy, MD [facility] None    ----------------------------------------------------------------------------------------------------------------------  HPI 72 y.o. male  presents for 3 month follow up on hypertension, cholesterol, T2 diabetes, CKD, vitamin D def, CAD, anxiety.  he has a diagnosis of anxiety and is currently prescribed celexa 40 mg daily, reports symptoms are well controlled on current regimen.   He is on CPAP for mild/mod OSA per sleep study 10/17/2018 by Dr. Vickey Huger, reports never got machine fully adjusted, hasn't used in over 1 year.   BMI is There is no height or weight on file to calculate BMI., he has been working on diet and exercises, trying to use stairs more, less exertional dyspnea.  Wt Readings from Last 3 Encounters:  11/09/22 216 lb 6.4 oz (98.2 kg)  08/10/22 217 lb 9.6 oz (98.7 kg)  04/03/22 213 lb 12.8 oz (97 kg)   he has a diagnosis of GERD which is currently managed by protonix 40 mg daily after poor control with H2i    He has aortic atherosclerosis per CT 10/2018. Coronary calcium score 301, then had CTA with about 50 % at LAD per Dr. Dulce Sellar.   His blood pressure has been controlled at home (110-120s/70-80s), today their BP is  He does workout. He denies chest pain, shortness of breath. He has had some episodes of dizziness with position changes, admits typically on days working out of the office and not hydrating  well    He is on cholesterol medication (rosuvasatin 20 mg daily) and denies myalgias. His cholesterol is at goal. The cholesterol last visit was:   Lab Results  Component Value Date   CHOL 129 11/09/2022   HDL 43 11/09/2022   LDLCALC 68 11/09/2022   TRIG 99 11/09/2022   CHOLHDL 3.0 11/09/2022    He has been working on diet and exercise for T2 diabetes (on metformin 2000 mg daily, trulicity 4.5 mg/week), and denies increased appetite, nausea, paresthesia of the feet, polydipsia, polyuria, visual disturbances and vomiting.  Last A1C in the office was:  Lab Results  Component Value Date   HGBA1C 7.1 (H) 11/09/2022   Lab Results  Component Value Date   EGFR 96 11/09/2022   Patient is on Vitamin D supplement but remained below goal at the last check, hasn't changed dose, still taking 69629 IU daily, suggested he reduce to 7000-8000 IU daily  Lab Results  Component Value Date   VD25OH 98 11/09/2022       Current Medications:  Current Outpatient Medications on File Prior to Visit  Medication Sig   Acetaminophen (TYLENOL PO) Take by mouth.   aspirin 81 MG tablet Take 81 mg by mouth daily.   atenolol (TENORMIN) 100 MG tablet TAKE ONE TABLET BY MOUTH EVERY DAY FOR BLOOD PRESSURE   citalopram (CELEXA) 40 MG tablet TAKE ONE TABLET  DAILY FOR MOOD   Continuous Blood Gluc Sensor (FREESTYLE LIBRE 14 DAY SENSOR) MISC USE TO CHECK BLOOD SUGARS THREE TIMES A DAY AS DIRECTED   Dulaglutide (TRULICITY) 4.5 MG/0.5ML SOPN Inject  1 pen (4.5 mg)  into skin  every 7 days  for Diabetes  ( e11.29)   glucose blood (ONETOUCH VERIO) test strip Check blood sugar 1 time a day-DX-E11.22.   ibuprofen (ADVIL) 800 MG tablet Take 800 mg by mouth every 8 (eight) hours as needed.   losartan-hydrochlorothiazide (HYZAAR) 100-25 MG tablet Take  1 tablet Daily for BP                                                                                                   /                                                                    TAKE                                         BY  MOUTH                ONE ? ? ? TIME ? ? ? DAILY   Magnesium 400 MG CAPS Take 1 capsuile Daily (Patient taking differently: 500 mg. Takes 2 capsules Daily)   metFORMIN (GLUCOPHAGE-XR) 500 MG 24 hr tablet Take  2 tablets  2 x / day with Meals for Diabetes                                                                 /                                                                   TAKE                                         BY                                                 MOUTH   mupirocin ointment (BACTROBAN) 2 % Apply 1 Application topically 2 (two) times daily. Apply to arm   phenol (CHLORASEPTIC) 1.4 % LIQD Use as directed 1 spray in the mouth or throat as needed for throat irritation / pain.   rosuvastatin (CRESTOR) 20 MG tablet Take  1 tablet  Daily  for Cholesterol                                         /                       TAKE                                         BY                                             MOUTH                                                    ONE TIME DAILY   tadalafil (CIALIS) 20 MG tablet Take 1/2 to 1 tablet every 2 to 3 days as needed for XXXX tadalafil (CIALIS) 20 MG tablet 30 tablet 2 11/20/2021 Sent to pharmacy as: Tadalafil 20 MG Oral Tablet (Cialis) E-Prescribing Status: Receipt confirmed by pharmacy ( [facility])  (11/20/2021  12:04 AM EDT)   VITAMIN D PO Take 10,000 Units by mouth daily.    zinc gluconate 50 MG tablet Take 50 mg by mouth daily.   No current facility-administered medications on file prior to visit.     Allergies: No Known Allergies   Medical History:  Past Medical History:  Diagnosis Date   Allergy    Anxiety    On Xanax   Chest pain    Fatigue    GERD (gastroesophageal reflux disease)    Heart murmur    hx of MVP    HTN (hypertension)    Hyperlipemia    IBS (irritable bowel syndrome)    Medication management 06/13/2013    Mitral valve prolapse    Obesity (BMI 30.0-34.9) 08/20/2015   OSA (obstructive sleep apnea)    Restated CPAP   Sleep apnea    no cpap    T2_NIDDM    Testosterone deficiency 08/20/2015   Type II or unspecified type diabetes mellitus without mention of complication, not stated as uncontrolled    Vitamin D deficiency    Family history- Reviewed and unchanged Social history- Reviewed and unchanged   Review of Systems:  Review of Systems  Constitutional:  Negative for malaise/fatigue and weight loss.  HENT:  Negative for hearing loss and tinnitus.   Eyes:  Negative for blurred vision and double vision.  Respiratory:  Negative for cough, shortness of breath and wheezing.   Cardiovascular:  Negative for chest pain, palpitations, orthopnea, claudication and leg swelling.  Gastrointestinal:  Positive for heartburn. Negative for abdominal pain, blood in stool, constipation, diarrhea, melena, nausea and vomiting.  Genitourinary: Negative.   Musculoskeletal:  Negative for joint pain and myalgias.  Skin:  Negative for rash.  Neurological:  Negative for dizziness, tingling, sensory change, weakness and headaches.  Endo/Heme/Allergies:  Negative for polydipsia.  Psychiatric/Behavioral:  Negative for depression and substance abuse. The patient is not nervous/anxious and does not have insomnia.   All other systems reviewed and are negative.   Physical Exam: There were no vitals taken for this visit. Wt Readings from Last 3 Encounters:  11/09/22 216 lb 6.4 oz (98.2 kg)  08/10/22 217 lb 9.6 oz (98.7 kg)  04/03/22 213 lb 12.8 oz (97 kg)   General Appearance: Well nourished, in no apparent distress. Eyes: PERRLA, EOMs, conjunctiva no swelling or erythema Sinuses: No Frontal/maxillary tenderness ENT/Mouth: Ext aud canals clear, TMs without erythema, bulging. No erythema, swelling, or exudate on post pharynx.  Tonsils not swollen or erythematous. Hearing normal.  Neck: Supple, thyroid normal.   Respiratory: Respiratory effort normal, BS equal bilaterally without rales, rhonchi, wheezing or stridor.  Cardio: RRR with no MRGs. Brisk peripheral pulses without edema.  Abdomen: Soft, + BS.  Mild epigastric tenderness, no guarding, rebound, hernias, masses. Lymphatics: Non tender without lymphadenopathy.  Musculoskeletal: Full ROM, 5/5 strength, Normal gait Skin: Warm, dry without rashes, lesions, ecchymosis.  Neuro: Cranial nerves intact. No cerebellar symptoms.  Psych: Awake and oriented X 3, normal affect, Insight and Judgment appropriate.    Raynelle Dick, NP 1:50 PM [facility]

## 2023-03-15 ENCOUNTER — Ambulatory Visit (INDEPENDENT_AMBULATORY_CARE_PROVIDER_SITE_OTHER): Payer: BC Managed Care – PPO | Admitting: Nurse Practitioner

## 2023-03-15 ENCOUNTER — Ambulatory Visit: Payer: BC Managed Care – PPO | Admitting: Nurse Practitioner

## 2023-03-15 ENCOUNTER — Encounter: Payer: Self-pay | Admitting: Nurse Practitioner

## 2023-03-15 VITALS — BP 124/80 | HR 68 | Temp 97.5°F | Ht 72.0 in | Wt 220.0 lb

## 2023-03-15 DIAGNOSIS — I700 Atherosclerosis of aorta: Secondary | ICD-10-CM | POA: Diagnosis not present

## 2023-03-15 DIAGNOSIS — E1122 Type 2 diabetes mellitus with diabetic chronic kidney disease: Secondary | ICD-10-CM | POA: Diagnosis not present

## 2023-03-15 DIAGNOSIS — K219 Gastro-esophageal reflux disease without esophagitis: Secondary | ICD-10-CM

## 2023-03-15 DIAGNOSIS — E559 Vitamin D deficiency, unspecified: Secondary | ICD-10-CM | POA: Diagnosis not present

## 2023-03-15 DIAGNOSIS — N182 Chronic kidney disease, stage 2 (mild): Secondary | ICD-10-CM

## 2023-03-15 DIAGNOSIS — Z23 Encounter for immunization: Secondary | ICD-10-CM

## 2023-03-15 DIAGNOSIS — E785 Hyperlipidemia, unspecified: Secondary | ICD-10-CM

## 2023-03-15 DIAGNOSIS — F419 Anxiety disorder, unspecified: Secondary | ICD-10-CM | POA: Diagnosis not present

## 2023-03-15 DIAGNOSIS — E1169 Type 2 diabetes mellitus with other specified complication: Secondary | ICD-10-CM

## 2023-03-15 DIAGNOSIS — G4733 Obstructive sleep apnea (adult) (pediatric): Secondary | ICD-10-CM

## 2023-03-15 DIAGNOSIS — Z79899 Other long term (current) drug therapy: Secondary | ICD-10-CM

## 2023-03-15 DIAGNOSIS — N521 Erectile dysfunction due to diseases classified elsewhere: Secondary | ICD-10-CM

## 2023-03-15 NOTE — Patient Instructions (Signed)

## 2023-03-16 LAB — LIPID PANEL
Cholesterol: 121 mg/dL (ref <200)
HDL: 39 mg/dL — ABNORMAL LOW (ref >=40)
LDL Cholesterol (Calc): 65 mg/dL
Non-HDL Cholesterol (Calc): 82 mg/dL (ref <130)
Total CHOL/HDL Ratio: 3.1 (calc) (ref <5.0)
Triglycerides: 85 mg/dL (ref <150)

## 2023-03-16 LAB — CBC WITH DIFFERENTIAL/PLATELET
Absolute Lymphocytes: 4207 {cells}/uL — ABNORMAL HIGH (ref 850–3900)
Absolute Monocytes: 406 {cells}/uL (ref 200–950)
Basophils Absolute: 63 {cells}/uL (ref 0–200)
Basophils Relative: 0.9 %
Eosinophils Absolute: 287 {cells}/uL (ref 15–500)
Eosinophils Relative: 4.1 %
HCT: 38.5 % (ref 38.5–50.0)
Hemoglobin: 12.6 g/dL — ABNORMAL LOW (ref 13.2–17.1)
MCH: 30.4 pg (ref 27.0–33.0)
MCHC: 32.7 g/dL (ref 32.0–36.0)
MCV: 92.8 fL (ref 80.0–100.0)
MPV: 10.2 fL (ref 7.5–12.5)
Monocytes Relative: 5.8 %
Neutro Abs: 2037 {cells}/uL (ref 1500–7800)
Neutrophils Relative %: 29.1 %
Platelets: 273 10*3/uL (ref 140–400)
RBC: 4.15 10*6/uL — ABNORMAL LOW (ref 4.20–5.80)
RDW: 12 % (ref 11.0–15.0)
Total Lymphocyte: 60.1 %
WBC: 7 10*3/uL (ref 3.8–10.8)

## 2023-03-16 LAB — COMPLETE METABOLIC PANEL WITH GFR
AG Ratio: 1.5 (calc) (ref 1.0–2.5)
ALT: 31 U/L (ref 9–46)
AST: 24 U/L (ref 10–35)
Albumin: 4.3 g/dL (ref 3.6–5.1)
Alkaline phosphatase (APISO): 66 U/L (ref 35–144)
BUN: 14 mg/dL (ref 7–25)
CO2: 32 mmol/L (ref 20–32)
Calcium: 10.2 mg/dL (ref 8.6–10.3)
Chloride: 100 mmol/L (ref 98–110)
Creat: 0.87 mg/dL (ref 0.70–1.28)
Globulin: 2.9 g/dL (ref 1.9–3.7)
Glucose, Bld: 88 mg/dL (ref 65–99)
Potassium: 4 mmol/L (ref 3.5–5.3)
Sodium: 139 mmol/L (ref 135–146)
Total Bilirubin: 0.5 mg/dL (ref 0.2–1.2)
Total Protein: 7.2 g/dL (ref 6.1–8.1)
eGFR: 93 mL/min/{1.73_m2} (ref >=60)

## 2023-03-16 LAB — HEMOGLOBIN A1C W/OUT EAG: Hgb A1c MFr Bld: 7.2 %{Hb} — ABNORMAL HIGH (ref <5.7)

## 2023-05-18 ENCOUNTER — Telehealth: Payer: Self-pay

## 2023-05-18 ENCOUNTER — Ambulatory Visit (INDEPENDENT_AMBULATORY_CARE_PROVIDER_SITE_OTHER): Payer: PPO

## 2023-05-18 ENCOUNTER — Ambulatory Visit
Admission: EM | Admit: 2023-05-18 | Discharge: 2023-05-18 | Disposition: A | Discharge status: Home / Self Care | Payer: 59 | Attending: Family Medicine | Admitting: Family Medicine

## 2023-05-18 DIAGNOSIS — E119 Type 2 diabetes mellitus without complications: Secondary | ICD-10-CM | POA: Diagnosis not present

## 2023-05-18 DIAGNOSIS — M5412 Radiculopathy, cervical region: Secondary | ICD-10-CM

## 2023-05-18 MED ORDER — CYCLOBENZAPRINE HCL 5 MG PO TABS: 5.0000 mg | ORAL_TABLET | Freq: Every evening | ORAL | 0 refills | Status: DC | PRN

## 2023-05-18 MED ORDER — PREDNISONE 20 MG PO TABS: ORAL_TABLET | ORAL | 0 refills | Status: DC

## 2023-05-18 NOTE — ED Triage Notes (Addendum)
 Pt c/o LUE pain x 45 days-seen by PCP-no dx, meds or plan of care-taking tylenol with mild relief-NAD-steady gait

## 2023-05-18 NOTE — Telephone Encounter (Signed)
 Patient called stating he was having extreme left arm pain. It has been going on since he was last seen in October. He denies any chest pain or SOB. Since we have no appointments today I recommend him going to an Urgent Care that can do x-rays if needed. He did make a follow up appointment with our office on 05/28/23.

## 2023-05-18 NOTE — ED Provider Notes (Signed)
 [facility]  Note:  This document was prepared using Conservation officer, historic buildings and may include unintentional dictation errors.  MRN: [id number] DOB: [birth year]  Subjective:   Wayne [name] is a 73 y.o. male presenting for 45 day history of persistent left arm pain from the base of the neck to the fingers. Has numbness and tingling in the hand. No fall, trauma or injury. No known trauma. Has seen his PCP. No medications prescribed by his PCP. Has a history of heart disease. No history of heart attack.  Has type 2 diabetes treated without insulin . Last A1c was from 7.2%.  Has a history of lumbar spondylosis, chronic low back pain with left-sided sciatica.  Has had MRIs done and works with a neurologist for this.  Has not had any kind of testing done for his cervical region through that practice.  No current facility-administered medications for this encounter.  Current Outpatient Medications:    Acetaminophen  (TYLENOL  PO), Take by mouth., Disp: , Rfl:    aspirin 81 MG tablet, Take 81 mg by mouth daily., Disp: , Rfl:    atenolol  (TENORMIN ) 100 MG tablet, TAKE ONE TABLET BY MOUTH EVERY DAY FOR BLOOD PRESSURE, Disp: 90 tablet, Rfl: 0   citalopram  (CELEXA ) 40 MG tablet, TAKE ONE TABLET  DAILY FOR MOOD, Disp: 90 tablet, Rfl: 3   Continuous Blood Gluc Sensor (FREESTYLE LIBRE 14 DAY SENSOR) MISC, USE TO CHECK BLOOD SUGARS THREE TIMES A DAY AS DIRECTED, Disp: 3 each, Rfl: 3   Dulaglutide  (TRULICITY ) 4.5 MG/0.5ML SOPN, Inject  1 pen (4.5 mg)  into skin  every 7 days  for Diabetes  ( e11.29), Disp: 6 mL, Rfl: 3   glucose blood (ONETOUCH VERIO) test strip, Check blood sugar 1 time a day-DX-E11.22., Disp: 100 each, Rfl: 4   ibuprofen (ADVIL) 800 MG tablet, Take 800 mg by mouth every 8 (eight) hours as needed., Disp: , Rfl:    losartan -hydrochlorothiazide (HYZAAR) 100-25 MG tablet, Take  1 tablet Daily for BP                                                                                                    /                                                                   TAKE                                         BY  MOUTH                ONE ? ? ? TIME ? ? ? DAILY, Disp: 90 tablet, Rfl: 3   Magnesium  400 MG CAPS, Take 1 capsuile Daily (Patient taking differently: 500 mg. Takes 2 capsules Daily), Disp: , Rfl:    metFORMIN  (GLUCOPHAGE -XR) 500 MG 24 hr tablet, Take  2 tablets  2 x / day with Meals for Diabetes                                                                 /                                                                   TAKE                                         BY                                                 MOUTH, Disp: 360 tablet, Rfl: 3   mupirocin  ointment (BACTROBAN ) 2 %, Apply 1 Application topically 2 (two) times daily. Apply to arm, Disp: 22 g, Rfl: 0   phenol (CHLORASEPTIC) 1.4 % LIQD, Use as directed 1 spray in the mouth or throat as needed for throat irritation / pain. (Patient not taking: Reported on 03/15/2023), Disp: 118 mL, Rfl: 0   rosuvastatin  (CRESTOR ) 20 MG tablet, Take  1 tablet  Daily  for Cholesterol                                         /                       TAKE                                         BY                                             MOUTH                                                    ONE TIME DAILY, Disp: 90 tablet, Rfl: 3   tadalafil  (CIALIS ) 20 MG tablet, Take 1/2 to 1 tablet every 2 to  3 days as needed for XXXX tadalafil  (CIALIS ) 20 MG tablet 30 tablet 2 11/20/2021 Sent to pharmacy as: Tadalafil  20 MG Oral Tablet (Cialis ) E-Prescribing Status: Receipt confirmed by pharmacy ( [facility])  (11/20/2021 12:04 AM EDT), Disp: 30 tablet, Rfl: 2   VITAMIN D  PO, Take 10,000 Units by mouth daily. , Disp: , Rfl:    zinc  gluconate 50 MG tablet, Take 50 mg by mouth daily., Disp: , Rfl:    No Known Allergies  Past Medical History:  Diagnosis Date   Allergy    Anxiety    On  Xanax    Chest pain    Fatigue    GERD (gastroesophageal reflux disease)    Heart murmur    hx of MVP    HTN (hypertension)    Hyperlipemia    IBS (irritable bowel syndrome)    Medication management 06/13/2013   Mitral valve prolapse    Obesity (BMI 30.0-34.9) 08/20/2015   OSA (obstructive sleep apnea)    Restated CPAP   Sleep apnea    no cpap    T2_NIDDM    Testosterone  deficiency 08/20/2015   Type II or unspecified type diabetes mellitus without mention of complication, not stated as uncontrolled    Vitamin D  deficiency      Past Surgical History:  Procedure Laterality Date   COLONOSCOPY     TONSILLECTOMY     WISDOM TOOTH EXTRACTION      Family History  Problem Relation Age of Onset   Stroke Other    Hypertension Other    Hyperlipidemia Other    Diabetes Other    Colon cancer Father 40   Hypertension Father    Dementia Mother    Alzheimer's disease Mother    Colon polyps Neg Hx    Esophageal cancer Neg Hx    Rectal cancer Neg Hx    Stomach cancer Neg Hx     Social History   Tobacco Use   Smoking status: Former    Current packs/day: 0.00    Average packs/day: 1 pack/day for 19.3 years (19.3 ttl pk-yrs)    Types: Cigarettes    Start date: 83    Quit date: 09/06/2007    Years since quitting: 15.7   Smokeless tobacco: Never  Vaping Use   Vaping status: Never Used  Substance Use Topics   Alcohol use: Yes    Comment: occasional   Drug use: No    ROS   Objective:   Vitals: BP (!) 149/98 (BP Location: Right Arm)   Pulse 69   Temp 97.7 F (36.5 C) (Oral)   Resp 20   SpO2 94%   BP Readings from Last 3 Encounters:  05/18/23 (!) 149/98  03/15/23 124/80  11/09/22 118/70   Physical Exam Constitutional:      General: He is not in acute distress.    Appearance: Normal appearance. He is well-developed and normal weight. He is not ill-appearing, toxic-appearing or diaphoretic.  HENT:     Head: Normocephalic and atraumatic.     Right Ear: External ear  normal.     Left Ear: External ear normal.     Nose: Nose normal.     Mouth/Throat:     Pharynx: Oropharynx is clear.  Eyes:     General: No scleral icterus.       Right eye: No discharge.        Left eye: No discharge.     Extraocular Movements: Extraocular movements intact.  Cardiovascular:  Rate and Rhythm: Normal rate.  Pulmonary:     Effort: Pulmonary effort is normal.  Musculoskeletal:     Cervical back: Normal range of motion. No swelling, edema, deformity, erythema, signs of trauma, lacerations, rigidity, spasms, torticollis, tenderness, bony tenderness or crepitus. Pain with movement (+ Spurling maneuver to the left) present. Normal range of motion.  Neurological:     Mental Status: He is alert and oriented to person, place, and time.  Psychiatric:        Mood and Affect: Mood normal.        Behavior: Behavior normal.        Thought Content: Thought content normal.        Judgment: Judgment normal.     Assessment and Plan :   PDMP not reviewed this encounter.  1. Cervical radiculopathy   2. Type 2 diabetes mellitus treated without insulin  (HCC)    I do not suspect ACS despite patient's history of CAD, type 2 diabetes given the duration of his pain and also the physical exam findings.  Will defer EKG.  He has not responded to ibuprofen, Motrin and Tylenol .  Recommended that he use a prednisone  course and a muscle relaxant.  Follow-up with his neurologist for consideration of an MRI and further workup as deemed appropriate.  Counseled patient on potential for adverse effects with medications prescribed/recommended today, ER and return-to-clinic precautions discussed, patient verbalized understanding.    Christopher Savannah, PA-C 05/18/23 1524

## 2023-05-24 ENCOUNTER — Ambulatory Visit: Payer: 59 | Admitting: Internal Medicine

## 2023-05-28 ENCOUNTER — Encounter: Payer: Self-pay | Admitting: Nurse Practitioner

## 2023-05-28 ENCOUNTER — Ambulatory Visit (INDEPENDENT_AMBULATORY_CARE_PROVIDER_SITE_OTHER): Payer: 59 | Admitting: Nurse Practitioner

## 2023-05-28 VITALS — BP 100/60 | HR 91 | Temp 97.9°F | Ht 72.0 in | Wt 226.4 lb

## 2023-05-28 DIAGNOSIS — M5412 Radiculopathy, cervical region: Secondary | ICD-10-CM

## 2023-05-28 DIAGNOSIS — N182 Chronic kidney disease, stage 2 (mild): Secondary | ICD-10-CM | POA: Diagnosis not present

## 2023-05-28 DIAGNOSIS — E1122 Type 2 diabetes mellitus with diabetic chronic kidney disease: Secondary | ICD-10-CM | POA: Diagnosis not present

## 2023-05-28 NOTE — Patient Instructions (Signed)
 Cervical Radiculopathy  Cervical radiculopathy means that a nerve in the neck (a cervical nerve) is pinched or bruised. This can happen because of an injury to the cervical spine (vertebrae) in the neck, or as a normal part of getting older. This condition can cause pain or loss of feeling (numbness) that runs from your neck all the way down to your arm and fingers. Often, this condition gets better with rest. Treatment may be needed if the condition does not get better. What are the causes? A neck injury. A bulging disk in your spine. Sudden muscle tightening (muscle spasms). Tight muscles in your neck due to overuse. Arthritis. Breakdown in the bones and joints of the spine (spondylosis) due to getting older. Bone spurs that form near the nerves in the neck. What are the signs or symptoms? Pain. The pain may: Run from the neck to the arm and hand. Be very bad or irritating. Get worse when you move your neck. Loss of feeling or tingling in your arm or hand. Weakness in your arm or hand, in very bad cases. How is this treated? In many cases, treatment is not needed for this condition. With rest, the condition often gets better over time. If treatment is needed, options may include: Wearing a soft neck collar (cervical collar) for short periods of time. Doing exercises (physical therapy) to strengthen your neck muscles. Taking medicines. Having shots (injections) in your spine, in very bad cases. Having surgery. This may be needed if other treatments do not help. The type of surgery that is used will depend on the cause of your condition. Follow these instructions at home: If you have a soft neck collar: Wear it as told by your doctor. Take it off only as told by your doctor. Ask your doctor if you can take the collar off for cleaning and bathing. If you are allowed to take the collar off for cleaning or bathing: Follow instructions from your doctor about how to take off the collar  safely. Clean the collar by wiping it with mild soap and water and drying it completely. Take out any removable pads in the collar every 1-2 days. Wash them by hand with soap and water. Let them air-dry completely before you put them back in the collar. Check your skin under the collar for redness or sores. If you see any, tell your doctor. Managing pain     Take over-the-counter and prescription medicines only as told by your doctor. If told, put ice on the painful area. To do this: If you have a soft neck collar, take if off as told by your doctor. Put ice in a plastic bag. Place a towel between your skin and the bag. Leave the ice on for 20 minutes, 2-3 times a day. Take off the ice if your skin turns bright red. This is very important. If you cannot feel pain, heat, or cold, you have a greater risk of damage to the area. If using ice does not help, you can try using heat. Use the heat source that your doctor recommends, such as a moist heat pack or a heating pad. Place a towel between your skin and the heat source. Leave the heat on for 20-30 minutes. Take off the heat if your skin turns bright red. This is very important. If you cannot feel pain, heat, or cold, you have a greater risk of getting burned. You may try a gentle neck and shoulder rub (massage). Activity Rest as needed. Return  to your normal activities when your doctor says that it is safe. Do exercises as told by your doctor or physical therapist. You may have to avoid lifting. Ask your doctor how much you can safely lift. General instructions Use a flat pillow when you sleep. Do not drive while wearing a soft neck collar. If you do not have a soft neck collar, ask your doctor if it is safe to drive while your neck heals. Ask your doctor if you should avoid driving or using machines while you are taking your medicine. Do not smoke or use any products that contain nicotine or tobacco. If you need help quitting, ask your  doctor. Keep all follow-up visits. Contact a doctor if: Your condition does not get better with treatment. Get help right away if: Your pain gets worse and medicine does not help. You lose feeling or feel weak in your hand, arm, face, or leg. You have a high fever. Your neck is stiff. You cannot control when you poop or pee (have incontinence). You have trouble with walking, balance, or talking. Summary Cervical radiculopathy means that a nerve in the neck is pinched or bruised. A nerve can get pinched from a bulging disk, arthritis, an injury to the neck, or other causes. Symptoms include pain, tingling, or loss of feeling that goes from the neck to the arm or hand. Weakness in your arm or hand can happen in very bad cases. Treatment may include resting, wearing a soft neck collar, and doing exercises. You might need to take medicines for pain. In very bad cases, shots or surgery may be needed. This information is not intended to replace advice given to you by your health care provider. Make sure you discuss any questions you have with your health care provider. Document Revised: 11/04/2020 Document Reviewed: 11/04/2020 Elsevier Patient Education  2024 [employer].

## 2023-05-28 NOTE — Progress Notes (Signed)
 Assessment and Plan:  Wayne [name] was seen today for an episodic visit.  Diagnoses and all order for this visit:  1. Cervical radiculopathy (Primary) Review of x-Wayne [name] results discussed at length. Finish course of Prednisone  and Cyclobenzaprine  Discussed benefit of IBU 800 mg QID PRN upon any future exacerbation. Contact Wayne [name] for further review and evaluation of management considering he is established for lumbar epidurals.   2. Type 2 diabetes mellitus with stage 2 chronic kidney disease, without long-term current use of insulin  (HCC) Continue to monitor while taking steroids.  Continue Trulicity  Education: Reviewed 'ABCs' of diabetes management  Discussed goals to be met and/or maintained include A1C (<7) Blood pressure (<130/80) Cholesterol (LDL <70) Continue Eye Exam yearly  Continue Dental Exam Q6 mo Discussed dietary recommendations Discussed Physical Activity recommendations Check A1C  Notify office for further evaluation and treatment, questions or concerns if s/s fail to improve. The risks and benefits of my recommendations, as well as other treatment options were discussed with the patient today. Questions were answered.  Further disposition pending results of labs. Discussed med's effects and SE's.    Over 30 minutes of exam, counseling, chart review, and critical decision making was performed.   Future Appointments  Date Time Provider Department Center  06/18/2023  4:00 PM Wayne Fallow, MD [facility] None  09/17/2023  4:00 PM Wayne Lonell BRAVO, NP [facility] None  12/24/2023  3:00 PM Wayne Fallow, MD [facility] None    ------------------------------------------------------------------------------------------------------------------   HPI BP 100/60   Pulse 91   Temp 97.9 F (36.6 C)   Ht 6' (1.829 m)   Wt 226 lb 6.4 oz (102.7 kg)   SpO2 99%   BMI 30.71 kg/m    Patient presents to clinic for follow up after being seen for neck pain at the  urgent care.    During his Urgent Care visit on 05/18/23 he presented with c/of a 45 day history of persistent left arm pain from the base of the neck to the fingers. Has numbness and tingling in the hand. No fall, trauma or injury. No known trauma.   Has type 2 diabetes treated without insulin . Last A1c was from 7.2%.  Has a history of lumbar spondylosis, chronic low back pain with left-sided sciatica.  Has had MRIs done and works with a neurologist for this.  Has not had any kind of testing done for his cervical region through that practice.   He has not responded to ibuprofen, Motrin and Tylenol .   Recommended that he use a prednisone  course and a muscle relaxant. Follow-up with his neurologist for consideration of an MRI and further workup as deemed appropriate.   He is continuing to take as directed as needed.  Feels Cyclobenzaprine  causes drowsiness and unable to get things completed during the day.  He is established with Wayne [name] for lumbar epidurals and plans to reach out to follow up.  His DM is overall well controlled on Trulicity .  Denies recent episodes of hyperglycemia, polyuria, polydipsia.  Last A1c: Lab Results  Component Value Date   HGBA1C 7.2 (H) 03/15/2023     Past Medical History:  Diagnosis Date   Allergy    Anxiety    On Xanax    Chest pain    Fatigue    GERD (gastroesophageal reflux disease)    Heart murmur    hx of MVP    HTN (hypertension)    Hyperlipemia    IBS (irritable bowel syndrome)    Medication management 06/13/2013  Mitral valve prolapse    Obesity (BMI 30.0-34.9) 08/20/2015   OSA (obstructive sleep apnea)    Restated CPAP   Sleep apnea    no cpap    T2_NIDDM    Testosterone  deficiency 08/20/2015   Type II or unspecified type diabetes mellitus without mention of complication, not stated as uncontrolled    Vitamin D  deficiency      No Known Allergies  Current Outpatient Medications on File Prior to Visit  Medication Sig   Acetaminophen   (TYLENOL  PO) Take by mouth.   aspirin 81 MG tablet Take 81 mg by mouth daily.   atenolol  (TENORMIN ) 100 MG tablet TAKE ONE TABLET BY MOUTH EVERY DAY FOR BLOOD PRESSURE   citalopram  (CELEXA ) 40 MG tablet TAKE ONE TABLET  DAILY FOR MOOD   Continuous Blood Gluc Sensor (FREESTYLE LIBRE 14 DAY SENSOR) MISC USE TO CHECK BLOOD SUGARS THREE TIMES A DAY AS DIRECTED   cyclobenzaprine  (FLEXERIL ) 5 MG tablet Take 1 tablet (5 mg total) by mouth at bedtime as needed.   Dulaglutide  (TRULICITY ) 4.5 MG/0.5ML SOPN Inject  1 pen (4.5 mg)  into skin  every 7 days  for Diabetes  ( e11.29)   glucose blood (ONETOUCH VERIO) test strip Check blood sugar 1 time a day-DX-E11.22.   ibuprofen (ADVIL) 800 MG tablet Take 800 mg by mouth every 8 (eight) hours as needed.   losartan -hydrochlorothiazide (HYZAAR) 100-25 MG tablet Take  1 tablet Daily for BP                                                                                                   /                                                                   TAKE                                         BY                                                 MOUTH                ONE ? ? ? TIME ? ? ? DAILY   Magnesium  400 MG CAPS Take 1 capsuile Daily (Patient taking differently: 500 mg. Takes 2 capsules Daily)   metFORMIN  (GLUCOPHAGE -XR) 500 MG 24 hr tablet Take  2 tablets  2 x / day with Meals for Diabetes                                                                 /  TAKE                                         BY                                                 MOUTH   mupirocin  ointment (BACTROBAN ) 2 % Apply 1 Application topically 2 (two) times daily. Apply to arm   predniSONE  (DELTASONE ) 20 MG tablet Take 2 tablets daily with breakfast.   rosuvastatin  (CRESTOR ) 20 MG tablet Take  1 tablet  Daily  for Cholesterol                                         /                       TAKE                                          BY                                             MOUTH                                                    ONE TIME DAILY   tadalafil  (CIALIS ) 20 MG tablet Take 1/2 to 1 tablet every 2 to 3 days as needed for XXXX tadalafil  (CIALIS ) 20 MG tablet 30 tablet 2 11/20/2021 Sent to pharmacy as: Tadalafil  20 MG Oral Tablet (Cialis ) E-Prescribing Status: Receipt confirmed by pharmacy ( [facility])  (11/20/2021 12:04 AM EDT)   VITAMIN D  PO Take 10,000 Units by mouth daily.    zinc  gluconate 50 MG tablet Take 50 mg by mouth daily.   phenol (CHLORASEPTIC) 1.4 % LIQD Use as directed 1 spray in the mouth or throat as needed for throat irritation / pain. (Patient not taking: Reported on 05/28/2023)   No current facility-administered medications on file prior to visit.    ROS: all negative except what is noted in the HPI.   Physical Exam:  BP 100/60   Pulse 91   Temp 97.9 F (36.6 C)   Ht 6' (1.829 m)   Wt 226 lb 6.4 oz (102.7 kg)   SpO2 99%   BMI 30.71 kg/m   General Appearance: NAD.  Awake, conversant and cooperative. Eyes: PERRLA, EOMs intact.  Sclera white.  Conjunctiva without erythema. Sinuses: No frontal/maxillary tenderness.  No nasal discharge. Nares patent.  ENT/Mouth: Ext aud canals clear.  Bilateral TMs w/DOL and without erythema or bulging. Hearing intact.  Posterior pharynx without swelling or exudate.  Tonsils without swelling or erythema.  Neck: Supple.  No masses, nodules or thyromegaly. Respiratory: Effort is regular with non-labored breathing. Breath sounds are equal bilaterally without  rales, rhonchi, wheezing or stridor.  Cardio: RRR with no MRGs. Brisk peripheral pulses without edema.  Abdomen: Active BS in all four quadrants.  Soft and non-tender without guarding, rebound tenderness, hernias or masses. Lymphatics: Non tender without lymphadenopathy.  Musculoskeletal: Full ROM, 5/5 strength, normal ambulation.  No clubbing or cyanosis. Skin: Appropriate color for ethnicity. Warm  without rashes, lesions, ecchymosis, ulcers.  Neuro: CN II-XII grossly normal. Normal muscle tone without cerebellar symptoms and intact sensation.   Psych: AO X 3,  appropriate mood and affect, insight and judgment.     BASCOM NECESSARY, NP 5:02 PM [facility]

## 2023-06-18 ENCOUNTER — Ambulatory Visit: Payer: 59 | Admitting: Internal Medicine

## 2023-07-02 ENCOUNTER — Ambulatory Visit
Admission: EM | Admit: 2023-07-02 | Discharge: 2023-07-02 | Disposition: A | Discharge status: Home / Self Care | Payer: 59 | Attending: Family Medicine | Admitting: Family Medicine

## 2023-07-02 DIAGNOSIS — R051 Acute cough: Secondary | ICD-10-CM | POA: Diagnosis not present

## 2023-07-02 DIAGNOSIS — R42 Dizziness and giddiness: Secondary | ICD-10-CM

## 2023-07-02 DIAGNOSIS — J101 Influenza due to other identified influenza virus with other respiratory manifestations: Secondary | ICD-10-CM

## 2023-07-02 LAB — POCT INFLUENZA A/B
Influenza A, POC: POSITIVE — AB
Influenza B, POC: NEGATIVE

## 2023-07-02 NOTE — ED Provider Notes (Signed)
[facility]    CSN: [id number] Arrival date & time: 07/02/23  1357      History   Chief Complaint Chief Complaint  Patient presents with   Sore Throat    HPI Wayne [name] is a 73 y.o. male  presents for evaluation of URI symptoms for 3 days. Patient reports associated symptoms of cough, congestion, sore throat, headache, nausea, fatigue. Denies vomiting or diarrhea, ear pain, body aches, shortness of breath. Patient does not have a hx of asthma. Patient is a previous smoker.   Reports sick contacts via work.  In addition he reports a couple months of intermittent lightheadedness.  Denies any chest pain, shortness of breath, syncope, palpitations, diaphoresis.  He does have a history of hypertension, type 2 diabetes, OSA, hyperlipidemia, GERD.  He has had no changes to his blood pressure medications which include atenolol, Hyzaar.  He does not check his blood pressure at home but states it normally runs low 100s to 115 over 60s.Marland Kitchen  His PCP recently passed away unexpectedly so he is in search of a new one.  Pt has taken DQ and tylenol. OTC for symptoms. Pt has no other concerns at this time.    Sore Throat    Past Medical History:  Diagnosis Date   Allergy    Anxiety    On Xanax   Chest pain    Fatigue    GERD (gastroesophageal reflux disease)    Heart murmur    hx of MVP    HTN (hypertension)    Hyperlipemia    IBS (irritable bowel syndrome)    Medication management 06/13/2013   Mitral valve prolapse    Obesity (BMI 30.0-34.9) 08/20/2015   OSA (obstructive sleep apnea)    Restated CPAP   Sleep apnea    no cpap    T2_NIDDM    Testosterone deficiency 08/20/2015   Type II or unspecified type diabetes mellitus without mention of complication, not stated as uncontrolled    Vitamin D deficiency     Patient Active Problem List   Diagnosis Date Noted   Chronic left-sided low back pain with left-sided sciatica 10/03/2019   CKD stage 2 due to type 2 diabetes mellitus  (HCC) 10/02/2019   CAD (coronary artery disease) 10/02/2019   Aortic atherosclerosis (HCC) by Chest CT scan 06.23.2020 10/02/2019   Lumbar spondylosis 09/17/2018   Spinal stenosis of lumbar region 09/17/2018   Former smoker 03/19/2018   Morbid obesity (HCC) - BMI 30+ with sleep apnea 08/20/2015   Testosterone deficiency 08/20/2015   Vitamin D deficiency 06/13/2013   Type 2 diabetes mellitus with stage 2 chronic kidney disease, without long-term current use of insulin (HCC)    OSA (obstructive sleep apnea)    IBS (irritable bowel syndrome)    HTN (hypertension)    Hyperlipidemia associated with type 2 diabetes mellitus (HCC)    GERD (gastroesophageal reflux disease)    Anxiety     Past Surgical History:  Procedure Laterality Date   COLONOSCOPY     TONSILLECTOMY     WISDOM TOOTH EXTRACTION         Home Medications    Prior to Admission medications   Medication Sig Start Date End Date Taking? Authorizing Provider  Acetaminophen (TYLENOL PO) Take by mouth.    [provider]  aspirin 81 MG tablet Take 81 mg by mouth daily.    [provider]  atenolol (TENORMIN) 100 MG tablet TAKE ONE TABLET BY MOUTH EVERY DAY FOR  BLOOD PRESSURE 11/09/22   Lucky Cowboy, MD  citalopram (CELEXA) 40 MG tablet TAKE ONE TABLET  DAILY FOR MOOD 11/09/22   Lucky Cowboy, MD  Continuous Blood Gluc Sensor (FREESTYLE LIBRE 14 DAY SENSOR) MISC USE TO CHECK BLOOD SUGARS THREE TIMES A DAY AS DIRECTED 03/22/21   Lucky Cowboy, MD  cyclobenzaprine (FLEXERIL) 5 MG tablet Take 1 tablet (5 mg total) by mouth at bedtime as needed. 05/18/23   Wallis Bamberg, PA-C  Dulaglutide (TRULICITY) 4.5 MG/0.5ML SOPN Inject  1 pen (4.5 mg)  into skin  every 7 days  for Diabetes  ( e11.29) 11/09/22   Lucky Cowboy, MD  glucose blood [facility] VERIO) test strip Check blood sugar 1 time a day-DX-E11.22. 03/22/21   Lucky Cowboy, MD  ibuprofen (ADVIL) 800 MG tablet Take 800 mg by mouth every 8 (eight) hours as  needed.    [provider]  losartan-hydrochlorothiazide (HYZAAR) 100-25 MG tablet Take  1 tablet Daily for BP                                                                                                   /                                                                   TAKE                                         BY                                                 MOUTH                ONE ? ? ? TIME ? ? ? DAILY 11/09/22   Lucky Cowboy, MD  Magnesium 400 MG CAPS Take 1 capsuile Daily Patient taking differently: 500 mg. Takes 2 capsules Daily 01/25/19   Lucky Cowboy, MD  metFORMIN (GLUCOPHAGE-XR) 500 MG 24 hr tablet Take  2 tablets  2 x / day with Meals for Diabetes                                                                 /  TAKE                                         BY                                                 MOUTH 01/07/23   Lucky Cowboy, MD  mupirocin ointment (BACTROBAN) 2 % Apply 1 Application topically 2 (two) times daily. Apply to arm 08/24/22   Ervin Knack E, FNP  phenol (CHLORASEPTIC) 1.4 % LIQD Use as directed 1 spray in the mouth or throat as needed for throat irritation / pain. Patient not taking: Reported on 05/28/2023 08/24/22   Gustavus Bryant, FNP  predniSONE (DELTASONE) 20 MG tablet Take 2 tablets daily with breakfast. 05/18/23   Wallis Bamberg, PA-C  rosuvastatin (CRESTOR) 20 MG tablet Take  1 tablet  Daily  for Cholesterol                                         /                       TAKE                                         BY                                             MOUTH                                                    ONE TIME DAILY 11/09/22   Lucky Cowboy, MD  tadalafil (CIALIS) 20 MG tablet Take 1/2 to 1 tablet every 2 to 3 days as needed for XXXX tadalafil (CIALIS) 20 MG tablet 30 tablet 2 11/20/2021 Sent to pharmacy as: Tadalafil 20 MG Oral Tablet (Cialis) E-Prescribing  Status: Receipt confirmed by pharmacy ( [facility])  (11/20/2021 12:04 AM EDT) 11/24/21   Lucky Cowboy, MD  VITAMIN D PO Take 10,000 Units by mouth daily.     [provider]  zinc gluconate 50 MG tablet Take 50 mg by mouth daily.    [provider]    Family History Family History  Problem Relation Age of Onset   Stroke Other    Hypertension Other    Hyperlipidemia Other    Diabetes Other    Colon cancer Father 70   Hypertension Father    Dementia Mother    Alzheimer's disease Mother    Colon polyps Neg Hx    Esophageal cancer Neg Hx    Rectal cancer Neg Hx    Stomach cancer Neg Hx     Social History Social History   Tobacco Use   Smoking status: Former    Current packs/day: 0.00  Average packs/day: 1 pack/day for 19.3 years (19.3 ttl pk-yrs)    Types: Cigarettes    Start date: 44    Quit date: 09/06/2007    Years since quitting: 15.8   Smokeless tobacco: Never  Vaping Use   Vaping status: Never Used  Substance Use Topics   Alcohol use: Yes    Comment: occasional   Drug use: No     Allergies   Patient has no known allergies.   Review of Systems Review of Systems  Constitutional:  Positive for fatigue.  HENT:  Positive for congestion and sore throat.   Respiratory:  Positive for cough.   Neurological:  Positive for light-headedness.     Physical Exam Triage Vital Signs ED Triage Vitals  Encounter Vitals Group     BP 07/02/23 1447 99/67     Systolic BP Percentile --      Diastolic BP Percentile --      Pulse Rate 07/02/23 1447 88     Resp 07/02/23 1447 18     Temp 07/02/23 1447 (!) 97.5 F (36.4 C)     Temp Source 07/02/23 1447 Oral     SpO2 07/02/23 1447 95 %     Weight --      Height --      Head Circumference --      Peak Flow --      Pain Score 07/02/23 1446 7     Pain Loc --      Pain Education --      Exclude from Growth Chart --    No data found.  Updated Vital Signs BP 99/67 (BP Location: Left Arm)   Pulse 88    Temp (!) 97.5 F (36.4 C) (Oral)   Resp 18   SpO2 95%   Visual Acuity Right Eye Distance:   Left Eye Distance:   Bilateral Distance:    Right Eye Near:   Left Eye Near:    Bilateral Near:     Physical Exam Vitals and nursing note reviewed.  Constitutional:      General: He is not in acute distress.    Appearance: Normal appearance. He is not ill-appearing or toxic-appearing.  HENT:     Head: Normocephalic and atraumatic.     Right Ear: Tympanic membrane and ear canal normal.     Left Ear: Tympanic membrane and ear canal normal.     Nose: Congestion present.     Mouth/Throat:     Mouth: Mucous membranes are moist.     Pharynx: No oropharyngeal exudate or posterior oropharyngeal erythema.  Eyes:     Pupils: Pupils are equal, round, and reactive to light.  Cardiovascular:     Rate and Rhythm: Normal rate and regular rhythm.     Heart sounds: Normal heart sounds. No murmur heard. Pulmonary:     Effort: Pulmonary effort is normal.     Breath sounds: Normal breath sounds.  Musculoskeletal:     Cervical back: Normal range of motion and neck supple.  Lymphadenopathy:     Cervical: No cervical adenopathy.  Skin:    General: Skin is warm and dry.  Neurological:     General: No focal deficit present.     Mental Status: He is alert and oriented to person, place, and time.  Psychiatric:        Mood and Affect: Mood normal.        Behavior: Behavior normal.      UC Treatments / Results  Labs (all labs ordered are listed, but only abnormal results are displayed) Labs Reviewed  POCT INFLUENZA A/B - Abnormal; Notable for the following components:      Result Value   Influenza A, POC Positive (*)    All other components within normal limits    EKG   Radiology No results found.  Procedures ED EKG  Date/Time: 07/02/2023 3:39 PM  Performed by: Radford Pax, NP Authorized by: Radford Pax, NP   ECG interpreted by ED Physician in the absence of a cardiologist: no    Interpretation:    Interpretation: normal   Rate:    ECG rate:  80   ECG rate assessment: normal   Rhythm:    Rhythm: sinus rhythm   Ectopy:    Ectopy: none   QRS:    QRS conduction: normal   ST segments:    ST segments:  Normal T waves:    T waves: normal   Q waves:    Abnormal Q-waves: not present    (including critical care time)  Medications Ordered in UC Medications - No data to display  Initial Impression / Assessment and Plan / UC Course  I have reviewed the triage vital signs and the nursing notes.  Pertinent labs & imaging results that were available during my care of the patient were reviewed by me and considered in my medical decision making (see chart for details).     Reviewed exam and symptoms with patient.  No red flags.  Positive influenza A.  Patient is on day 4 of symptoms, outside window for Tamiflu.  Discussed symptomatic treatment including fever reduction, fluids, rest, cough medicine.  Reviewed his concerns of lightheadedness.  EKG unremarkable.  Advised him to get a BP cuff and check his blood pressure twice daily and keep a log to take to his new PCP appointment, which nursing staff was able to help him establish.  Concentrate on fluids.  Did advise him to go to the ER for any worsening symptoms, red flags reviewed and patient verbalized understanding. Final Clinical Impressions(s) / UC Diagnoses   Final diagnoses:  Acute cough  Lightheadedness  Influenza A     Discharge Instructions      You have been diagnosed with influenza.  Please continue over-the-counter cough medicine and Tylenol.  Increase your fluids and rest.  Your EKG looked good today.  Please get a upper arm blood pressure cuff and take your blood pressure twice daily for the next 2 weeks.  Take this log to your new PCP appointment for further evaluation.  If you develop worsening symptoms of your lightheadedness which include but are not limited to chest pain, shortness of breath,  palpitations, losing consciousness or nearly losing consciousness please go to the ER for further evaluation.  I hope you feel better soon!     ED Prescriptions   None    PDMP not reviewed this encounter.   Radford Pax, NP 07/02/23 1539

## 2023-07-02 NOTE — ED Triage Notes (Signed)
Patient presents to UC for nausea, HA, cough, sore throat since Friday. Treating symptoms with dayquil and tylenol.

## 2023-07-02 NOTE — Discharge Instructions (Addendum)
You have been diagnosed with influenza.  Please continue over-the-counter cough medicine and Tylenol.  Increase your fluids and rest.  Your EKG looked good today.  Please get a upper arm blood pressure cuff and take your blood pressure twice daily for the next 2 weeks.  Take this log to your new PCP appointment for further evaluation.  If you develop worsening symptoms of your lightheadedness which include but are not limited to chest pain, shortness of breath, palpitations, losing consciousness or nearly losing consciousness please go to the ER for further evaluation.  I hope you feel better soon!

## 2023-08-07 ENCOUNTER — Ambulatory Visit: Payer: 59 | Admitting: Family Medicine

## 2023-08-07 ENCOUNTER — Encounter: Payer: Self-pay | Admitting: Family Medicine

## 2023-08-07 VITALS — BP 126/80 | HR 110 | Temp 97.5°F | Wt 221.4 lb

## 2023-08-07 DIAGNOSIS — E559 Vitamin D deficiency, unspecified: Secondary | ICD-10-CM

## 2023-08-07 DIAGNOSIS — E1169 Type 2 diabetes mellitus with other specified complication: Secondary | ICD-10-CM

## 2023-08-07 DIAGNOSIS — I700 Atherosclerosis of aorta: Secondary | ICD-10-CM

## 2023-08-07 DIAGNOSIS — I2510 Atherosclerotic heart disease of native coronary artery without angina pectoris: Secondary | ICD-10-CM

## 2023-08-07 DIAGNOSIS — E785 Hyperlipidemia, unspecified: Secondary | ICD-10-CM

## 2023-08-07 DIAGNOSIS — N182 Chronic kidney disease, stage 2 (mild): Secondary | ICD-10-CM | POA: Diagnosis not present

## 2023-08-07 DIAGNOSIS — E1122 Type 2 diabetes mellitus with diabetic chronic kidney disease: Secondary | ICD-10-CM | POA: Diagnosis not present

## 2023-08-07 DIAGNOSIS — M47816 Spondylosis without myelopathy or radiculopathy, lumbar region: Secondary | ICD-10-CM

## 2023-08-07 DIAGNOSIS — I10 Essential (primary) hypertension: Secondary | ICD-10-CM

## 2023-08-07 DIAGNOSIS — M5442 Lumbago with sciatica, left side: Secondary | ICD-10-CM

## 2023-08-07 DIAGNOSIS — Z9889 Other specified postprocedural states: Secondary | ICD-10-CM

## 2023-08-07 DIAGNOSIS — G8929 Other chronic pain: Secondary | ICD-10-CM

## 2023-08-07 LAB — POCT GLYCOSYLATED HEMOGLOBIN (HGB A1C): Hemoglobin A1C: 8.5 % — AB (ref 4.0–5.6)

## 2023-08-07 NOTE — Patient Instructions (Signed)
 **VISIT SUMMARY:**   Wayne [name] is a 73 year old male with a history of Type 2 Diabetes Mellitus, hypertension, coronary artery disease, hyperlipidemia, chronic back pain, depression, and vitamin D deficiency visited today for follow-up care. He is focused on managing his diabetes, blood pressure, and cholesterol levels. He also discussed his recent dental procedure and ongoing back pain. His conditions remain stable, and he is following his prescribed treatments.    ---  ### **YOUR PLAN:**    #### **TYPE 2 DIABETES MELLITUS:**   Type 2 diabetes is a condition where the body does not use insulin properly, leading to high blood sugar levels. Your recent A1c level has increased to 8.5%, likely due to inconsistent use of Trulicity. To manage this, you should **resume regular use of Trulicity (4.5 mg once weekly) along with Metformin (1000 mg twice daily)**.   - We will repeat lab work in one month, including **fasting blood sugar, kidney function, and cholesterol levels**.   - **Monitor your blood glucose at home** using the [facility] and keep a log of your readings.   - **Follow up in one month** to review lab results and home blood glucose logs. Medication adjustments may be needed based on these results.    #### **HYPERTENSION:**   Hypertension, or high blood pressure, is when the force of your blood against your artery walls is too high. Your blood pressure is currently **well-controlled at 126/80 mmHg**. Continue with your current antihypertensive medication regimen:   - **Atenolol 100 mg daily**   - **Losartan-hydrochlorothiazide 100/25 mg daily**    #### **CORONARY ARTERY DISEASE:**   Coronary artery disease occurs when the blood vessels supplying the heart are narrowed or blocked. Continue taking **aspirin 81 mg daily** to help prevent blood clots and support heart health.    #### **HYPERLIPIDEMIA:**   Hyperlipidemia is having high levels of fats (lipids) in the blood, which can  increase the risk of heart disease. Continue taking **rosuvastatin 20 mg daily** to manage cholesterol levels.    #### **CHRONIC BACK PAIN:**   Chronic back pain is persistent discomfort that can impact daily activities. Continue with **epidural injections as needed** and pace your activities to prevent worsening your symptoms.    #### **DEPRESSION AND ANXIETY:**   Depression and anxiety are mental health conditions that can affect mood and daily life. Continue taking **citalopram 40 mg daily** to manage symptoms and maintain emotional stability.    #### **DENTAL ISSUES:**   You recently had a root canal and are in the process of getting a dental implant. Continue taking **penicillin as prescribed** and follow up with your dentist as needed for your procedure.    #### **VITAMIN D DEFICIENCY:**   Vitamin D is important for bone health, and your levels are low. Continue taking **vitamin D3 supplements, 6000 IU daily**.    #### **GENERAL HEALTH MAINTENANCE:**   You received a flu shot but still contracted the flu, which highlights the importance of vaccinations. Continue to get your **annual flu vaccination** to protect against future infections.    ---  ### **INSTRUCTIONS:**   - **Resume regular use of Trulicity** along with Metformin.   - **Monitor your blood glucose at home** using the [facility] and keep a log of your readings.   - **Repeat lab work in one month**, including fasting blood sugar, kidney function, and cholesterol.   - **Schedule a follow-up appointment in one month** to review lab results and home blood glucose logs.   - **Inform your pharmacy**  of your new primary care provider for medication refills.   - **Follow up with your dentist** as needed for your dental implant procedure.    Let me know if you need any modifications!

## 2023-08-07 NOTE — Progress Notes (Signed)
 Assessment/Plan:   Assessment & Plan Type 2 Diabetes Mellitus Type 2 diabetes mellitus with recent increase in A1c. He is on metformin 1000 mg twice daily and Trulicity 4.5 mg once a week. He reported missing doses of Trulicity due to illness but plans to resume regular dosing. He understands the importance of maintaining regular dosing to manage blood glucose levels effectively. - Continue metformin 1000 mg twice daily - Resume Trulicity 4.5 mg once a week  Hypertension Hypertension managed with atenolol 100 mg daily and losartan-hydrochlorothiazide 100/25 mg. Blood pressure control is well-managed. He reports no significant symptoms such as chest pain or dyspnea, although stairs can be challenging. - Continue atenolol 100 mg daily - Continue losartan-hydrochlorothiazide 100/25 mg  Coronary Artery Disease Coronary artery disease with aortic atherosclerosis noted on a CT chest in 2020. He is on aspirin 81 mg daily. He reports no significant symptoms such as chest pain or dyspnea. - Continue aspirin 81 mg daily  Hyperlipidemia Hyperlipidemia managed with rosuvastatin 20 mg daily. He reports well-controlled cholesterol levels. - Continue rosuvastatin 20 mg daily  Chronic Back Pain secondary to Lumbar Stenosis Chronic back pain managed with epidural injections. No recent falls or injuries reported. He manages activities at his own pace to avoid exacerbating symptoms.  Depression and Anxiety Depression and anxiety managed with citalopram 40 mg daily. He reports stable mood with no thoughts of self-harm or harm to others. He has been on this medication for years with good effect. - Continue citalopram 40 mg daily  Dental Issues Recent dental procedure involving a root canal and ongoing process for a dental implant. He is currently on penicillin post-procedure. He is aware of the high costs associated with dental procedures and is proceeding with an implant.  Vitamin D  Deficiency Vitamin D deficiency managed with over-the-counter vitamin D3 supplementation. He takes 6000 IU daily. - Continue vitamin D3 6000 IU daily  General Health Maintenance He received a flu shot but still contracted the flu, highlighting the importance of vaccinations. - Encourage annual flu vaccination  Follow-up Discussion on medication refills and pharmacy coordination. He is advised to update the pharmacy with the new primary care provider's information for future refills. - Instruct him to inform pharmacy of new primary care provider for medication refills      Medications Discontinued During This Encounter  Medication Reason   predniSONE (DELTASONE) 20 MG tablet    cyclobenzaprine (FLEXERIL) 5 MG tablet    mupirocin ointment (BACTROBAN) 2 %     Return in about 1 month (around 09/07/2023) for fasting labs, DM, BP, HLD.    Subjective:   Encounter date: 08/07/2023  Wayne [name] is a 73 y.o. male who has HTN (hypertension); Hyperlipidemia associated with type 2 diabetes mellitus (HCC); GERD (gastroesophageal reflux disease); Anxiety; Type 2 diabetes mellitus with stage 2 chronic kidney disease, without long-term current use of insulin (HCC); OSA (obstructive sleep apnea); IBS (irritable bowel syndrome); Vitamin D deficiency; Morbid obesity (HCC) - BMI 30+ with sleep apnea; Testosterone deficiency; Former smoker; CKD stage 2 due to type 2 diabetes mellitus (HCC); CAD (coronary artery disease); Aortic atherosclerosis (HCC) by Chest CT scan 06.23.2020; Chronic left-sided low back pain with left-sided sciatica; Lumbar spondylosis; and Spinal stenosis of lumbar region on their problem list..   He  has a past medical history of Allergy, Anxiety, Chest pain, Fatigue, GERD (gastroesophageal reflux disease), Heart murmur, HTN (hypertension), Hyperlipemia, IBS (irritable bowel syndrome), Medication management (06/13/2013), Mitral valve prolapse, Obesity (BMI 30.0-34.9) (08/20/2015), OSA  (  obstructive sleep apnea), Sleep apnea, T2_NIDDM, Testosterone deficiency (08/20/2015), Type II or unspecified type diabetes mellitus without mention of complication, not stated as uncontrolled, and Vitamin D deficiency.Marland Kitchen   He presents with chief complaint of Establish Care (Due for colonoscopy. ) .   Discussed the use of AI scribe software for clinical note transcription with the patient, who gave verbal consent to proceed.  History of Present Illness Wayne [name] is a 73 year old male with a history of Type 2 Diabetes Mellitus, hypertension, coronary artery disease, hyperlipidemia, and anxiety/depression, presenting for establishing care after referral from Dr. Oneta Rack for continuation of care.  He is primarily concerned about managing his diabetes, blood pressure, and cholesterol levels.  Regarding diabetes, he reports increased thirst and urination. He describes a constant feeling of thirst and drinking large amounts of fluids, with urination occurring twice at night and three times during the day, though he is unsure if this frequency is excessive.  His A1c has risen from 7.0-7.3% in previous years to 8.5% recently. Previously, his A1c was stable in the 6-7% range. He attributes this increase to disruptions in routine (school schedules and illness), which affected his regular Trulicity use. His last recorded A1c was 7.2% in October of the previous year. He is currently on Trulicity and metformin and uses a [employer] for monitoring but needs to resume regular use.  Regarding hypertension, he is on atenolol 100 mg daily and losartan-hydrochlorothiazide 100/25 mg daily. He denies chest pain or significant shortness of breath, though he finds stairs challenging.  Regarding coronary artery disease, he takes aspirin 81 mg daily and has a history of aortic atherosclerosis noted on CT chest (2020).  For hyperlipidemia, he takes rosuvastatin 20 mg daily and states his cholesterol levels have  been stable.  Additional history includes:  Back issues, managed with epidural injections, no recent falls or injuries.  Recent dental procedure (root canal), currently on penicillin, in process of getting a dental implant, which he finds financially burdensome.  Anxiety and depression, managed with citalopram 40 mg daily, which he feels keeps him stable. Denies thoughts of self-harm.  Vitamin D deficiency, takes vitamin D3 6000 IU daily.  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       Past Surgical History:  Procedure Laterality Date   COLONOSCOPY     TONSILLECTOMY     WISDOM TOOTH EXTRACTION      Outpatient Medications Prior to Visit  Medication Sig Dispense Refill   Acetaminophen (TYLENOL PO) Take by mouth.     aspirin 81 MG tablet Take 81 mg by mouth daily.     atenolol (TENORMIN) 100 MG tablet TAKE ONE TABLET BY MOUTH EVERY DAY FOR BLOOD PRESSURE 90 tablet 0   citalopram (CELEXA) 40 MG tablet TAKE ONE TABLET  DAILY FOR MOOD 90 tablet 3   Continuous Blood Gluc Sensor (FREESTYLE LIBRE 14 DAY SENSOR) MISC USE TO CHECK BLOOD SUGARS THREE TIMES A DAY AS DIRECTED 3 each 3   Dulaglutide (TRULICITY) 4.5 MG/0.5ML SOPN Inject  1 pen (4.5 mg)  into skin  every 7 days  for Diabetes  ( e11.29) 6 mL 3   glucose blood (ONETOUCH VERIO) test strip Check blood sugar 1 time a day-DX-E11.22. 100 each 4   HYDROcodone-acetaminophen (NORCO/VICODIN) 5-325 MG tablet Take by mouth.     ibuprofen (ADVIL) 800 MG tablet Take 800 mg by mouth every 8 (eight) hours as needed.  losartan-hydrochlorothiazide (HYZAAR) 100-25 MG tablet Take  1 tablet Daily for BP                                                                                                   /                                                                   TAKE                                          BY                                                 MOUTH                ONE ? ? ? TIME ? ? ? DAILY 90 tablet 3   Magnesium 400 MG CAPS Take 1 capsuile Daily (Patient taking differently: 500 mg. Takes 2 capsules Daily)     metFORMIN (GLUCOPHAGE-XR) 500 MG 24 hr tablet Take  2 tablets  2 x / day with Meals for Diabetes                                                                 /                                                                   TAKE                                         BY                                                 MOUTH 360 tablet 3   penicillin v potassium (VEETID) 500 MG tablet Take 500 mg by mouth 4 (four) times daily.     phenol (CHLORASEPTIC) 1.4 % LIQD Use as directed 1 spray in the mouth or  throat as needed for throat irritation / pain. 118 mL 0   rosuvastatin (CRESTOR) 20 MG tablet Take  1 tablet  Daily  for Cholesterol                                         /                       TAKE                                         BY                                             MOUTH                                                    ONE TIME DAILY 90 tablet 3   tadalafil (CIALIS) 20 MG tablet Take 1/2 to 1 tablet every 2 to 3 days as needed for XXXX tadalafil (CIALIS) 20 MG tablet 30 tablet 2 11/20/2021 Sent to pharmacy as: Tadalafil 20 MG Oral Tablet (Cialis) E-Prescribing Status: Receipt confirmed by pharmacy ( [facility])  (11/20/2021 12:04 AM EDT) 30 tablet 2   VITAMIN D PO Take 6,000 Units by mouth daily.     zinc gluconate 50 MG tablet Take 50 mg by mouth daily.     cyclobenzaprine (FLEXERIL) 5 MG tablet Take 1 tablet (5 mg total) by mouth at bedtime as needed. (Patient not taking: Reported on 08/07/2023) 30 tablet 0   mupirocin ointment (BACTROBAN) 2 % Apply 1 Application topically 2 (two) times daily. Apply to arm (Patient not taking: Reported on 08/07/2023) 22 g 0   predniSONE (DELTASONE) 20 MG tablet Take 2 tablets daily with breakfast. (Patient not taking: Reported on  08/07/2023) 10 tablet 0   No facility-administered medications prior to visit.    Family History  Problem Relation Age of Onset   Stroke Other    Hypertension Other    Hyperlipidemia Other    Diabetes Other    Colon cancer Father 70   Hypertension Father    Dementia Mother    Alzheimer's disease Mother    Colon polyps Neg Hx    Esophageal cancer Neg Hx    Rectal cancer Neg Hx    Stomach cancer Neg Hx     Social History   Socioeconomic History   Marital status: Married    Spouse name: Not on file   Number of children: Not on file   Years of education: Not on file   Highest education level: Not on file  Occupational History   Not on file  Tobacco Use   Smoking status: Former    Current packs/day: 0.00    Average packs/day: 1 pack/day for 19.3 years (19.3 ttl pk-yrs)    Types: Cigarettes    Start date: 83    Quit date: 09/06/2007    Years since quitting: 15.9   Smokeless tobacco: Never  Vaping Use  Vaping status: Never Used  Substance and Sexual Activity   Alcohol use: Yes    Comment: occasional   Drug use: No   Sexual activity: Not on file  Other Topics Concern   Not on file  Social History Narrative   Not on file   Social Drivers of Health   Financial Resource Strain: Not on file  Food Insecurity: Not on file  Transportation Needs: Not on file  Physical Activity: Not on file  Stress: Not on file  Social Connections: Not on file  Intimate Partner Violence: Not on file                                                                                                  Objective:  Physical Exam: BP 126/80   Pulse (!) 110   Temp (!) 97.5 F (36.4 C) (Temporal)   Wt 221 lb 6.4 oz (100.4 kg)   SpO2 98%   BMI 30.03 kg/m      Physical Exam Constitutional:      Appearance: Normal appearance.  HENT:     Head: Normocephalic and atraumatic.     Right Ear: Hearing normal.     Left Ear: Hearing normal.     Nose: Nose normal.  Eyes:     General: No  scleral icterus.       Right eye: No discharge.        Left eye: No discharge.     Extraocular Movements: Extraocular movements intact.  Cardiovascular:     Rate and Rhythm: Normal rate and regular rhythm.     Heart sounds: Normal heart sounds.  Pulmonary:     Effort: Pulmonary effort is normal.     Breath sounds: Normal breath sounds.  Abdominal:     Palpations: Abdomen is soft.     Tenderness: There is no abdominal tenderness.  Skin:    General: Skin is warm.     Findings: No rash.  Neurological:     General: No focal deficit present.     Mental Status: He is alert.     Cranial Nerves: No cranial nerve deficit.  Psychiatric:        Mood and Affect: Mood normal.        Behavior: Behavior normal.        Thought Content: Thought content normal.        Judgment: Judgment normal.     DG Cervical Spine Complete Result Date: 05/18/2023 CLINICAL DATA:  Cervical radiculopathy EXAM: CERVICAL SPINE - COMPLETE 4+ VIEW COMPARISON:  None Available. FINDINGS: There is no evidence of cervical spine fracture or prevertebral soft tissue swelling. Alignment is normal. There is mild disc space narrowing and endplate osteophyte formation at C4-C5 and C5-C6. There is mild right-sided neural foraminal stenosis at C4-C5 and C5-C6 secondary to uncovertebral spurring IMPRESSION: Mild degenerative changes at C4-C5 and C5-C6. Electronically Signed   By: Darliss Cheney M.D.   On: 05/18/2023 16:53    Recent Results (from the past 2160 hours)  POCT Influenza A/B     Status: Abnormal   Collection Time: 07/02/23  3:06 PM  Result Value Ref Range   Influenza A, POC Positive (A) Negative   Influenza B, POC Negative Negative  POCT glycosylated hemoglobin (Hb A1C)     Status: Abnormal   Collection Time: 08/07/23  4:02 PM  Result Value Ref Range   Hemoglobin A1C 8.5 (A) 4.0 - 5.6 %   HbA1c POC (<> result, manual entry)     HbA1c, POC (prediabetic range)     HbA1c, POC (controlled diabetic range)           Garner Nash, MD, MS

## 2023-08-10 ENCOUNTER — Ambulatory Visit: Payer: 59 | Admitting: Nurse Practitioner

## 2023-08-23 ENCOUNTER — Ambulatory Visit: Payer: 59 | Admitting: Nurse Practitioner

## 2023-08-27 ENCOUNTER — Other Ambulatory Visit: Payer: Self-pay | Admitting: Family Medicine

## 2023-08-27 DIAGNOSIS — E1122 Type 2 diabetes mellitus with diabetic chronic kidney disease: Secondary | ICD-10-CM

## 2023-08-27 DIAGNOSIS — F419 Anxiety disorder, unspecified: Secondary | ICD-10-CM

## 2023-09-11 ENCOUNTER — Ambulatory Visit: Admitting: Family Medicine

## 2023-09-12 ENCOUNTER — Ambulatory Visit: Admitting: Family Medicine

## 2023-09-17 ENCOUNTER — Ambulatory Visit: Payer: PPO | Admitting: Nurse Practitioner

## 2023-10-10 ENCOUNTER — Ambulatory Visit: Admitting: Family Medicine

## 2023-11-01 ENCOUNTER — Ambulatory Visit: Admitting: Family Medicine

## 2023-11-15 ENCOUNTER — Other Ambulatory Visit: Payer: Self-pay | Admitting: Family Medicine

## 2023-11-15 DIAGNOSIS — F419 Anxiety disorder, unspecified: Secondary | ICD-10-CM

## 2023-11-19 ENCOUNTER — Telehealth: Payer: Self-pay | Admitting: Family Medicine

## 2023-11-19 ENCOUNTER — Encounter: Payer: 59 | Admitting: Internal Medicine

## 2023-11-19 NOTE — Telephone Encounter (Unsigned)
 Copied from CRM [id number]. Topic: Clinical - Prescription Issue >> Nov 19, 2023  5:53 PM Gennette ORN wrote: Reason for CRM: Patient has sent over a refill request by phone, but they are stating they can't fill it because the dr hasn't approved it these medications by the name of Dulaglutide  (TRULICITY ) 4.5 MG/0.5ML SOPN , losartan -hydrochlorothiazide (HYZAAR) 100-25 MG tablet , rosuvastatin  (CRESTOR ) 20 MG tablet.

## 2023-11-20 ENCOUNTER — Other Ambulatory Visit: Payer: Self-pay

## 2023-11-20 DIAGNOSIS — N182 Chronic kidney disease, stage 2 (mild): Secondary | ICD-10-CM

## 2023-11-20 DIAGNOSIS — I10 Essential (primary) hypertension: Secondary | ICD-10-CM

## 2023-11-20 DIAGNOSIS — E1169 Type 2 diabetes mellitus with other specified complication: Secondary | ICD-10-CM

## 2023-11-20 MED ORDER — LOSARTAN POTASSIUM-HCTZ 100-25 MG PO TABS: ORAL_TABLET | ORAL | 3 refills | Status: AC

## 2023-11-20 MED ORDER — TRULICITY 4.5 MG/0.5ML ~~LOC~~ SOAJ: SUBCUTANEOUS | 3 refills | Status: DC

## 2023-11-20 MED ORDER — ROSUVASTATIN CALCIUM 20 MG PO TABS: ORAL_TABLET | ORAL | 3 refills | Status: AC

## 2023-11-20 NOTE — Progress Notes (Signed)
 Spoke with patient to verify medications that needed refilling and pharmacy to send them to.

## 2023-11-22 ENCOUNTER — Encounter: Payer: 59 | Admitting: Internal Medicine

## 2023-11-28 ENCOUNTER — Telehealth: Payer: Self-pay | Admitting: Family Medicine

## 2023-11-28 ENCOUNTER — Other Ambulatory Visit: Payer: Self-pay | Admitting: Family Medicine

## 2023-11-28 DIAGNOSIS — E1122 Type 2 diabetes mellitus with diabetic chronic kidney disease: Secondary | ICD-10-CM

## 2023-11-28 NOTE — Telephone Encounter (Signed)
 10/10/2023 same day cancel 11/01/2023 same day cancel  Pt has rescheduled for 12/06/2023. Final warning sent via mail and mychart.

## 2023-11-29 ENCOUNTER — Telehealth: Payer: Self-pay

## 2023-11-29 DIAGNOSIS — E119 Type 2 diabetes mellitus without complications: Secondary | ICD-10-CM

## 2023-11-29 NOTE — Telephone Encounter (Signed)
 Requesting: Metformin  HCl 500mg  ER Last Visit: 08/07/2023 Next Visit: 12/06/2023 Last Refill: 01/07/2023 by Dr. Elsie Richards  Please Advise

## 2023-11-29 NOTE — Telephone Encounter (Signed)
 Requesting: FreeStyle Libre 14 Day Sensor Miscellaneous  Last Visit: 08/07/2023 Next Visit: 12/06/2023 Last Refill: 08/28/2023  Please Advise

## 2023-12-03 MED ORDER — METFORMIN HCL ER 500 MG PO TB24: ORAL_TABLET | ORAL | 3 refills | Status: AC

## 2023-12-03 NOTE — Telephone Encounter (Signed)
 Rx refill per Dr. Sebastian.

## 2023-12-06 ENCOUNTER — Ambulatory Visit: Admitting: Family Medicine

## 2023-12-06 ENCOUNTER — Telehealth: Payer: Self-pay

## 2023-12-06 ENCOUNTER — Encounter: Payer: Self-pay | Admitting: Family Medicine

## 2023-12-06 ENCOUNTER — Other Ambulatory Visit (HOSPITAL_COMMUNITY): Payer: Self-pay

## 2023-12-06 VITALS — BP 122/80 | HR 73 | Temp 97.7°F | Ht 72.0 in | Wt 221.8 lb

## 2023-12-06 DIAGNOSIS — Z0000 Encounter for general adult medical examination without abnormal findings: Secondary | ICD-10-CM

## 2023-12-06 DIAGNOSIS — N401 Enlarged prostate with lower urinary tract symptoms: Secondary | ICD-10-CM | POA: Diagnosis not present

## 2023-12-06 DIAGNOSIS — I700 Atherosclerosis of aorta: Secondary | ICD-10-CM | POA: Diagnosis not present

## 2023-12-06 DIAGNOSIS — Z23 Encounter for immunization: Secondary | ICD-10-CM

## 2023-12-06 DIAGNOSIS — I2510 Atherosclerotic heart disease of native coronary artery without angina pectoris: Secondary | ICD-10-CM

## 2023-12-06 DIAGNOSIS — K219 Gastro-esophageal reflux disease without esophagitis: Secondary | ICD-10-CM

## 2023-12-06 DIAGNOSIS — E785 Hyperlipidemia, unspecified: Secondary | ICD-10-CM

## 2023-12-06 DIAGNOSIS — E1122 Type 2 diabetes mellitus with diabetic chronic kidney disease: Secondary | ICD-10-CM

## 2023-12-06 DIAGNOSIS — N138 Other obstructive and reflux uropathy: Secondary | ICD-10-CM

## 2023-12-06 DIAGNOSIS — E1169 Type 2 diabetes mellitus with other specified complication: Secondary | ICD-10-CM | POA: Diagnosis not present

## 2023-12-06 DIAGNOSIS — Z1211 Encounter for screening for malignant neoplasm of colon: Secondary | ICD-10-CM

## 2023-12-06 DIAGNOSIS — E559 Vitamin D deficiency, unspecified: Secondary | ICD-10-CM

## 2023-12-06 DIAGNOSIS — K582 Mixed irritable bowel syndrome: Secondary | ICD-10-CM | POA: Diagnosis not present

## 2023-12-06 DIAGNOSIS — G4733 Obstructive sleep apnea (adult) (pediatric): Secondary | ICD-10-CM

## 2023-12-06 DIAGNOSIS — I10 Essential (primary) hypertension: Secondary | ICD-10-CM | POA: Diagnosis not present

## 2023-12-06 DIAGNOSIS — Z125 Encounter for screening for malignant neoplasm of prostate: Secondary | ICD-10-CM

## 2023-12-06 LAB — POCT GLYCOSYLATED HEMOGLOBIN (HGB A1C): Hemoglobin A1C: 7.2 % — AB (ref 4.0–5.6)

## 2023-12-06 LAB — MICROALBUMIN / CREATININE URINE RATIO
Creatinine,U: 164.6 mg/dL
Microalb Creat Ratio: 5.3 mg/g (ref 0.0–30.0)
Microalb, Ur: 0.9 mg/dL (ref 0.0–1.9)

## 2023-12-06 MED ORDER — TAMSULOSIN HCL 0.4 MG PO CAPS: 0.4000 mg | ORAL_CAPSULE | Freq: Every day | ORAL | 3 refills | Status: AC

## 2023-12-06 MED ORDER — TIRZEPATIDE 2.5 MG/0.5ML ~~LOC~~ SOAJ: 2.5000 mg | SUBCUTANEOUS | 0 refills | Status: AC

## 2023-12-06 NOTE — Telephone Encounter (Signed)
 Pharmacy Patient Advocate Encounter  Received notification from [facility] that Prior Authorization for Mounjaro  2.5MG /0.5ML auto-injectors has been APPROVED from 12/06/23 to 12/06/26   PA #/Case ID/Reference #: [id number] JN

## 2023-12-06 NOTE — Patient Instructions (Signed)
  VISIT SUMMARY: Today, we reviewed your diabetes management and other chronic conditions. Your A1c is currently 7.2%, and we discussed potential changes to your medication to better control your blood sugar. We also reviewed your coronary artery disease, hypertension, chronic kidney disease, hyperlipidemia, obstructive sleep apnea, benign prostatic hyperplasia, depression, anxiety, and erectile dysfunction. Additionally, we discussed general health maintenance, including the need for a colonoscopy, shingles vaccine, and vitamin D  level check.  YOUR PLAN: -TYPE 2 DIABETES MELLITUS WITH HYPERGLYCEMIA: Type 2 diabetes is a condition where your body does not use insulin  properly, leading to high blood sugar levels. Your A1c is 7.2%, and our goal is to reduce it to under 7% due to your heart disease. Continue taking Trulicity  4.5 mg weekly and metformin  1000 mg twice daily. We discussed possibly switching to Ozempic or Mounjaro  if your insurance covers it. We also recommend a low carb, low fat, high vegetable, low salt diet with whole grains, healthy fats, and lean proteins. We will order a diabetic foot exam and an annual diabetic retinopathy exam.  -CORONARY ARTERY DISEASE AND AORTIC ATHEROSCLEROSIS: Coronary artery disease and aortic atherosclerosis are conditions where your arteries are narrowed or blocked, affecting blood flow to your heart. Continue taking rosuvastatin  20 mg daily and aspirin 81 mg daily. We will order a lipid panel when you are fasting.  -HYPERTENSION: Hypertension is high blood pressure. Your blood pressure is currently 122/80 mmHg, which is at your goal. Continue taking losartan -hydrochlorothiazide 100 mg-25 mg daily and atenolol  100 mg.  -CHRONIC KIDNEY DISEASE STAGE 2 (CKD2): Chronic kidney disease stage 2 means your kidneys are not working as well as they should. We will order a comprehensive metabolic panel (CMP) to check your kidney function when you are  fasting.  -HYPERLIPIDEMIA: Hyperlipidemia is having high levels of fats in your blood. Continue taking rosuvastatin  20 mg daily. We will order a lipid panel when you are fasting.  -OBSTRUCTIVE SLEEP APNEA: Obstructive sleep apnea is a condition where your breathing stops and starts during sleep. We discussed the potential impact on your sleep quality and nocturia. Consider using a CPAP machine if your symptoms worsen and focus on weight management.  -BENIGN PROSTATIC HYPERPLASIA: Benign prostatic hyperplasia is an enlarged prostate gland that can cause urinary symptoms. You experience nocturia twice nightly. We will start you on tamsulosin  (Flomax ) once daily and order a PSA level and urinalysis.  -DEPRESSION AND ANXIETY: Depression and anxiety are mood disorders. Your mood is stable with citalopram  40 mg daily. Continue taking this medication.  -ERECTILE DYSFUNCTION: Erectile dysfunction is the inability to get or keep an erection. Continue taking tadalafil  20 mg as needed.  -GENERAL HEALTH MAINTENANCE: You are due for a colon cancer screening, shin gles vaccine, and vitamin D  level check. We will order a colonoscopy for colon cancer screening, administer the shingles vaccine, and check your vitamin D  levels.  INSTRUCTIONS: Please schedule a fasting lab appointment for a comprehensive metabolic panel (CMP), lipid panel, and PSA. Follow up on insurance coverage for Ozempic or Mounjaro  and check the cost of the shingles vaccine at the pharmacy. Use MyChart for communication and to view your results.

## 2023-12-06 NOTE — Telephone Encounter (Signed)
 Pharmacy Patient Advocate Encounter   Received notification from CoverMyMeds that prior authorization for Mounjaro  2.5MG /0.5ML auto-injectors is required/requested.   Insurance verification completed.   The patient is insured through [facility] .   Per test claim: PA required; PA submitted to above mentioned insurance via CoverMyMeds Key/confirmation #/EOC [id number] Status is pending

## 2023-12-06 NOTE — Progress Notes (Signed)
 Assessment  Assessment/Plan:  Assessment and Plan Assessment & Plan Type 2 Diabetes Mellitus with Hyperglycemia Type 2 diabetes with A1c of 7.2%, managed with Trulicity  4.5 mg weekly and metformin  1000 mg twice daily. Nausea possibly related to Trulicity . Goal is to reduce A1c to under 7% due to heart disease. Discussed potential switch to Ozempic or Mounjaro  for better glycemic control, weight loss, and sleep apnea improvement. Mounjaro  may have insurance coverage issues, but Ozempic tends to have better coverage. - Continue Trulicity  4.5 mg weekly and metformin  1000 mg twice daily - Consider switching to Ozempic 0.5 mg weekly or Mounjaro  2.5 mg weekly if insurance covers - Order diabetic foot exam - Order diabetic retinopathy annual exam - Recommend low carb, low fat, high vegetable, low salt diet with whole grains, healthy fats, lean proteins  Coronary Artery Disease and Aortic Atherosclerosis Coronary artery disease and aortic atherosclerosis managed with rosuvastatin  20 mg daily and aspirin 81 mg daily. No chest pain or shortness of breath. - Continue rosuvastatin  20 mg daily and aspirin 81 mg daily - Order lipid panel when fasting  Hypertension Hypertension managed with losartan -hydrochlorothiazide 100 mg-25 mg daily and atenolol  100 mg. Blood pressure is 122/80 mmHg, borderline at goal. - Continue losartan -hydrochlorothiazide 100 mg-25 mg daily and atenolol  100 mg  Chronic Kidney Disease Stage 2 (CKD2) CKD2. Unable to check renal function due to non-fasting state. - Order CMP to check renal function when fasting  Hyperlipidemia Hyperlipidemia managed with rosuvastatin  20 mg daily. Unable to check lipid levels due to non-fasting state. - Order lipid panel when fasting  Obstructive Sleep Apnea Obstructive sleep apnea. No current use of CPAP. Discussed potential impact on sleep quality and nocturia. Consider weight management and potential use of CPAP if symptoms worsen. -  Consider CPAP use if symptoms worsen - Discuss weight management as a potential intervention  Benign Prostatic Hyperplasia Nocturia twice nightly, likely related to benign prostatic hyperplasia. Discussed potential prostate-related urinary symptoms and management options. - Start tamsulosin  (Flomax ) once daily - Order PSA level - Order urinalysis  Depression and Anxiety Depression and anxiety managed with citalopram  40 mg daily. Mood is well-managed with no suicidal or homicidal ideation. - Continue citalopram  40 mg daily  Erectile Dysfunction Erectile dysfunction managed with tadalafil  20 mg PRN. - Continue tadalafil  20 mg PRN  General Health Maintenance Due for colon cancer screening, shingles vaccine, and vitamin D  level check. Colonoscopy preferred over Cologuard for direct visualization of polyps. - Order colonoscopy for colon cancer screening - Administer shingles vaccine - Order vitamin D  level check  Follow-up Follow-up needed for lab work and management of chronic conditions. Discussed insurance coverage for potential medication changes and cost of shingles vaccine. - Schedule fasting lab appointment for CMP, lipid panel, and PSA - Follow up on insurance coverage for Ozempic or Mounjaro  - Check cost of shingles vaccine at pharmacy - Use MyChart for communication and results     Medications Discontinued During This Encounter  Medication Reason   Dulaglutide  (TRULICITY ) 4.5 MG/0.5ML SOAJ     Patient Counseling(The following topics were reviewed and/or handout was given):  -Nutrition: Stressed importance of moderation in sodium/caffeine intake, saturated fat and cholesterol, caloric balance, sufficient intake of fresh fruits, vegetables, and fiber.  -Stressed the importance of regular exercise.   -Substance Abuse: Discussed cessation/primary prevention of tobacco, alcohol, or other drug use; driving or other dangerous activities under the influence; availability of  treatment for abuse.   -Injury prevention: Discussed safety belts, safety helmets,  smoke detector, smoking near bedding or upholstery.   -Sexuality: Discussed sexually transmitted diseases, partner selection, use of condoms, avoidance of unintended pregnancy and contraceptive alternatives.   -Dental health: Discussed importance of regular tooth brushing, flossing, and dental visits.  -Health maintenance and immunizations reviewed. Please refer to Health maintenance section.  Return in about 3 months (around 03/07/2024) for DM, BP, HLD.        Subjective:   Encounter date: 12/06/2023  Chief Complaint  Patient presents with   Diabetes    Follow p, concerns with morning nausea for months    Discussed the use of AI scribe software for clinical note transcription with the patient, who gave verbal consent to proceed.  History of Present Illness Wayne [name] is a 73 year old male with diabetes, coronary artery disease, and CKD2 who presents for diabetes follow-up and chronic condition management.  He has a history of diabetes with hyperglycemia and chronic kidney disease stage 2 (CKD2). His current A1c is 7.2. He is managed with Trulicity  4.5 mg weekly and metformin  1000 mg twice a day. He experiences mild nausea in the mornings, which he associates with Trulicity , as it was absent when he stopped the medication temporarily. The nausea is rated as a 3 out of 10 and improves after eating. No polyuria or polydipsia is reported.  He has a history of coronary artery disease and aortic atherosclerosis, managed with rosuvastatin  20 mg daily and aspirin 81 mg. No chest pain or shortness of breath. He also has hyperlipidemia and hypertension, for which he takes losartan -hydrochlorothiazide 100 mg-25 mg daily and atenolol  100 mg. His blood pressure is currently 122/80, which is at his goal of under 130/80.  He experiences urinary frequency, needing to urinate twice nightly, which he attributes to  his medications. He has a history of depression and anxiety, managed with citalopram  40 mg daily. His mood is stable, and he denies any suicidal or homicidal ideations.  He experiences fatigue on the golf course after nine to ten holes, which he attributes to the heat and humidity. No other significant symptoms such as blurry vision or excessive thirst.       08/07/2023    4:49 PM 11/11/2022   10:29 PM 08/10/2022   11:38 AM 04/03/2022   12:18 AM 12/30/2021   11:19 AM  Depression screen PHQ 2/9  Decreased Interest 1 0 0 0 0  Down, Depressed, Hopeless 1 0 0 0 0  PHQ - 2 Score 2 0 0 0 0  Altered sleeping 0      Tired, decreased energy 1      Change in appetite 0      Feeling bad or failure about yourself  0      Trouble concentrating 0      Moving slowly or fidgety/restless 0      Suicidal thoughts 0      PHQ-9 Score 3      Difficult doing work/chores Somewhat difficult           08/07/2023    4:49 PM  GAD 7 : Generalized Anxiety Score  Nervous, Anxious, on Edge 0  Control/stop worrying 0  Worry too much - different things 0  Trouble relaxing 0  Restless 0  Easily annoyed or irritable 0  Afraid - awful might happen 0  Total GAD 7 Score 0  Anxiety Difficulty Somewhat difficult    Health Maintenance Due  Topic Date Due   Zoster Vaccines- Shingrix (1 of 2) Never  done   OPHTHALMOLOGY EXAM  08/10/2021   Colonoscopy  03/30/2022   Medicare Annual Wellness (AWV)  08/10/2023   Diabetic kidney evaluation - Urine ACR  11/09/2023      PMH:  The following were reviewed and entered/updated in epic: Past Medical History:  Diagnosis Date   Allergy    Anxiety    On Xanax    Chest pain    Fatigue    GERD (gastroesophageal reflux disease)    Heart murmur    hx of MVP    HTN (hypertension)    Hyperlipemia    IBS (irritable bowel syndrome)    Medication management 06/13/2013   Mitral valve prolapse    Obesity (BMI 30.0-34.9) 08/20/2015   OSA (obstructive sleep apnea)     Restated CPAP   Sleep apnea    no cpap    T2_NIDDM    Testosterone  deficiency 08/20/2015   Type II or unspecified type diabetes mellitus without mention of complication, not stated as uncontrolled    Vitamin D  deficiency     Patient Active Problem List   Diagnosis Date Noted   Chronic left-sided low back pain with left-sided sciatica 10/03/2019   CKD stage 2 due to type 2 diabetes mellitus (HCC) 10/02/2019   CAD (coronary artery disease) 10/02/2019   Aortic atherosclerosis (HCC) by Chest CT scan 06.23.2020 10/02/2019   Lumbar spondylosis 09/17/2018   Spinal stenosis of lumbar region 09/17/2018   Former smoker 03/19/2018   Morbid obesity (HCC) - BMI 30+ with sleep apnea 08/20/2015   Testosterone  deficiency 08/20/2015   Vitamin D  deficiency 06/13/2013   Type 2 diabetes mellitus with stage 2 chronic kidney disease, without long-term current use of insulin  (HCC)    OSA (obstructive sleep apnea)    IBS (irritable bowel syndrome)    HTN (hypertension)    Hyperlipidemia associated with type 2 diabetes mellitus (HCC)    GERD (gastroesophageal reflux disease)    Anxiety     Past Surgical History:  Procedure Laterality Date   COLONOSCOPY     TONSILLECTOMY     WISDOM TOOTH EXTRACTION      Family History  Problem Relation Age of Onset   Stroke Other    Hypertension Other    Hyperlipidemia Other    Diabetes Other    Colon cancer Father 34   Hypertension Father    Dementia Mother    Alzheimer's disease Mother    Colon polyps Neg Hx    Esophageal cancer Neg Hx    Rectal cancer Neg Hx    Stomach cancer Neg Hx     Medications- reviewed and updated Outpatient Medications Prior to Visit  Medication Sig Dispense Refill   Acetaminophen  (TYLENOL  PO) Take by mouth.     aspirin 81 MG tablet Take 81 mg by mouth daily.     atenolol  (TENORMIN ) 100 MG tablet TAKE ONE TABLET BY MOUTH EVERY DAY FOR BLOOD PRESSURE 90 tablet 0   citalopram  (CELEXA ) 40 MG tablet TAKE ONE TABLET BY MOUTH ONCE  A DAY FOR MOOD 90 tablet 0   Continuous Glucose Sensor (FREESTYLE LIBRE 14 DAY SENSOR) MISC APPLY ONE SENSOR TOPICALLY TO SKIN AND CHECK THREE TIMES DAILY 3 each 1   glucose blood (ONETOUCH VERIO) test strip Check blood sugar 1 time a day-[id number]. 100 each 4   HYDROcodone -acetaminophen  (NORCO/VICODIN) 5-325 MG tablet Take by mouth.     ibuprofen (ADVIL) 800 MG tablet Take 800 mg by mouth every 8 (eight) hours as needed.  losartan -hydrochlorothiazide (HYZAAR) 100-25 MG tablet Take  1 tablet Daily for BP                                                                                                   /                                                                   TAKE                                         BY                                                 MOUTH                ONE ? ? ? TIME ? ? ? DAILY 90 tablet 3   Magnesium  400 MG CAPS Take 1 capsuile Daily (Patient taking differently: 500 mg. Takes 2 capsules Daily)     metFORMIN  (GLUCOPHAGE -XR) 500 MG 24 hr tablet Take  2 tablets  2 x / day with Meals for Diabetes                                                                 /                                                                   TAKE                                         BY                                                 MOUTH 360 tablet 3   penicillin v potassium (VEETID) 500 MG tablet Take 500 mg by mouth 4 (four) times daily.     phenol (CHLORASEPTIC) 1.4 % LIQD Use as directed 1 spray in the mouth or throat  as needed for throat irritation / pain. 118 mL 0   rosuvastatin  (CRESTOR ) 20 MG tablet Take  1 tablet  Daily  for Cholesterol                                         /                       TAKE                                         BY                                             MOUTH                                                    ONE TIME DAILY 90 tablet 3   tadalafil  (CIALIS ) 20 MG tablet Take 1/2 to 1 tablet every 2 to 3 days as needed for XXXX tadalafil   (CIALIS ) 20 MG tablet 30 tablet 2 11/20/2021 Sent to pharmacy as: Tadalafil  20 MG Oral Tablet (Cialis ) E-Prescribing Status: Receipt confirmed by pharmacy ( [facility])  (11/20/2021 12:04 AM EDT) 30 tablet 2   VITAMIN D  PO Take 6,000 Units by mouth daily.     zinc  gluconate 50 MG tablet Take 50 mg by mouth daily.     Dulaglutide  (TRULICITY ) 4.5 MG/0.5ML SOAJ Inject  1 pen (4.5 mg)  into skin  every 7 days  for Diabetes  ( e11.29) 6 mL 3   No facility-administered medications prior to visit.    No Known Allergies  Social History   Socioeconomic History   Marital status: Married    Spouse name: Not on file   Number of children: Not on file   Years of education: Not on file   Highest education level: Not on file  Occupational History   Not on file  Tobacco Use   Smoking status: Former    Current packs/day: 0.00    Average packs/day: 1 pack/day for 19.3 years (19.3 ttl pk-yrs)    Types: Cigarettes    Start date: 6    Quit date: 09/06/2007    Years since quitting: 16.2   Smokeless tobacco: Never  Vaping Use   Vaping status: Never Used  Substance and Sexual Activity   Alcohol use: Yes    Comment: occasional   Drug use: No   Sexual activity: Not on file  Other Topics Concern   Not on file  Social History Narrative   Not on file   Social Drivers of Health   Financial Resource Strain: Not on file  Food Insecurity: Not on file  Transportation Needs: Not on file  Physical Activity: Not on file  Stress: Not on file  Social Connections: Not on file           Objective:  Physical Exam: BP 122/80 (BP Location: Right Arm, Patient Position: Sitting, Cuff Size: Normal)   Pulse 73   Temp 97.7  F (36.5 C)   Ht 6' (1.829 m)   Wt 221 lb 12.8 oz (100.6 kg)   SpO2 97%   BMI 30.08 kg/m   Body mass index is 30.08 kg/m. Wt Readings from Last 3 Encounters:  12/06/23 221 lb 12.8 oz (100.6 kg)  08/07/23 221 lb 6.4 oz (100.4 kg)  05/28/23 226 lb 6.4 oz (102.7 kg)     Physical  Exam VITALS: BP- 122/80 GENERAL: Alert, cooperative, well developed, no acute distress. HEENT: Normocephalic, normal oropharynx, moist mucous membranes. CHEST: Clear to auscultation bilaterally, no wheezes, rhonchi, or crackles. CARDIOVASCULAR: Normal heart rate and rhythm, S1 and S2 normal without murmurs. ABDOMEN: Soft, non-tender, non-distended, without organomegaly, normal bowel sounds. EXTREMITIES: Feet in excellent condition, no cyanosis or edema. NEUROLOGICAL: Cranial nerves grossly intact, moves all extremities without gross motor or sensory deficit, sensation intact.  Physical Exam      Diabetic foot exam was performed with the following findings:   No deformities, ulcerations, or other skin breakdown Normal sensation of 10g monofilament Intact posterior tibialis and dorsalis pedis pulses     Prior labs:   Recent Results (from the past 2160 hours)  POCT glycosylated hemoglobin (Hb A1C)     Status: Abnormal   Collection Time: 12/06/23  9:04 AM  Result Value Ref Range   Hemoglobin A1C 7.2 (A) 4.0 - 5.6 %   HbA1c POC (<> result, manual entry)     HbA1c, POC (prediabetic range)     HbA1c, POC (controlled diabetic range)      Lab Results  Component Value Date   CHOL 121 03/15/2023   CHOL 129 11/09/2022   CHOL 130 08/10/2022   Lab Results  Component Value Date   HDL 39 (L) 03/15/2023   HDL 43 11/09/2022   HDL 38 (L) 08/10/2022   Lab Results  Component Value Date   LDLCALC 65 03/15/2023   LDLCALC 68 11/09/2022   LDLCALC 75 08/10/2022   Lab Results  Component Value Date   TRIG 85 03/15/2023   TRIG 99 11/09/2022   TRIG 87 08/10/2022   Lab Results  Component Value Date   CHOLHDL 3.1 03/15/2023   CHOLHDL 3.0 11/09/2022   CHOLHDL 3.4 08/10/2022   No results found for: LDLDIRECT  Last metabolic panel Lab Results  Component Value Date   GLUCOSE 88 03/15/2023   NA 139 03/15/2023   K 4.0 03/15/2023   CL 100 03/15/2023   CO2 32 03/15/2023   BUN 14  03/15/2023   CREATININE 0.87 03/15/2023   EGFR 93 03/15/2023   CALCIUM  10.2 03/15/2023   PROT 7.2 03/15/2023   ALBUMIN 4.1 10/16/2016   BILITOT 0.5 03/15/2023   ALKPHOS 57 10/16/2016   AST 24 03/15/2023   ALT 31 03/15/2023    Lab Results  Component Value Date   HGBA1C 7.2 (A) 12/06/2023    Last CBC Lab Results  Component Value Date   WBC 7.0 03/15/2023   HGB 12.6 (L) 03/15/2023   HCT 38.5 03/15/2023   MCV 92.8 03/15/2023   MCH 30.4 03/15/2023   RDW 12.0 03/15/2023   PLT 273 03/15/2023    Lab Results  Component Value Date   TSH 2.48 11/09/2022    Lab Results  Component Value Date   PSA 1.25 11/09/2022   PSA 1.13 06/06/2021   PSA 0.95 05/19/2020    Last vitamin D  Lab Results  Component Value Date   VD25OH 98 11/09/2022    Lab Results  Component Value Date  BILIRUBINUR NEGATIVE 03/01/2015   PROTEINUR NEGATIVE 11/09/2022   LEUKOCYTESUR NEGATIVE 11/09/2022    Lab Results  Component Value Date   MICROALBUR 0.3 11/09/2022   MICROALBUR 1.0 12/30/2021     At today's visit, we discussed treatment options, associated risk and benefits, and engage in counseling as needed.  Additionally the following were reviewed: Past medical records, past medical and surgical history, family and social background, as well as relevant laboratory results, imaging findings, and specialty notes, where applicable.  This message was generated using dictation software, and as a result, it may contain unintentional typos or errors.  Nevertheless, extensive effort was made to accurately convey at the pertinent aspects of the patient visit.    There may have been are other unrelated non-urgent complaints, but due to the busy schedule and the amount of time already spent with him, time does not permit to address these issues at today's visit. Another appointment may have or has been requested to review these additional issues.     Arvella Hummer, MD, MS

## 2023-12-07 ENCOUNTER — Ambulatory Visit: Payer: Self-pay | Admitting: Family Medicine

## 2023-12-07 LAB — URINALYSIS W MICROSCOPIC + REFLEX CULTURE
Bacteria, UA: NONE SEEN /HPF
Bilirubin Urine: NEGATIVE
Glucose, UA: NEGATIVE
Hgb urine dipstick: NEGATIVE
Hyaline Cast: NONE SEEN /LPF
Ketones, ur: NEGATIVE
Leukocyte Esterase: NEGATIVE
Nitrites, Initial: NEGATIVE
Protein, ur: NEGATIVE
Specific Gravity, Urine: 1.025 (ref 1.001–1.035)
Squamous Epithelial / HPF: NONE SEEN /HPF (ref <=5)
WBC, UA: NONE SEEN /HPF (ref 0–5)
pH: 6.5 (ref 5.0–8.0)

## 2023-12-07 LAB — NO CULTURE INDICATED

## 2023-12-10 ENCOUNTER — Other Ambulatory Visit (INDEPENDENT_AMBULATORY_CARE_PROVIDER_SITE_OTHER)

## 2023-12-10 DIAGNOSIS — G4733 Obstructive sleep apnea (adult) (pediatric): Secondary | ICD-10-CM

## 2023-12-10 DIAGNOSIS — Z125 Encounter for screening for malignant neoplasm of prostate: Secondary | ICD-10-CM

## 2023-12-10 DIAGNOSIS — N138 Other obstructive and reflux uropathy: Secondary | ICD-10-CM | POA: Diagnosis not present

## 2023-12-10 DIAGNOSIS — E785 Hyperlipidemia, unspecified: Secondary | ICD-10-CM

## 2023-12-10 DIAGNOSIS — E559 Vitamin D deficiency, unspecified: Secondary | ICD-10-CM

## 2023-12-10 DIAGNOSIS — I700 Atherosclerosis of aorta: Secondary | ICD-10-CM

## 2023-12-10 DIAGNOSIS — E1169 Type 2 diabetes mellitus with other specified complication: Secondary | ICD-10-CM

## 2023-12-10 DIAGNOSIS — K219 Gastro-esophageal reflux disease without esophagitis: Secondary | ICD-10-CM

## 2023-12-10 DIAGNOSIS — I10 Essential (primary) hypertension: Secondary | ICD-10-CM | POA: Diagnosis not present

## 2023-12-10 DIAGNOSIS — N401 Enlarged prostate with lower urinary tract symptoms: Secondary | ICD-10-CM

## 2023-12-10 DIAGNOSIS — N182 Chronic kidney disease, stage 2 (mild): Secondary | ICD-10-CM

## 2023-12-10 DIAGNOSIS — K582 Mixed irritable bowel syndrome: Secondary | ICD-10-CM

## 2023-12-10 DIAGNOSIS — E1122 Type 2 diabetes mellitus with diabetic chronic kidney disease: Secondary | ICD-10-CM

## 2023-12-10 DIAGNOSIS — I2510 Atherosclerotic heart disease of native coronary artery without angina pectoris: Secondary | ICD-10-CM

## 2023-12-10 LAB — COMPREHENSIVE METABOLIC PANEL WITH GFR
ALT: 15 U/L (ref 0–53)
AST: 19 U/L (ref 0–37)
Albumin: 4.3 g/dL (ref 3.5–5.2)
Alkaline Phosphatase: 60 U/L (ref 39–117)
BUN: 20 mg/dL (ref 6–23)
CO2: 32 meq/L (ref 19–32)
Calcium: 10 mg/dL (ref 8.4–10.5)
Chloride: 98 meq/L (ref 96–112)
Creatinine, Ser: 0.84 mg/dL (ref 0.40–1.50)
GFR: 87.7 mL/min (ref >60.00)
Glucose, Bld: 139 mg/dL — ABNORMAL HIGH (ref 70–99)
Potassium: 3.8 meq/L (ref 3.5–5.1)
Sodium: 137 meq/L (ref 135–145)
Total Bilirubin: 0.6 mg/dL (ref 0.2–1.2)
Total Protein: 7.2 g/dL (ref 6.0–8.3)

## 2023-12-10 LAB — CBC WITH DIFFERENTIAL/PLATELET
Basophils Absolute: 0.1 K/uL (ref 0.0–0.1)
Basophils Relative: 1 % (ref 0.0–3.0)
Eosinophils Absolute: 0.2 K/uL (ref 0.0–0.7)
Eosinophils Relative: 3.3 % (ref 0.0–5.0)
HCT: 40.1 % (ref 39.0–52.0)
Hemoglobin: 13.4 g/dL (ref 13.0–17.0)
Lymphocytes Relative: 50.4 % — ABNORMAL HIGH (ref 12.0–46.0)
Lymphs Abs: 3.5 K/uL (ref 0.7–4.0)
MCHC: 33.4 g/dL (ref 30.0–36.0)
MCV: 92.1 fl (ref 78.0–100.0)
Monocytes Absolute: 0.4 K/uL (ref 0.1–1.0)
Monocytes Relative: 6.4 % (ref 3.0–12.0)
Neutro Abs: 2.7 K/uL (ref 1.4–7.7)
Neutrophils Relative %: 38.9 % — ABNORMAL LOW (ref 43.0–77.0)
Platelets: 237 K/uL (ref 150.0–400.0)
RBC: 4.36 Mil/uL (ref 4.22–5.81)
RDW: 13.6 % (ref 11.5–15.5)
WBC: 6.9 K/uL (ref 4.0–10.5)

## 2023-12-10 LAB — LIPID PANEL
Cholesterol: 106 mg/dL (ref 0–200)
HDL: 35 mg/dL — ABNORMAL LOW (ref >39.00)
LDL Cholesterol: 56 mg/dL (ref 0–99)
NonHDL: 70.61
Total CHOL/HDL Ratio: 3
Triglycerides: 75 mg/dL (ref 0.0–149.0)
VLDL: 15 mg/dL (ref 0.0–40.0)

## 2023-12-11 LAB — PSA: PSA: 2.37 ng/mL (ref 0.10–4.00)

## 2023-12-11 LAB — TSH: TSH: 2.58 u[IU]/mL (ref 0.35–5.50)

## 2023-12-14 ENCOUNTER — Ambulatory Visit: Admitting: Family Medicine

## 2023-12-14 LAB — VITAMIN D 1,25 DIHYDROXY
Vitamin D 1, 25 (OH)2 Total: 39 pg/mL (ref 18–72)
Vitamin D2 1, 25 (OH)2: <8 pg/mL
Vitamin D3 1, 25 (OH)2: 39 pg/mL

## 2023-12-19 ENCOUNTER — Telehealth: Payer: Self-pay | Admitting: Family Medicine

## 2023-12-19 NOTE — Telephone Encounter (Signed)
 Copied from CRM [id number]. Topic: Referral - Status >> Dec 19, 2023  8:07 AM Henretta I wrote: Reason for CRM: [facility] was calling to inform that they did receive referrral for patient but they have not been able to get in contact with patient and have left multiple VM.

## 2023-12-21 ENCOUNTER — Other Ambulatory Visit: Payer: Self-pay | Admitting: Family Medicine

## 2023-12-21 DIAGNOSIS — I10 Essential (primary) hypertension: Secondary | ICD-10-CM

## 2023-12-24 ENCOUNTER — Encounter: Payer: PPO | Admitting: Internal Medicine

## 2024-01-02 NOTE — Telephone Encounter (Signed)
 Spoke with pt and relayed the message. Provide number to the 'referred to' office. Pt states he will call now.

## 2024-02-29 ENCOUNTER — Emergency Department (HOSPITAL_COMMUNITY)
Admission: EM | Admit: 2024-02-29 | Discharge: 2024-03-01 | Disposition: A | Discharge status: Home / Self Care | Attending: Emergency Medicine | Admitting: Emergency Medicine

## 2024-02-29 ENCOUNTER — Other Ambulatory Visit: Payer: Self-pay

## 2024-02-29 ENCOUNTER — Encounter (HOSPITAL_COMMUNITY): Payer: Self-pay

## 2024-02-29 DIAGNOSIS — Z7984 Long term (current) use of oral hypoglycemic drugs: Secondary | ICD-10-CM | POA: Insufficient documentation

## 2024-02-29 DIAGNOSIS — I2510 Atherosclerotic heart disease of native coronary artery without angina pectoris: Secondary | ICD-10-CM | POA: Insufficient documentation

## 2024-02-29 DIAGNOSIS — N189 Chronic kidney disease, unspecified: Secondary | ICD-10-CM | POA: Insufficient documentation

## 2024-02-29 DIAGNOSIS — I129 Hypertensive chronic kidney disease with stage 1 through stage 4 chronic kidney disease, or unspecified chronic kidney disease: Secondary | ICD-10-CM | POA: Insufficient documentation

## 2024-02-29 DIAGNOSIS — D631 Anemia in chronic kidney disease: Secondary | ICD-10-CM | POA: Diagnosis not present

## 2024-02-29 DIAGNOSIS — E1165 Type 2 diabetes mellitus with hyperglycemia: Secondary | ICD-10-CM | POA: Diagnosis not present

## 2024-02-29 DIAGNOSIS — Z7982 Long term (current) use of aspirin: Secondary | ICD-10-CM | POA: Diagnosis not present

## 2024-02-29 DIAGNOSIS — Z79899 Other long term (current) drug therapy: Secondary | ICD-10-CM | POA: Insufficient documentation

## 2024-02-29 DIAGNOSIS — R739 Hyperglycemia, unspecified: Secondary | ICD-10-CM

## 2024-02-29 LAB — CBG MONITORING, ED: Glucose-Capillary: 285 mg/dL — ABNORMAL HIGH (ref 70–99)

## 2024-02-29 NOTE — ED Triage Notes (Signed)
 Pt to ED from home with c/o hyperglycemia. Pt reports BG at home of 358. Has no additional complaints, states he takes metformin  for his diabetes. VSS, NADN.

## 2024-03-01 ENCOUNTER — Emergency Department (HOSPITAL_COMMUNITY)

## 2024-03-01 LAB — COMPREHENSIVE METABOLIC PANEL WITH GFR
ALT: 14 U/L (ref 0–44)
AST: 20 U/L (ref 15–41)
Albumin: 4.1 g/dL (ref 3.5–5.0)
Alkaline Phosphatase: 81 U/L (ref 38–126)
Anion gap: 8 (ref 5–15)
BUN: 13 mg/dL (ref 8–23)
CO2: 30 mmol/L (ref 22–32)
Calcium: 9.9 mg/dL (ref 8.9–10.3)
Chloride: 97 mmol/L — ABNORMAL LOW (ref 98–111)
Creatinine, Ser: 0.82 mg/dL (ref 0.61–1.24)
GFR, Estimated: >60 mL/min (ref >60)
Glucose, Bld: 253 mg/dL — ABNORMAL HIGH (ref 70–99)
Potassium: 4.1 mmol/L (ref 3.5–5.1)
Sodium: 135 mmol/L (ref 135–145)
Total Bilirubin: 0.4 mg/dL (ref 0.0–1.2)
Total Protein: 7.3 g/dL (ref 6.5–8.1)

## 2024-03-01 LAB — I-STAT CG4 LACTIC ACID, ED: Lactic Acid, Venous: 1.3 mmol/L (ref 0.5–1.9)

## 2024-03-01 LAB — I-STAT CHEM 8, ED
BUN: 13 mg/dL (ref 8–23)
Calcium, Ion: 1.22 mmol/L (ref 1.15–1.40)
Chloride: 95 mmol/L — ABNORMAL LOW (ref 98–111)
Creatinine, Ser: 1 mg/dL (ref 0.61–1.24)
Glucose, Bld: 254 mg/dL — ABNORMAL HIGH (ref 70–99)
HCT: 40 % (ref 39.0–52.0)
Hemoglobin: 13.6 g/dL (ref 13.0–17.0)
Potassium: 4.2 mmol/L (ref 3.5–5.1)
Sodium: 137 mmol/L (ref 135–145)
TCO2: 28 mmol/L (ref 22–32)

## 2024-03-01 LAB — CBC
HCT: 38.6 % — ABNORMAL LOW (ref 39.0–52.0)
Hemoglobin: 12.6 g/dL — ABNORMAL LOW (ref 13.0–17.0)
MCH: 30.7 pg (ref 26.0–34.0)
MCHC: 32.6 g/dL (ref 30.0–36.0)
MCV: 93.9 fL (ref 80.0–100.0)
Platelets: 250 K/uL (ref 150–400)
RBC: 4.11 MIL/uL — ABNORMAL LOW (ref 4.22–5.81)
RDW: 12.4 % (ref 11.5–15.5)
WBC: 8.7 K/uL (ref 4.0–10.5)
nRBC: 0 % (ref 0.0–0.2)

## 2024-03-01 LAB — URINALYSIS, ROUTINE W REFLEX MICROSCOPIC
Bilirubin Urine: NEGATIVE
Glucose, UA: >=500 mg/dL — AB
Hgb urine dipstick: NEGATIVE
Ketones, ur: NEGATIVE mg/dL
Leukocytes,Ua: NEGATIVE
Nitrite: NEGATIVE
Protein, ur: NEGATIVE mg/dL
Specific Gravity, Urine: 1.021 (ref 1.005–1.030)
pH: 5 (ref 5.0–8.0)

## 2024-03-01 LAB — CBG MONITORING, ED: Glucose-Capillary: 186 mg/dL — ABNORMAL HIGH (ref 70–99)

## 2024-03-01 MED ORDER — KETOROLAC TROMETHAMINE 15 MG/ML IJ SOLN
15.0000 mg | Freq: Once | INTRAMUSCULAR | Status: AC
  Administered 2024-03-01: 15 mg via INTRAMUSCULAR
  Filled 2024-03-01: qty 1

## 2024-03-01 NOTE — Discharge Instructions (Signed)
 Today you were seen for hyperglycemia, your workup on the emergency department was reassuring.  Please continue taking your at home medications and follow-up with your primary care as soon as possible for further evaluation or workup.  Please return to the ED if you have uncontrollable nausea vomiting, fever that does not go down with Tylenol  or Motrin, or worsening symptoms.  Thank you for letting us  treat you today. After reviewing your labs, I feel you are safe to go home. Please follow up with your PCP in the next several days and provide them with your records from this visit. Return to the Emergency Room if pain becomes severe or symptoms worsen.

## 2024-03-01 NOTE — ED Provider Notes (Signed)
 [place] EMERGENCY DEPARTMENT AT [facility] Provider Note   CSN: [id number] Arrival date & time: 02/29/24  2218     Patient presents with: Hyperglycemia   Wayne [name] is a 73 y.o. male.  Past medical history significant for CKD, CAD, diabetes, and hypertension presents today for hyperglycemia.  Patient reports blood glucose at home of 358.  Patient denies polyuria, polydipsia, abdominal pain, nausea, vomiting, chest pain, shortness of breath, any other complaints at this time.  Patient reports compliance with his metformin .  Patient also reports syncopal episode in which he was sitting and felt lightheaded, stood up abruptly and passed out and hit his head.  Patient denies chest pain or shortness of breath at that time.  Patient denies blood thinner use or any other injuries.    Hyperglycemia      Prior to Admission medications   Medication Sig Start Date End Date Taking? Authorizing Provider  Acetaminophen  (TYLENOL  PO) Take by mouth.    [provider]  aspirin 81 MG tablet Take 81 mg by mouth daily.    [provider]  atenolol  (TENORMIN ) 100 MG tablet TAKE ONE TABLET BY MOUTH EVERY DAY FOR BLOOD PRESSURE 12/21/23   Sebastian Beverley NOVAK, MD  citalopram  (CELEXA ) 40 MG tablet TAKE ONE TABLET BY MOUTH ONCE A DAY FOR MOOD 11/15/23   Sebastian Beverley NOVAK, MD  Continuous Glucose Sensor (FREESTYLE LIBRE 14 DAY SENSOR) MISC APPLY ONE SENSOR TOPICALLY TO SKIN AND CHECK THREE TIMES DAILY 11/29/23   Sebastian Beverley NOVAK, MD  glucose blood (ONETOUCH VERIO) test strip Check blood sugar 1 time a day-DX-E11.22. 03/22/21   Tonita Fallow, MD  HYDROcodone -acetaminophen  (NORCO/VICODIN) 5-325 MG tablet Take by mouth. 08/03/23   [provider]  ibuprofen (ADVIL) 800 MG tablet Take 800 mg by mouth every 8 (eight) hours as needed.    [provider]  losartan -hydrochlorothiazide (HYZAAR) 100-25 MG tablet Take  1 tablet Daily for BP                                                                                                    /                                                                   TAKE                                         BY                                                 MOUTH  ONE ? ? ? TIME ? ? ? DAILY 11/20/23   Sebastian Beverley NOVAK, MD  Magnesium  400 MG CAPS Take 1 capsuile Daily Patient taking differently: 500 mg. Takes 2 capsules Daily 01/25/19   Tonita Fallow, MD  metFORMIN  (GLUCOPHAGE -XR) 500 MG 24 hr tablet Take  2 tablets  2 x / day with Meals for Diabetes                                                                 /                                                                   TAKE                                         BY                                                 MOUTH 12/03/23   Sebastian Beverley NOVAK, MD  penicillin v potassium (VEETID) 500 MG tablet Take 500 mg by mouth 4 (four) times daily. 08/03/23   [provider]  phenol (CHLORASEPTIC) 1.4 % LIQD Use as directed 1 spray in the mouth or throat as needed for throat irritation / pain. 08/24/22   Hazen Darryle BRAVO, FNP  rosuvastatin  (CRESTOR ) 20 MG tablet Take  1 tablet  Daily  for Cholesterol                                         /                       TAKE                                         BY                                             MOUTH                                                    ONE TIME DAILY 11/20/23   Sebastian Beverley NOVAK, MD  tadalafil  (CIALIS ) 20 MG tablet Take 1/2 to 1 tablet every 2 to 3 days as needed for XXXX tadalafil  (CIALIS ) 20 MG tablet 30 tablet 2 11/20/2021 Sent to  pharmacy as: Tadalafil  20 MG Oral Tablet (Cialis ) E-Prescribing Status: Receipt confirmed by pharmacy ( [facility])  (11/20/2021 12:04 AM EDT) 11/24/21   Tonita Fallow, MD  tamsulosin  (FLOMAX ) 0.4 MG CAPS capsule Take 1 capsule (0.4 mg total) by mouth daily. 12/06/23   Sebastian Beverley NOVAK, MD  VITAMIN D  PO Take 6,000 Units by mouth daily.    [provider]  zinc   gluconate 50 MG tablet Take 50 mg by mouth daily.    [provider]    Allergies: Patient has no known allergies.    Review of Systems  Updated Vital Signs BP (!) 127/53   Pulse (!) 55   Temp 98.2 F (36.8 C) (Oral)   Resp 17   Ht 6' (1.829 m)   Wt 100 kg   SpO2 97%   BMI 29.90 kg/m   Physical Exam Vitals and nursing note reviewed.  Constitutional:      General: He is not in acute distress.    Appearance: He is well-developed.  HENT:     Head: Normocephalic and atraumatic.  Eyes:     Conjunctiva/sclera: Conjunctivae normal.  Cardiovascular:     Rate and Rhythm: Normal rate and regular rhythm.     Heart sounds: No murmur heard. Pulmonary:     Effort: Pulmonary effort is normal. No respiratory distress.     Breath sounds: Normal breath sounds.  Abdominal:     Palpations: Abdomen is soft.     Tenderness: There is no abdominal tenderness.  Musculoskeletal:        General: No swelling.     Cervical back: Neck supple.  Skin:    General: Skin is warm and dry.     Capillary Refill: Capillary refill takes less than 2 seconds.  Neurological:     Mental Status: He is alert.  Psychiatric:        Mood and Affect: Mood normal.     (all labs ordered are listed, but only abnormal results are displayed) Labs Reviewed  CBC - Abnormal; Notable for the following components:      Result Value   RBC 4.11 (*)    Hemoglobin 12.6 (*)    HCT 38.6 (*)    All other components within normal limits  URINALYSIS, ROUTINE W REFLEX MICROSCOPIC - Abnormal; Notable for the following components:   Glucose, UA >=500 (*)    Bacteria, UA RARE (*)    All other components within normal limits  COMPREHENSIVE METABOLIC PANEL WITH GFR - Abnormal; Notable for the following components:   Chloride 97 (*)    Glucose, Bld 253 (*)    All other components within normal limits  CBG MONITORING, ED - Abnormal; Notable for the following components:   Glucose-Capillary 285 (*)    All other  components within normal limits  CBG MONITORING, ED - Abnormal; Notable for the following components:   Glucose-Capillary 186 (*)    All other components within normal limits  I-STAT CHEM 8, ED - Abnormal; Notable for the following components:   Chloride 95 (*)    Glucose, Bld 254 (*)    All other components within normal limits  CBG MONITORING, ED  CBG MONITORING, ED    EKG: None  Radiology: CT Head Wo Contrast Result Date: 03/01/2024 EXAM: CT HEAD WITHOUT CONTRAST 03/01/2024 05:12:13 AM TECHNIQUE: CT of the head was performed without the administration of intravenous contrast. Automated exposure control, iterative reconstruction, and/or weight based adjustment of the mA/kV was utilized to reduce the radiation  dose to as low as reasonably achievable. COMPARISON: None available. CLINICAL HISTORY: Head trauma, minor (Age >= 65y). FINDINGS: BRAIN AND VENTRICLES: No acute hemorrhage. No evidence of acute infarct. No hydrocephalus. No extra-axial collection. No mass effect or midline shift. Typically senescent bilateral basal gangliar mineralization. Atherosclerosis of skullbase vasculature without hyperdense vessel or abnormal calcification. ORBITS: No acute abnormality. SINUSES: No acute abnormality. SOFT TISSUES AND SKULL: No acute soft tissue abnormality. No skull fracture. IMPRESSION: 1. No acute intracranial abnormality related to head trauma. Electronically signed by: Evalene Coho MD 03/01/2024 05:13 AM EDT RP Workstation: [id number]     Procedures   Medications Ordered in the ED  ketorolac (TORADOL) 15 MG/ML injection 15 mg (15 mg Intramuscular Given 03/01/24 0526)                                    Medical Decision Making Amount and/or Complexity of Data Reviewed Labs: ordered.   This patient presents to the ED for concern of hyperglycemia differential diagnosis includes hyperglycemia, DKA, HHS   Lab Tests:  I Ordered, and personally interpreted labs.  The pertinent  results include: Decreased chloride at 97, elevated glucose at 253, mild anemia at 12.6, with greater than 500 glucose, and rare bacteria   CT head Noncon which showed no acute intracranial abnormality   Medicines ordered and prescription drug management:  I ordered medication including for Toradol I have reviewed the patients home medicines and have made adjustments as needed   Problem List / ED Course:  repeat CBG 186 Considered for admission or further workup however patient's vital signs, physical exam, and labs are reassuring.  Patient does not appear to be in DKA or HHS at this time.  Patient advised to follow-up with his primary care for further evaluation workup.  Patient given return precautions.  I feel patient safe for discharge at this time.     Final diagnoses:  Hyperglycemia    ED Discharge Orders     None          Francis Ileana LOISE DEVONNA 03/01/24 0529    Trine Raynell Moder, MD 03/01/24 [phone]

## 2024-03-01 NOTE — ED Notes (Signed)
 Patient d/c with home care instructions. VS obtained.

## 2024-03-03 ENCOUNTER — Other Ambulatory Visit: Payer: Self-pay | Admitting: Family Medicine

## 2024-03-03 DIAGNOSIS — F419 Anxiety disorder, unspecified: Secondary | ICD-10-CM

## 2024-03-04 ENCOUNTER — Other Ambulatory Visit: Payer: Self-pay

## 2024-03-04 DIAGNOSIS — E1122 Type 2 diabetes mellitus with diabetic chronic kidney disease: Secondary | ICD-10-CM

## 2024-03-04 MED ORDER — FREESTYLE LIBRE 3 PLUS SENSOR MISC: 3 refills | Status: DC

## 2024-03-04 NOTE — Telephone Encounter (Signed)
 Copied from CRM #[id number]. Topic: Clinical - Medication Question >> Mar 04, 2024  8:41 AM Thersia BROCKS wrote: Reason for RMF:[facility] called in regarding Continuous Glucose Sensor (FREESTYLE LIBRE 14 DAY SENSOR) MISC  needs to be 3 plus sensor no longer make 14 day   [phone]

## 2024-03-04 NOTE — Telephone Encounter (Signed)
 Forwarding message below. Please advise. I am unsure on how to order the correct Rx.

## 2024-03-04 NOTE — Addendum Note (Signed)
 Addended by: SEBASTIAN RIGHTER B on: 03/04/2024 02:47 PM   Modules accepted: Orders

## 2024-03-05 ENCOUNTER — Encounter: Payer: Self-pay | Admitting: Family Medicine

## 2024-03-06 LAB — HM DIABETES EYE EXAM

## 2024-03-07 ENCOUNTER — Ambulatory Visit: Admitting: Family Medicine

## 2024-03-07 VITALS — BP 152/78 | HR 67 | Temp 95.3°F | Ht 72.0 in | Wt 235.6 lb

## 2024-03-07 DIAGNOSIS — N182 Chronic kidney disease, stage 2 (mild): Secondary | ICD-10-CM | POA: Diagnosis not present

## 2024-03-07 DIAGNOSIS — I10 Essential (primary) hypertension: Secondary | ICD-10-CM | POA: Diagnosis not present

## 2024-03-07 DIAGNOSIS — E1122 Type 2 diabetes mellitus with diabetic chronic kidney disease: Secondary | ICD-10-CM | POA: Diagnosis not present

## 2024-03-07 DIAGNOSIS — Z23 Encounter for immunization: Secondary | ICD-10-CM

## 2024-03-07 DIAGNOSIS — Z1211 Encounter for screening for malignant neoplasm of colon: Secondary | ICD-10-CM

## 2024-03-07 DIAGNOSIS — Z7984 Long term (current) use of oral hypoglycemic drugs: Secondary | ICD-10-CM

## 2024-03-07 DIAGNOSIS — Z1212 Encounter for screening for malignant neoplasm of rectum: Secondary | ICD-10-CM

## 2024-03-07 DIAGNOSIS — Z7985 Long-term (current) use of injectable non-insulin antidiabetic drugs: Secondary | ICD-10-CM

## 2024-03-07 LAB — POCT GLYCOSYLATED HEMOGLOBIN (HGB A1C): Hemoglobin A1C: 8.5 % — AB (ref 4.0–5.6)

## 2024-03-07 NOTE — Progress Notes (Signed)
 Assessment  Assessment/Plan:  Assessment and Plan Assessment & Plan Type 2 diabetes mellitus, uncontrolled A1c is above goal at 8.5%. Recent blood sugar spike to 400 mg/dL, requiring emergency observation. Currently using Mounjaro  2.5 mg weekly, recently switched from Trulicity  due to GI side effects. No retinopathy detected on recent eye exam. Mounjaro  is expected to provide better glycemic control with fewer side effects compared to Trulicity . - Continue metformin  1000 mg twice daily - Continue Mounjaro  2.5 mg weekly - Monitor blood sugar levels - Increase Mounjaro  to 5 mg if blood sugar remains above 200 mg/dL - Follow up in one month  Hypertension Blood pressure is elevated at 152/78 mmHg. Current medications include losartan , hydrochlorothiazide, and atenolol . Focus on diabetes control may help reduce blood pressure. - Continue losartan , hydrochlorothiazide, and atenolol  - Monitor blood pressure  Chronic kidney disease stage 2 Kidney function is stable. No proteinuria detected. - Check kidney function tests  Hyperlipidemia Cholesterol levels are well-controlled with rosuvastatin . LDL was 56 mg/dL in July. - Continue rosuvastatin  20 mg daily  Aortic atherosclerosis Known condition, currently managed with aspirin and statin therapy. No new findings on recent CT scan. - Continue aspirin 81 mg daily  Major depressive disorder and anxiety disorder Currently managed with citalopram  40 mg daily. - Continue citalopram  40 mg daily  General Health Maintenance Encouraged to engage in regular physical activity, particularly walking, given the current favorable weather conditions. - Encourage regular physical activity, such as walking     Medications Discontinued During This Encounter  Medication Reason   penicillin v potassium (VEETID) 500 MG tablet    phenol (CHLORASEPTIC) 1.4 % LIQD    HYDROcodone -acetaminophen  (NORCO/VICODIN) 5-325 MG tablet    glucose blood (ONETOUCH  VERIO) test strip     Return in about 1 month (around 04/07/2024) for BP, HLD, DM.        Subjective:   Encounter date: 03/07/2024  Chief Complaint  Patient presents with   Medical Management of Chronic Issues    Pt presents for 3mon f/u. Pt had a ED visit for high blood sugar. Pt has been fasting      Discussed the use of AI scribe software for clinical note transcription with the patient, who gave verbal consent to proceed.  History of Present Illness 12/06/2023  Wayne [name] is a 72 year old male with diabetes, coronary artery disease, and CKD2 who presents for diabetes follow-up and chronic condition management.  He has a history of diabetes with hyperglycemia and chronic kidney disease stage 2 (CKD2). His current A1c is 7.2. He is managed with Trulicity  4.5 mg weekly and metformin  1000 mg twice a day. He experiences mild nausea in the mornings, which he associates with Trulicity , as it was absent when he stopped the medication temporarily. The nausea is rated as a 3 out of 10 and improves after eating. No polyuria or polydipsia is reported.  He has a history of coronary artery disease and aortic atherosclerosis, managed with rosuvastatin  20 mg daily and aspirin 81 mg. No chest pain or shortness of breath. He also has hyperlipidemia and hypertension, for which he takes losartan -hydrochlorothiazide 100 mg-25 mg daily and atenolol  100 mg. His blood pressure is currently 122/80, which is at his goal of under 130/80.  He experiences urinary frequency, needing to urinate twice nightly, which he attributes to his medications. He has a history of depression and anxiety, managed with citalopram  40 mg daily. His mood is stable, and he denies any suicidal or homicidal  ideations.  He experiences fatigue on the golf course after nine to ten holes, which he attributes to the heat and humidity. No other significant symptoms such as blurry vision or excessive thirst.  03/07/24  Wayne [name] is a 73 year old male with diabetes who presents for follow-up on diabetes management.  Glycemic control and diabetes management - A1c currently elevated at 8.5%, above target goal - Recent episode of hyperglycemia with blood glucose spiking to 400 mg/dL, resulting in a 89-ynlm observation at [facility] - Received ketorolac during observation for headache - Recently transitioned from Trulicity  to Mounjaro  2.5 mg weekly, started two days ago - No adverse reactions to Mounjaro  thus far - Previously experienced nausea with higher doses of Trulicity , leading to hesitancy in switching medications - Utilizes Libre device for continuous glucose monitoring  Hypertension and cardiovascular risk factors - Hypertension managed with losartan  100 mg and hydrochlorothiazide 25 mg daily, and atenolol  100 mg daily - Recent increase in stress attributed to job change and personal issues, perceived as contributing to blood pressure control challenges  Hyperlipidemia and atherosclerotic disease - Hyperlipidemia managed with rosuvastatin  20 mg daily - Aortic atherosclerosis managed with aspirin 81 mg daily - Cholesterol levels last checked in July, within acceptable ranges  Mood and psychosocial stressors - Major depressive disorder treated with citalopram  40 mg daily - Persistent feelings of being overwhelmed by personal and professional responsibilities - Recent rapid job transition and personal matters contributing to increased stress and perceived impact on health management  Diabetic retinopathy screening - Recent ophthalmologic evaluation by Dr. Elner with no evidence of diabetic retinopathy       08/07/2023    4:49 PM 11/11/2022   10:29 PM 08/10/2022   11:38 AM 04/03/2022   12:18 AM 12/30/2021   11:19 AM  Depression screen PHQ 2/9  Decreased Interest 1 0 0 0 0  Down, Depressed, Hopeless 1 0 0 0 0  PHQ - 2 Score 2 0 0 0 0  Altered sleeping 0      Tired, decreased energy 1       Change in appetite 0      Feeling bad or failure about yourself  0      Trouble concentrating 0      Moving slowly or fidgety/restless 0      Suicidal thoughts 0      PHQ-9 Score 3      Difficult doing work/chores Somewhat difficult           08/07/2023    4:49 PM  GAD 7 : Generalized Anxiety Score  Nervous, Anxious, on Edge 0  Control/stop worrying 0  Worry too much - different things 0  Trouble relaxing 0  Restless 0  Easily annoyed or irritable 0  Afraid - awful might happen 0  Total GAD 7 Score 0  Anxiety Difficulty Somewhat difficult    Health Maintenance Due  Topic Date Due   Zoster Vaccines- Shingrix (1 of 2) Never done   Colonoscopy  03/30/2022   Medicare Annual Wellness (AWV)  08/10/2023   COVID-19 Vaccine (4 - 2025-26 season) 01/14/2024      PMH:  The following were reviewed and entered/updated in epic: Past Medical History:  Diagnosis Date   Allergy    Anxiety    On Xanax    Chest pain    Fatigue    GERD (gastroesophageal reflux disease)    Heart murmur    hx of MVP    HTN (hypertension)  Hyperlipemia    IBS (irritable bowel syndrome)    Medication management 06/13/2013   Mitral valve prolapse    Obesity (BMI 30.0-34.9) 08/20/2015   OSA (obstructive sleep apnea)    Restated CPAP   Sleep apnea    no cpap    T2_NIDDM    Testosterone  deficiency 08/20/2015   Type II or unspecified type diabetes mellitus without mention of complication, not stated as uncontrolled    Vitamin D  deficiency     Patient Active Problem List   Diagnosis Date Noted   CKD stage 2 due to type 2 diabetes mellitus (HCC) 10/02/2019   CAD (coronary artery disease) 10/02/2019   Aortic atherosclerosis (HCC) by Chest CT scan 06.23.2020 10/02/2019   Lumbar spondylosis 09/17/2018   Former smoker 03/19/2018   Morbid obesity (HCC) - BMI 30+ with sleep apnea 08/20/2015   Testosterone  deficiency 08/20/2015   Vitamin D  deficiency 06/13/2013   Type 2 diabetes mellitus with stage 2  chronic kidney disease, without long-term current use of insulin  (HCC)    OSA (obstructive sleep apnea)    IBS (irritable bowel syndrome)    HTN (hypertension)    Hyperlipidemia associated with type 2 diabetes mellitus (HCC)    GERD (gastroesophageal reflux disease)    Anxiety     Past Surgical History:  Procedure Laterality Date   COLONOSCOPY     TONSILLECTOMY     WISDOM TOOTH EXTRACTION      Family History  Problem Relation Age of Onset   Stroke Other    Hypertension Other    Hyperlipidemia Other    Diabetes Other    Colon cancer Father 60   Hypertension Father    Dementia Mother    Alzheimer's disease Mother    Colon polyps Neg Hx    Esophageal cancer Neg Hx    Rectal cancer Neg Hx    Stomach cancer Neg Hx     Medications- reviewed and updated Outpatient Medications Prior to Visit  Medication Sig Dispense Refill   Acetaminophen  (TYLENOL  PO) Take by mouth.     aspirin 81 MG tablet Take 81 mg by mouth daily.     atenolol  (TENORMIN ) 100 MG tablet TAKE ONE TABLET BY MOUTH EVERY DAY FOR BLOOD PRESSURE 90 tablet 0   citalopram  (CELEXA ) 40 MG tablet take 1 tablet by mouth daily for mood 90 tablet 0   Continuous Glucose Sensor (FREESTYLE LIBRE 3 PLUS SENSOR) MISC Change sensor every 15 days. 6 each 3   ibuprofen (ADVIL) 800 MG tablet Take 800 mg by mouth every 8 (eight) hours as needed.     losartan -hydrochlorothiazide (HYZAAR) 100-25 MG tablet Take  1 tablet Daily for BP                                                                                                   /  TAKE                                         BY                                                 MOUTH                ONE ? ? ? TIME ? ? ? DAILY 90 tablet 3   Magnesium  400 MG CAPS Take 1 capsuile Daily     metFORMIN  (GLUCOPHAGE -XR) 500 MG 24 hr tablet Take  2 tablets  2 x / day with Meals for Diabetes                                                                  /                                                                   TAKE                                         BY                                                 MOUTH 360 tablet 3   rosuvastatin  (CRESTOR ) 20 MG tablet Take  1 tablet  Daily  for Cholesterol                                         /                       TAKE                                         BY                                             MOUTH                                                    ONE TIME DAILY 90 tablet  3   tadalafil  (CIALIS ) 20 MG tablet TAKE HALF TO ONE TABLET BY MOUTH EVERY 2 TO 3 DAYS AS NEEDED FOR SEX 30 tablet 0   VITAMIN D  PO Take 6,000 Units by mouth daily.     zinc  gluconate 50 MG tablet Take 50 mg by mouth daily.     tamsulosin  (FLOMAX ) 0.4 MG CAPS capsule Take 1 capsule (0.4 mg total) by mouth daily. (Patient not taking: Reported on 03/07/2024) 30 capsule 3   glucose blood (ONETOUCH VERIO) test strip Check blood sugar 1 time a day-DX-E11.22. (Patient not taking: Reported on 03/07/2024) 100 each 4   HYDROcodone -acetaminophen  (NORCO/VICODIN) 5-325 MG tablet Take by mouth. (Patient not taking: Reported on 03/07/2024)     penicillin v potassium (VEETID) 500 MG tablet Take 500 mg by mouth 4 (four) times daily. (Patient not taking: Reported on 03/07/2024)     phenol (CHLORASEPTIC) 1.4 % LIQD Use as directed 1 spray in the mouth or throat as needed for throat irritation / pain. (Patient not taking: Reported on 03/07/2024) 118 mL 0   No facility-administered medications prior to visit.    No Known Allergies  Social History   Socioeconomic History   Marital status: Married    Spouse name: Not on file   Number of children: Not on file   Years of education: Not on file   Highest education level: Not on file  Occupational History   Not on file  Tobacco Use   Smoking status: Former    Current packs/day: 0.00    Average packs/day: 1 pack/day for 19.3 years (19.3 ttl pk-yrs)    Types:  Cigarettes    Start date: 56    Quit date: 09/06/2007    Years since quitting: 16.5   Smokeless tobacco: Never  Vaping Use   Vaping status: Never Used  Substance and Sexual Activity   Alcohol use: Yes    Comment: occasional   Drug use: No   Sexual activity: Not on file  Other Topics Concern   Not on file  Social History Narrative   Not on file   Social Drivers of Health   Financial Resource Strain: Not on file  Food Insecurity: Not on file  Transportation Needs: Not on file  Physical Activity: Not on file  Stress: Not on file  Social Connections: Not on file           Objective:  Physical Exam: BP (!) 152/78   Pulse 67   Temp (!) 95.3 F (35.2 C)   Ht 6' (1.829 m)   Wt 235 lb 9.6 oz (106.9 kg)   SpO2 98%   BMI 31.95 kg/m   Body mass index is 31.95 kg/m. Wt Readings from Last 3 Encounters:  03/07/24 235 lb 9.6 oz (106.9 kg)  02/29/24 220 lb 7.4 oz (100 kg)  12/06/23 221 lb 12.8 oz (100.6 kg)     Physical Exam  GENERAL: Alert, cooperative, well developed, no acute distress HEENT: Normocephalic, normal oropharynx, moist mucous membranes CHEST: Clear to auscultation bilaterally, No wheezes, rhonchi, or crackles CARDIOVASCULAR: Normal heart rate and rhythm, S1 and S2 normal without murmurs ABDOMEN: Soft, non-tender, non-distended, without organomegaly, Normal bowel sounds EXTREMITIES: No cyanosis or edema NEUROLOGICAL: Cranial nerves grossly intact, Moves all extremities without gross motor or sensory deficit  Physical Exam   Prior labs:   Recent Results (from the past 2160 hours)  PSA     Status: None   Collection Time: 12/10/23  8:44 AM  Result Value  Ref Range   PSA 2.37 0.10 - 4.00 ng/mL    Comment: Test performed using [employer] PSA Assay, a parmagnetic partical, chemiluminecent immunoassay.  Comprehensive metabolic panel with GFR     Status: Abnormal   Collection Time: 12/10/23  8:44 AM  Result Value Ref Range   Sodium 137 135 - 145  mEq/L   Potassium 3.8 3.5 - 5.1 mEq/L   Chloride 98 96 - 112 mEq/L   CO2 32 19 - 32 mEq/L   Glucose, Bld 139 (H) 70 - 99 mg/dL   BUN 20 6 - 23 mg/dL   Creatinine, Ser 9.15 0.40 - 1.50 mg/dL   Total Bilirubin 0.6 0.2 - 1.2 mg/dL   Alkaline Phosphatase 60 39 - 117 U/L   AST 19 0 - 37 U/L   ALT 15 0 - 53 U/L   Total Protein 7.2 6.0 - 8.3 g/dL   Albumin 4.3 3.5 - 5.2 g/dL   GFR 12.29 >39.99 mL/min    Comment: Calculated using the CKD-EPI Creatinine Equation (2021)   Calcium  10.0 8.4 - 10.5 mg/dL  CBC with Differential/Platelet     Status: Abnormal   Collection Time: 12/10/23  8:44 AM  Result Value Ref Range   WBC 6.9 4.0 - 10.5 K/uL   RBC 4.36 4.22 - 5.81 Mil/uL   Hemoglobin 13.4 13.0 - 17.0 g/dL   HCT 59.8 60.9 - 47.9 %   MCV 92.1 78.0 - 100.0 fl   MCHC 33.4 30.0 - 36.0 g/dL   RDW 86.3 88.4 - 84.4 %   Platelets 237.0 150.0 - 400.0 K/uL   Neutrophils Relative % 38.9 (L) 43.0 - 77.0 %   Lymphocytes Relative 50.4 (H) 12.0 - 46.0 %   Monocytes Relative 6.4 3.0 - 12.0 %   Eosinophils Relative 3.3 0.0 - 5.0 %   Basophils Relative 1.0 0.0 - 3.0 %   Neutro Abs 2.7 1.4 - 7.7 K/uL   Lymphs Abs 3.5 0.7 - 4.0 K/uL   Monocytes Absolute 0.4 0.1 - 1.0 K/uL   Eosinophils Absolute 0.2 0.0 - 0.7 K/uL   Basophils Absolute 0.1 0.0 - 0.1 K/uL  Vitamin D  1,25 dihydroxy     Status: None   Collection Time: 12/10/23  8:44 AM  Result Value Ref Range   Vitamin D  1, 25 (OH)2 Total 39 18 - 72 pg/mL   Vitamin D3 1, 25 (OH)2 39 pg/mL   Vitamin D2 1, 25 (OH)2 <8 pg/mL    Comment: (Note) Vitamin D3, 1,25(OH)2 indicates both endogenous  production and supplementation. Vitamin D2, 1,25(OH)2 is  an indicator of exogenous sources, such as diet or  supplementation. Interpretation and therapy are based on  measurement of Vitamin D , 1,25 (OH)2, Total. . This test was developed, and its analytical performance  characteristics have been determined by [employer]. It has not been cleared or approved by  the  FDA. This assay has been validated pursuant to the CLIA  regulations and is used for clinical purposes. . For additional information, please refer to http://education.[facility].com/faq/FAQ199 (This link is being provided for  informational/educational purposes only.) . [facility] [address] [address] [place] [address] [phone] Johanna Agent L. Gino, MD, PhD   Lipid panel     Status: Abnormal   Collection Time: 12/10/23  8:44 AM  Result Value Ref Range   Cholesterol 106 0 - 200 mg/dL    Comment: ATP III Classification       Desirable:  < 200  mg/dL               Borderline High:  200 - 239 mg/dL          High:  > = 759 mg/dL   Triglycerides 24.9 0.0 - 149.0 mg/dL    Comment: Normal:  <849 mg/dLBorderline High:  150 - 199 mg/dL   HDL 64.99 (L) >60.99 mg/dL   VLDL 84.9 0.0 - 59.9 mg/dL   LDL Cholesterol 56 0 - 99 mg/dL   Total CHOL/HDL Ratio 3     Comment:                Men          Women1/2 Average Risk     3.4          3.3Average Risk          5.0          4.42X Average Risk          9.6          7.13X Average Risk          15.0          11.0                       NonHDL 70.61     Comment: NOTE:  Non-HDL goal should be 30 mg/dL higher than patient's LDL goal (i.e. LDL goal of < 70 mg/dL, would have non-HDL goal of < 100 mg/dL)  TSH     Status: None   Collection Time: 12/10/23  8:44 AM  Result Value Ref Range   TSH 2.58 0.35 - 5.50 uIU/mL  CBG monitoring, ED     Status: Abnormal   Collection Time: 02/29/24 11:24 PM  Result Value Ref Range   Glucose-Capillary 285 (H) 70 - 99 mg/dL    Comment: Glucose reference range applies only to samples taken after fasting for at least 8 hours.  CBC     Status: Abnormal   Collection Time: 02/29/24 11:38 PM  Result Value Ref Range   WBC 8.7 4.0 - 10.5 K/uL   RBC 4.11 (L) 4.22 - 5.81 MIL/uL   Hemoglobin 12.6 (L) 13.0 - 17.0 g/dL   HCT 61.3 (L) 60.9 - 47.9 %   MCV 93.9 80.0 - 100.0 fL   MCH 30.7  26.0 - 34.0 pg   MCHC 32.6 30.0 - 36.0 g/dL   RDW 87.5 88.4 - 84.4 %   Platelets 250 150 - 400 K/uL   nRBC 0.0 0.0 - 0.2 %    Comment: Performed at [facility], [address] [place], [place] [address]  Urinalysis, Routine w reflex microscopic -Urine, Clean Catch     Status: Abnormal   Collection Time: 02/29/24 11:38 PM  Result Value Ref Range   Color, Urine YELLOW YELLOW   APPearance CLEAR CLEAR   Specific Gravity, Urine 1.021 1.005 - 1.030   pH 5.0 5.0 - 8.0   Glucose, UA >=500 (A) NEGATIVE mg/dL   Hgb urine dipstick NEGATIVE NEGATIVE   Bilirubin Urine NEGATIVE NEGATIVE   Ketones, ur NEGATIVE NEGATIVE mg/dL   Protein, ur NEGATIVE NEGATIVE mg/dL   Nitrite NEGATIVE NEGATIVE   Leukocytes,Ua NEGATIVE NEGATIVE   RBC / HPF 0-5 0 - 5 RBC/hpf   WBC, UA 0-5 0 - 5 WBC/hpf   Bacteria, UA RARE (A) NONE SEEN   Squamous Epithelial / HPF 0-5 0 - 5 /HPF   Mucus PRESENT  Sperm, UA PRESENT     Comment: Performed at [facility], [address], [place], [place] [address]  Comprehensive metabolic panel     Status: Abnormal   Collection Time: 02/29/24 11:38 PM  Result Value Ref Range   Sodium 135 135 - 145 mmol/L   Potassium 4.1 3.5 - 5.1 mmol/L   Chloride 97 (L) 98 - 111 mmol/L   CO2 30 22 - 32 mmol/L   Glucose, Bld 253 (H) 70 - 99 mg/dL    Comment: Glucose reference range applies only to samples taken after fasting for at least 8 hours.   BUN 13 8 - 23 mg/dL   Creatinine, Ser 9.17 0.61 - 1.24 mg/dL   Calcium  9.9 8.9 - 10.3 mg/dL   Total Protein 7.3 6.5 - 8.1 g/dL   Albumin 4.1 3.5 - 5.0 g/dL   AST 20 15 - 41 U/L   ALT 14 0 - 44 U/L   Alkaline Phosphatase 81 38 - 126 U/L   Total Bilirubin 0.4 0.0 - 1.2 mg/dL   GFR, Estimated >39 >39 mL/min    Comment: (NOTE) Calculated using the CKD-EPI Creatinine Equation (2021)    Anion gap 8 5 - 15    Comment: Performed at [facility], [address], [place], [place] [address]   I-Stat CG4 Lactic Acid, ED     Status: None   Collection Time: 02/29/24 11:59 PM  Result Value Ref Range   Lactic Acid, Venous 1.3 0.5 - 1.9 mmol/L  I-stat chem 8, ed     Status: Abnormal   Collection Time: 03/01/24 12:01 AM  Result Value Ref Range   Sodium 137 135 - 145 mmol/L   Potassium 4.2 3.5 - 5.1 mmol/L   Chloride 95 (L) 98 - 111 mmol/L   BUN 13 8 - 23 mg/dL   Creatinine, Ser 8.99 0.61 - 1.24 mg/dL   Glucose, Bld 745 (H) 70 - 99 mg/dL    Comment: Glucose reference range applies only to samples taken after fasting for at least 8 hours.   Calcium , Ion 1.22 1.15 - 1.40 mmol/L   TCO2 28 22 - 32 mmol/L   Hemoglobin 13.6 13.0 - 17.0 g/dL   HCT 59.9 60.9 - 47.9 %  CBG monitoring, ED     Status: Abnormal   Collection Time: 03/01/24  5:25 AM  Result Value Ref Range   Glucose-Capillary 186 (H) 70 - 99 mg/dL    Comment: Glucose reference range applies only to samples taken after fasting for at least 8 hours.  HM DIABETES EYE EXAM     Status: None   Collection Time: 03/06/24 12:00 AM  Result Value Ref Range   HM Diabetic Eye Exam No Retinopathy No Retinopathy    Comment: see result in media tab  POCT HgB A1C     Status: Abnormal   Collection Time: 03/07/24  4:43 PM  Result Value Ref Range   Hemoglobin A1C 8.5 (A) 4.0 - 5.6 %   HbA1c POC (<> result, manual entry)     HbA1c, POC (prediabetic range)     HbA1c, POC (controlled diabetic range)      Lab Results  Component Value Date   CHOL 106 12/10/2023   CHOL 121 03/15/2023   CHOL 129 11/09/2022   Lab Results  Component Value Date   HDL 35.00 (L) 12/10/2023   HDL 39 (L) 03/15/2023   HDL 43 11/09/2022   Lab Results  Component Value Date  LDLCALC 56 12/10/2023   LDLCALC 65 03/15/2023   LDLCALC 68 11/09/2022   Lab Results  Component Value Date   TRIG 75.0 12/10/2023   TRIG 85 03/15/2023   TRIG 99 11/09/2022   Lab Results  Component Value Date   CHOLHDL 3 12/10/2023   CHOLHDL 3.1 03/15/2023   CHOLHDL 3.0  11/09/2022   No results found for: LDLDIRECT  Last metabolic panel Lab Results  Component Value Date   GLUCOSE 254 (H) 03/01/2024   NA 137 03/01/2024   K 4.2 03/01/2024   CL 95 (L) 03/01/2024   CO2 30 02/29/2024   BUN 13 03/01/2024   CREATININE 1.00 03/01/2024   EGFR 93 03/15/2023   CALCIUM  9.9 02/29/2024   PROT 7.3 02/29/2024   ALBUMIN 4.1 02/29/2024   BILITOT 0.4 02/29/2024   ALKPHOS 81 02/29/2024   AST 20 02/29/2024   ALT 14 02/29/2024   ANIONGAP 8 02/29/2024    Lab Results  Component Value Date   HGBA1C 8.5 (A) 03/07/2024    Last CBC Lab Results  Component Value Date   WBC 8.7 02/29/2024   HGB 13.6 03/01/2024   HCT 40.0 03/01/2024   MCV 93.9 02/29/2024   MCH 30.7 02/29/2024   RDW 12.4 02/29/2024   PLT 250 02/29/2024    Lab Results  Component Value Date   TSH 2.58 12/10/2023    Lab Results  Component Value Date   PSA 2.37 12/10/2023   PSA 1.25 11/09/2022   PSA 1.13 06/06/2021    Last vitamin D  Lab Results  Component Value Date   VD25OH 98 11/09/2022    Lab Results  Component Value Date   BILIRUBINUR NEGATIVE 02/29/2024   PROTEINUR NEGATIVE 02/29/2024   LEUKOCYTESUR NEGATIVE 02/29/2024    Lab Results  Component Value Date   MICROALBUR 0.9 12/06/2023   MICROALBUR 0.3 11/09/2022     At today's visit, we discussed treatment options, associated risk and benefits, and engage in counseling as needed.  Additionally the following were reviewed: Past medical records, past medical and surgical history, family and social background, as well as relevant laboratory results, imaging findings, and specialty notes, where applicable.  This message was generated using dictation software, and as a result, it may contain unintentional typos or errors.  Nevertheless, extensive effort was made to accurately convey at the pertinent aspects of the patient visit.    There may have been are other unrelated non-urgent complaints, but due to the busy schedule and  the amount of time already spent with him, time does not permit to address these issues at today's visit. Another appointment may have or has been requested to review these additional issues.     Arvella Hummer, MD, MS

## 2024-04-14 ENCOUNTER — Ambulatory Visit (HOSPITAL_COMMUNITY)
Admission: EM | Admit: 2024-04-14 | Discharge: 2024-04-14 | Disposition: A | Discharge status: Home / Self Care | Attending: Emergency Medicine | Admitting: Emergency Medicine

## 2024-04-14 ENCOUNTER — Encounter (HOSPITAL_COMMUNITY): Payer: Self-pay | Admitting: *Deleted

## 2024-04-14 ENCOUNTER — Ambulatory Visit: Payer: Self-pay

## 2024-04-14 DIAGNOSIS — R10A1 Flank pain, right side: Secondary | ICD-10-CM | POA: Diagnosis not present

## 2024-04-14 DIAGNOSIS — R197 Diarrhea, unspecified: Secondary | ICD-10-CM | POA: Insufficient documentation

## 2024-04-14 DIAGNOSIS — N10 Acute tubulo-interstitial nephritis: Secondary | ICD-10-CM | POA: Diagnosis not present

## 2024-04-14 DIAGNOSIS — R112 Nausea with vomiting, unspecified: Secondary | ICD-10-CM | POA: Insufficient documentation

## 2024-04-14 LAB — GLUCOSE, POCT (MANUAL RESULT ENTRY): POCT Glucose (KUC): 116 mg/dL — AB (ref 70–99)

## 2024-04-14 LAB — POCT URINE DIPSTICK
Bilirubin, UA: NEGATIVE
Glucose, UA: NEGATIVE mg/dL
Ketones, POC UA: NEGATIVE mg/dL
Leukocytes, UA: NEGATIVE
Nitrite, UA: NEGATIVE
Protein Ur, POC: NEGATIVE mg/dL
Spec Grav, UA: 1.025 (ref 1.010–1.025)
Urobilinogen, UA: 0.2 U/dL
pH, UA: 5.5 (ref 5.0–8.0)

## 2024-04-14 MED ORDER — CEFTRIAXONE SODIUM 1 G IJ SOLR
1.0000 g | Freq: Once | INTRAMUSCULAR | Status: AC
  Administered 2024-04-14: 1 g via INTRAMUSCULAR

## 2024-04-14 MED ORDER — ONDANSETRON 4 MG PO TBDP: 4.0000 mg | ORAL_TABLET | Freq: Three times a day (TID) | ORAL | 0 refills | Status: AC | PRN

## 2024-04-14 MED ORDER — CIPROFLOXACIN HCL 500 MG PO TABS: 500.0000 mg | ORAL_TABLET | Freq: Two times a day (BID) | ORAL | 0 refills | Status: AC

## 2024-04-14 MED ORDER — LIDOCAINE HCL (PF) 1 % IJ SOLN
INTRAMUSCULAR | Status: AC
  Filled 2024-04-14: qty 2

## 2024-04-14 MED ORDER — CEFTRIAXONE SODIUM 1 G IJ SOLR
INTRAMUSCULAR | Status: AC
  Filled 2024-04-14: qty 10

## 2024-04-14 MED ORDER — TAMSULOSIN HCL 0.4 MG PO CAPS: 0.4000 mg | ORAL_CAPSULE | Freq: Every day | ORAL | 0 refills | Status: AC

## 2024-04-14 NOTE — ED Provider Notes (Signed)
 [facility]    CSN: [id number] Arrival date & time: 04/14/24  1448      History   Chief Complaint Chief Complaint  Patient presents with   Emesis   Diarrhea   Back Pain   Chills    HPI Wayne [name] is a 73 y.o. male.   Patient presents with right flank pain that began around 2 AM this morning.  Patient states that he was already awake and that this did not wake him out of his sleep.  Patient states that the pain was so severe that he did vomit a few times and had a few episodes of diarrhea.  Patient states that he has not vomited or had diarrhea since 6 AM this morning.    Patient states that he has had intermittent nausea throughout the day.  Patient states that the flank pain has been constant, but has subsided some in severity.  Patient states that he now has some chills and has also had a weaker than normal urine stream and increased urgency to urinate.  Patient denies hematuria, blood in vomit or stool, known fever, and inability to urinate.  Patient denies any previous history of kidney stones or kidney infections.  The history is provided by the patient and medical records.  Emesis Associated symptoms: diarrhea   Diarrhea Associated symptoms: vomiting   Back Pain   Past Medical History:  Diagnosis Date   Allergy    Anxiety    On Xanax    Chest pain    Fatigue    GERD (gastroesophageal reflux disease)    Heart murmur    hx of MVP    HTN (hypertension)    Hyperlipemia    IBS (irritable bowel syndrome)    Medication management 06/13/2013   Mitral valve prolapse    Obesity (BMI 30.0-34.9) 08/20/2015   OSA (obstructive sleep apnea)    Restated CPAP   Sleep apnea    no cpap    T2_NIDDM    Testosterone  deficiency 08/20/2015   Type II or unspecified type diabetes mellitus without mention of complication, not stated as uncontrolled    Vitamin D  deficiency     Patient Active Problem List   Diagnosis Date Noted   CKD stage 2 due to type 2 diabetes  mellitus (HCC) 10/02/2019   CAD (coronary artery disease) 10/02/2019   Aortic atherosclerosis (HCC) by Chest CT scan 06.23.2020 10/02/2019   Lumbar spondylosis 09/17/2018   Former smoker 03/19/2018   Morbid obesity (HCC) - BMI 30+ with sleep apnea 08/20/2015   Testosterone  deficiency 08/20/2015   Vitamin D  deficiency 06/13/2013   Type 2 diabetes mellitus with stage 2 chronic kidney disease, without long-term current use of insulin  (HCC)    OSA (obstructive sleep apnea)    IBS (irritable bowel syndrome)    HTN (hypertension)    Hyperlipidemia associated with type 2 diabetes mellitus (HCC)    GERD (gastroesophageal reflux disease)    Anxiety     Past Surgical History:  Procedure Laterality Date   COLONOSCOPY     TONSILLECTOMY     WISDOM TOOTH EXTRACTION         Home Medications    Prior to Admission medications   Medication Sig Start Date End Date Taking? Authorizing Provider  aspirin 81 MG tablet Take 81 mg by mouth daily.   Yes [provider]  atenolol  (TENORMIN ) 100 MG tablet TAKE ONE TABLET BY MOUTH EVERY DAY FOR BLOOD PRESSURE 12/21/23  Yes Sebastian Beverley NOVAK,  MD  ciprofloxacin  (CIPRO ) 500 MG tablet Take 1 tablet (500 mg total) by mouth every 12 (twelve) hours. 04/14/24  Yes Johnie, Manvi Guilliams A, NP  losartan -hydrochlorothiazide (HYZAAR) 100-25 MG tablet Take  1 tablet Daily for BP                                                                                                   /                                                                   TAKE                                         BY                                                 MOUTH                ONE ? ? ? TIME ? ? ? DAILY 11/20/23  Yes Sebastian Beverley NOVAK, MD  metFORMIN  (GLUCOPHAGE -XR) 500 MG 24 hr tablet Take  2 tablets  2 x / day with Meals for Diabetes                                                                 /                                                                   TAKE                                          BY                                                 MOUTH 12/03/23  Yes Sebastian Beverley NOVAK, MD  MOUNJARO  2.5 MG/0.5ML Pen Inject 2.5 mg into the skin once a week. 03/03/24  Yes [provider]  ondansetron  (ZOFRAN -ODT)  4 MG disintegrating tablet Take 1 tablet (4 mg total) by mouth every 8 (eight) hours as needed for nausea or vomiting. 04/14/24  Yes Johnie, Shanikka Wonders A, NP  rosuvastatin  (CRESTOR ) 20 MG tablet Take  1 tablet  Daily  for Cholesterol                                         /                       TAKE                                         BY                                             MOUTH                                                    ONE TIME DAILY 11/20/23  Yes Sebastian Beverley NOVAK, MD  tamsulosin  (FLOMAX ) 0.4 MG CAPS capsule Take 1 capsule (0.4 mg total) by mouth daily. 04/14/24  Yes Johnie, Chalyn Amescua A, NP  VITAMIN D  PO Take 6,000 Units by mouth daily.   Yes [provider]  zinc  gluconate 50 MG tablet Take 50 mg by mouth daily.   Yes [provider]  Acetaminophen  (TYLENOL  PO) Take by mouth.    [provider]  citalopram  (CELEXA ) 40 MG tablet take 1 tablet by mouth daily for mood 03/05/24   Sebastian Beverley NOVAK, MD  Continuous Glucose Sensor (FREESTYLE LIBRE 3 PLUS SENSOR) MISC Change sensor every 15 days. 03/04/24   Sebastian Beverley NOVAK, MD  ibuprofen (ADVIL) 800 MG tablet Take 800 mg by mouth every 8 (eight) hours as needed.    [provider]  Magnesium  400 MG CAPS Take 1 capsuile Daily 01/25/19   Tonita Fallow, MD  tadalafil  (CIALIS ) 20 MG tablet TAKE HALF TO ONE TABLET BY MOUTH EVERY 2 TO 3 DAYS AS NEEDED FOR SEX 03/05/24   Sebastian Beverley NOVAK, MD  tamsulosin  (FLOMAX ) 0.4 MG CAPS capsule Take 1 capsule (0.4 mg total) by mouth daily. Patient not taking: Reported on 03/07/2024 12/06/23   Sebastian Beverley NOVAK, MD    Family History Family History  Problem Relation Age of Onset   Stroke Other    Hypertension Other     Hyperlipidemia Other    Diabetes Other    Colon cancer Father 74   Hypertension Father    Dementia Mother    Alzheimer's disease Mother    Colon polyps Neg Hx    Esophageal cancer Neg Hx    Rectal cancer Neg Hx    Stomach cancer Neg Hx     Social History Social History   Tobacco Use   Smoking status: Former    Current packs/day: 0.00    Average packs/day: 1 pack/day for 19.3 years (19.3 ttl pk-yrs)    Types: Cigarettes    Start date: 60    Quit date: 09/06/2007  Years since quitting: 16.6   Smokeless tobacco: Never  Vaping Use   Vaping status: Never Used  Substance Use Topics   Alcohol use: Yes    Comment: occasional   Drug use: No     Allergies   Patient has no known allergies.   Review of Systems Review of Systems  Gastrointestinal:  Positive for diarrhea and vomiting.  Musculoskeletal:  Positive for back pain.   Per HPI  Physical Exam Triage Vital Signs ED Triage Vitals  Encounter Vitals Group     BP 04/14/24 1642 128/73     Girls Systolic BP Percentile --      Girls Diastolic BP Percentile --      Boys Systolic BP Percentile --      Boys Diastolic BP Percentile --      Pulse Rate 04/14/24 1642 91     Resp 04/14/24 1642 18     Temp 04/14/24 1642 98.5 F (36.9 C)     Temp Source 04/14/24 1642 Oral     SpO2 04/14/24 1642 94 %     Weight --      Height --      Head Circumference --      Peak Flow --      Pain Score 04/14/24 1639 6     Pain Loc --      Pain Education --      Exclude from Growth Chart --    No data found.  Updated Vital Signs BP 128/73 (BP Location: Left Arm)   Pulse 91   Temp 98.5 F (36.9 C) (Oral)   Resp 18   SpO2 94%   Visual Acuity Right Eye Distance:   Left Eye Distance:   Bilateral Distance:    Right Eye Near:   Left Eye Near:    Bilateral Near:     Physical Exam Vitals and nursing note reviewed.  Constitutional:      General: He is awake. He is not in acute distress.    Appearance: Normal appearance.  He is well-developed and well-groomed. He is not ill-appearing.  Cardiovascular:     Rate and Rhythm: Normal rate and regular rhythm.  Pulmonary:     Effort: Pulmonary effort is normal.     Breath sounds: Normal breath sounds.  Abdominal:     General: Abdomen is flat. Bowel sounds are normal.     Palpations: Abdomen is soft.     Tenderness: There is no abdominal tenderness. There is right CVA tenderness. There is no guarding or rebound.  Skin:    General: Skin is warm and dry.  Neurological:     Mental Status: He is alert.  Psychiatric:        Behavior: Behavior is cooperative.      UC Treatments / Results  Labs (all labs ordered are listed, but only abnormal results are displayed) Labs Reviewed  POCT URINE DIPSTICK - Abnormal; Notable for the following components:      Result Value   Blood, UA small (*)    All other components within normal limits  GLUCOSE, POCT (MANUAL RESULT ENTRY) - Abnormal; Notable for the following components:   POCT Glucose ([facility]) 116 (*)    All other components within normal limits  URINE CULTURE    EKG   Radiology No results found.  Procedures Procedures (including critical care time)  Medications Ordered in UC Medications  cefTRIAXone (ROCEPHIN) injection 1 g (1 g Intramuscular Given 04/14/24 1752)    Initial Impression /  Assessment and Plan / UC Course  I have reviewed the triage vital signs and the nursing notes.  Pertinent labs & imaging results that were available during my care of the patient were reviewed by me and considered in my medical decision making (see chart for details).     Patient is overall well-appearing.  Vitals are stable.  Right CVA tenderness noted on exam.  No other significant findings noted on exam.  Urinalysis reveals small RBCs, without any other findings.  Will send urine culture to confirm presence of infection.  CBG within normal limits.  Empirically treated for pyelonephritis due to presence of urinary  symptoms, flank pain, and chills.  Denied Rocephin in clinic for initial treatment of this.  Prescribed ciprofloxacin for additional treatment pyelonephritis.  Prescribed Zofran as needed for nausea and vomiting.  Prescribed Flomax  to take in the event that pain is related to a possible kidney stone.  Discussed follow-up, return, and strict ER precautions. Final Clinical Impressions(s) / UC Diagnoses   Final diagnoses:  Nausea, vomiting, and diarrhea  Right flank pain  Acute pyelonephritis     Discharge Instructions      You were given an injection of Rocephin in clinic for initial treatment of possible kidney infection. Start taking ciprofloxacin twice daily for 5 days for additional treatment of this. Your urine culture will return over the next few days and someone will call if results require any adjustment in treatment. Start taking tamsulosin  once daily to help pass possible kidney stone. Take Zofran every 8 hours as needed for nausea and vomiting. If you develop worsening back pain, excessive vomiting, excessive diarrhea, blood in vomit or stool, blood in urine, inability to urinate, fever, weakness, or confusion please seek immediate medical treatment in the emergency department. Otherwise follow-up with your primary care provider or return here as needed.   ED Prescriptions     Medication Sig Dispense Auth. Provider   tamsulosin  (FLOMAX ) 0.4 MG CAPS capsule Take 1 capsule (0.4 mg total) by mouth daily. 30 capsule Johnie Flaming A, NP   ciprofloxacin (CIPRO) 500 MG tablet Take 1 tablet (500 mg total) by mouth every 12 (twelve) hours. 10 tablet Johnie Flaming A, NP   ondansetron (ZOFRAN-ODT) 4 MG disintegrating tablet Take 1 tablet (4 mg total) by mouth every 8 (eight) hours as needed for nausea or vomiting. 10 tablet Johnie Flaming A, NP      PDMP not reviewed this encounter.   Johnie Flaming A, NP 04/14/24 [phone]

## 2024-04-14 NOTE — Telephone Encounter (Signed)
 FYI Only or Action Required?: FYI only for provider: advised UC.  Patient was last seen in primary care on 03/07/2024 by Sebastian Beverley NOVAK, MD.  Called Nurse Triage reporting Flank Pain.  Symptoms began today.  Interventions attempted: Nothing.  Symptoms are: gradually improving.  Triage Disposition: See Physician Within 24 Hours  Patient/caregiver understands and will follow disposition?: Yes, will follow disposition  Copied from CRM #[id number]. Topic: Clinical - Red Word Triage >> Apr 14, 2024 12:56 PM Ashley R wrote: Red Word that prompted transfer to Nurse Triage: intense pain in back right (kidney area), vomitting, diarrhea. Possible interaction with mucinex and current medications. Unable to lay down/sleep.    ----------------------------------------------------------------------- From previous Reason for Contact - Scheduling: Patient/patient representative is calling to schedule an appointment. Refer to attachments for appointment information. Reason for Disposition  MODERATE pain (e.g., interferes with normal activities or awakens from sleep)  Answer Assessment - Initial Assessment Questions 1. LOCATION: Where does it hurt? (e.g., left, right)     R flank pain 2. ONSET: When did the pain start?     0300 today 3. SEVERITY: How bad is the pain? (e.g., Scale 1-10; mild, moderate, or severe)     6 currently, has not taken OTC 4. PATTERN: Does the pain come and go, or is it constant?      constant 5. CAUSE: What do you think is causing the pain?     unsure 6. OTHER SYMPTOMS:  Do you have any other symptoms? (e.g., fever, abdomen pain, vomiting, leg weakness, burning with urination, blood in urine)    Fluctuations in urinary stream- started this morning,  Vomiting and diarrhea this morning for a few hours, denies blood in urine,  Pt advised to utilize UC. No appts available at clinic.  Protocols used: Flank Pain-A-AH

## 2024-04-14 NOTE — Discharge Instructions (Signed)
 You were given an injection of Rocephin in clinic for initial treatment of possible kidney infection. Start taking ciprofloxacin twice daily for 5 days for additional treatment of this. Your urine culture will return over the next few days and someone will call if results require any adjustment in treatment. Start taking tamsulosin  once daily to help pass possible kidney stone. Take Zofran every 8 hours as needed for nausea and vomiting. If you develop worsening back pain, excessive vomiting, excessive diarrhea, blood in vomit or stool, blood in urine, inability to urinate, fever, weakness, or confusion please seek immediate medical treatment in the emergency department. Otherwise follow-up with your primary care provider or return here as needed.

## 2024-04-14 NOTE — ED Triage Notes (Signed)
 Pt states he started vomiting and diarrhea this morning at 2am. No vomiting or diarrhea since 6am. He now has right lower back pain and decreased urine stream. He took some mucinex last night

## 2024-04-15 LAB — URINE CULTURE: Culture: NO GROWTH

## 2024-04-15 NOTE — Telephone Encounter (Signed)
 Noted. Patient was triaged by nurse triage and went to urgent care and has an appointment with Dr. Sebastian tomorrow.

## 2024-04-16 ENCOUNTER — Encounter: Payer: Self-pay | Admitting: Family Medicine

## 2024-04-16 ENCOUNTER — Ambulatory Visit (HOSPITAL_COMMUNITY): Payer: Self-pay

## 2024-04-16 ENCOUNTER — Ambulatory Visit: Admitting: Family Medicine

## 2024-04-16 VITALS — BP 128/72 | HR 79 | Temp 97.8°F | Ht 72.0 in | Wt 222.6 lb

## 2024-04-16 DIAGNOSIS — R3129 Other microscopic hematuria: Secondary | ICD-10-CM

## 2024-04-16 DIAGNOSIS — E1122 Type 2 diabetes mellitus with diabetic chronic kidney disease: Secondary | ICD-10-CM

## 2024-04-16 DIAGNOSIS — N182 Chronic kidney disease, stage 2 (mild): Secondary | ICD-10-CM | POA: Diagnosis not present

## 2024-04-16 DIAGNOSIS — N200 Calculus of kidney: Secondary | ICD-10-CM

## 2024-04-16 DIAGNOSIS — I10 Essential (primary) hypertension: Secondary | ICD-10-CM | POA: Diagnosis not present

## 2024-04-16 LAB — COMPREHENSIVE METABOLIC PANEL WITH GFR
ALT: 18 U/L (ref 0–53)
AST: 24 U/L (ref 0–37)
Albumin: 4.4 g/dL (ref 3.5–5.2)
Alkaline Phosphatase: 64 U/L (ref 39–117)
BUN: 31 mg/dL — ABNORMAL HIGH (ref 6–23)
CO2: 31 meq/L (ref 19–32)
Calcium: 10 mg/dL (ref 8.4–10.5)
Chloride: 97 meq/L (ref 96–112)
Creatinine, Ser: 1.3 mg/dL (ref 0.40–1.50)
GFR: 55.11 mL/min — ABNORMAL LOW (ref >60.00)
Glucose, Bld: 138 mg/dL — ABNORMAL HIGH (ref 70–99)
Potassium: 4.4 meq/L (ref 3.5–5.1)
Sodium: 136 meq/L (ref 135–145)
Total Bilirubin: 0.7 mg/dL (ref 0.2–1.2)
Total Protein: 7.6 g/dL (ref 6.0–8.3)

## 2024-04-16 LAB — MICROALBUMIN / CREATININE URINE RATIO
Creatinine,U: 166.2 mg/dL
Microalb Creat Ratio: 18.2 mg/g (ref 0.0–30.0)
Microalb, Ur: 3 mg/dL — ABNORMAL HIGH (ref 0.0–1.9)

## 2024-04-16 LAB — PSA: PSA: 1.85 ng/mL (ref 0.10–4.00)

## 2024-04-16 LAB — URIC ACID: Uric Acid, Serum: 8 mg/dL — ABNORMAL HIGH (ref 4.0–7.8)

## 2024-04-16 NOTE — Progress Notes (Signed)
 Assessment & Plan   Assessment/Plan:   Assessment and Plan Assessment & Plan Type 2 diabetes mellitus with chronic kidney disease A1c improved to 7.9% but remains above goal. Recent illness with nausea and vomiting may have contributed to weight loss. Current treatment includes Mounjaro  2.5 mg weekly and metformin  1000 mg twice daily. Continuous glucose monitor Freestyle Libre defective, awaiting replacement. Discussed potential impact of increasing Mounjaro  dosage on fluid intake and current recovery status. - Continue Mounjaro  2.5 mg weekly and metformin  1000 mg twice daily. - Ensure replacement of defective Freestyle Libre sensor. - Monitor blood glucose levels. - Encouraged hydration and rest.  Essential hypertension Blood pressure well controlled at 128/72 mmHg. Current medications include atenolol  100 mg daily and losartan -hydrochlorothiazide 100-25 mg daily. - Continue atenolol  100 mg daily and losartan -hydrochlorothiazide 100-25 mg daily.  Nephrolithiasis, likely passed, with microscopic hematuria Recent episode of right flank pain with nausea, vomiting, and diarrhea. Urine dipstick showed small blood, negative nitrates, and leukocytes. Urine culture negative. Likely nephrolithiasis given negative culture and symptoms. Microscopic hematuria noted. No current pain or vomiting, but some nausea and diarrhea persist. Discussed potential for imaging if symptoms recur or labs indicate issues. Emphasized hydration and monitoring for red flag symptoms. - Ordered CMP with EGFR to assess renal function. - Ordered urinalysis with microscopy and reflex to culture. - Ordered PSA to assess for prostate obstruction. - Ordered uric acid level to evaluate for uric acid stones. - Encouraged hydration. - Prescribed ondansetron  for nausea as needed. - Advised return precautions for red flag symptoms such as worsening pain, inability to retain fluids, worsening diarrhea, fevers, chills, or blood in  urine. - Will consider referral to urology if symptoms recur or labs indicate issues.     Return in about 3 months (around 07/15/2024) for DM, BP, fasting labs, HLD.    There are no discontinued medications.          Subjective:   Encounter date: 04/16/2024  Wayne [name] is a 73 y.o. male who has HTN (hypertension); Hyperlipidemia associated with type 2 diabetes mellitus (HCC); GERD (gastroesophageal reflux disease); Anxiety; Type 2 diabetes mellitus with stage 2 chronic kidney disease, without long-term current use of insulin  (HCC); OSA (obstructive sleep apnea); IBS (irritable bowel syndrome); Vitamin D  deficiency; Morbid obesity (HCC) - BMI 30+ with sleep apnea; Testosterone  deficiency; Former smoker; CKD stage 2 due to type 2 diabetes mellitus (HCC); CAD (coronary artery disease); Aortic atherosclerosis (HCC) by Chest CT scan 06.23.2020; and Lumbar spondylosis on their problem list..   He  has a past medical history of Allergy, Anxiety, Chest pain, Fatigue, GERD (gastroesophageal reflux disease), Heart murmur, HTN (hypertension), Hyperlipemia, IBS (irritable bowel syndrome), Medication management (06/13/2013), Mitral valve prolapse, Obesity (BMI 30.0-34.9) (08/20/2015), OSA (obstructive sleep apnea), Sleep apnea, T2_NIDDM, Testosterone  deficiency (08/20/2015), Type II or unspecified type diabetes mellitus without mention of complication, not stated as uncontrolled, and Vitamin D  deficiency..  Discussed the use of AI scribe software for clinical note transcription with the patient, who gave verbal consent to proceed.  History of Present Illness Christon A Baltimore is a 73 year old male with diabetes and hypertension who presents for follow-up and evaluation of recent right flank pain with nausea, vomiting, and diarrhea.  Right flank pain and gastrointestinal symptoms - Acute onset of severe right flank pain radiating to the kidney area began Sunday night - Associated with severe vomiting,  nausea, and diarrhea - Pain prevented sleep during the episode - Currently, no pain or vomiting - Mild  persistent nausea, plans to take medication for it - Intermittent diarrhea last night - No urinary pain - Feels drained, lightheaded, and more tired than usual since the episode - Attributes fatigue and lightheadedness to recent illness and fluid loss - Concerned about possible nephrolithiasis given negative urine culture and prior symptoms  Recent urgent care evaluation and treatment - Visited urgent care on April 14, 2024 for above symptoms - Urine dipstick showed small blood, negative for nitrates and leukocytes - Urine culture negative - Received ceftriaxone  injection - Prescribed tamsulosin  for possible stone, no longer taking it - Completed a course of ciprofloxacin   Diabetes mellitus management - Recent hemoglobin A1c was 8.5, improved to 7.9 - Started on Mounjaro  2.5 mg injection weekly - Continues metformin  1000 mg twice daily - Weight loss of 13 pounds attributed to Mounjaro  and recent illness - Prescribed Freestyle Libre continuous glucose monitor, unable to use due to defective sensor - Unable to assess recent blood glucose levels  Hypertension management - Blood pressure well controlled at 128/72 mmHg - Takes atenolol  100 mg daily and losartan -hydrochlorothiazide 100/25 mg daily  General functional status and hydration - Not a heavy water drinker but increasing fluid intake after recent illness - Desires to return to work, does not want to remain bedridden    ROS  Past Surgical History:  Procedure Laterality Date   COLONOSCOPY     TONSILLECTOMY     WISDOM TOOTH EXTRACTION      Current Outpatient Medications on File Prior to Visit  Medication Sig Dispense Refill   Acetaminophen  (TYLENOL  PO) Take by mouth.     aspirin 81 MG tablet Take 81 mg by mouth daily.     atenolol  (TENORMIN ) 100 MG tablet TAKE ONE TABLET BY MOUTH EVERY DAY FOR BLOOD PRESSURE 90  tablet 0   ciprofloxacin  (CIPRO ) 500 MG tablet Take 1 tablet (500 mg total) by mouth every 12 (twelve) hours. 10 tablet 0   citalopram  (CELEXA ) 40 MG tablet take 1 tablet by mouth daily for mood 90 tablet 0   Continuous Glucose Sensor (FREESTYLE LIBRE 3 PLUS SENSOR) MISC Change sensor every 15 days. 6 each 3   ibuprofen (ADVIL) 800 MG tablet Take 800 mg by mouth every 8 (eight) hours as needed.     losartan -hydrochlorothiazide (HYZAAR) 100-25 MG tablet Take  1 tablet Daily for BP                                                                                                   /                                                                   TAKE  BY                                                 MOUTH                ONE ? ? ? TIME ? ? ? DAILY 90 tablet 3   Magnesium  400 MG CAPS Take 1 capsuile Daily     MOUNJARO  2.5 MG/0.5ML Pen Inject 2.5 mg into the skin once a week.     ondansetron  (ZOFRAN -ODT) 4 MG disintegrating tablet Take 1 tablet (4 mg total) by mouth every 8 (eight) hours as needed for nausea or vomiting. 10 tablet 0   rosuvastatin  (CRESTOR ) 20 MG tablet Take  1 tablet  Daily  for Cholesterol                                         /                       TAKE                                         BY                                             MOUTH                                                    ONE TIME DAILY 90 tablet 3   tadalafil  (CIALIS ) 20 MG tablet TAKE HALF TO ONE TABLET BY MOUTH EVERY 2 TO 3 DAYS AS NEEDED FOR SEX 30 tablet 0   VITAMIN D  PO Take 6,000 Units by mouth daily.     zinc  gluconate 50 MG tablet Take 50 mg by mouth daily.     metFORMIN  (GLUCOPHAGE -XR) 500 MG 24 hr tablet Take  2 tablets  2 x / day with Meals for Diabetes                                                                 /                                                                   TAKE  BY                                                  MOUTH 360 tablet 3   tamsulosin  (FLOMAX ) 0.4 MG CAPS capsule Take 1 capsule (0.4 mg total) by mouth daily. (Patient not taking: Reported on 04/16/2024) 30 capsule 3   tamsulosin  (FLOMAX ) 0.4 MG CAPS capsule Take 1 capsule (0.4 mg total) by mouth daily. (Patient not taking: Reported on 04/16/2024) 30 capsule 0   No current facility-administered medications on file prior to visit.    Family History  Problem Relation Age of Onset   Stroke Other    Hypertension Other    Hyperlipidemia Other    Diabetes Other    Colon cancer Father 57   Hypertension Father    Dementia Mother    Alzheimer's disease Mother    Colon polyps Neg Hx    Esophageal cancer Neg Hx    Rectal cancer Neg Hx    Stomach cancer Neg Hx     Social History   Socioeconomic History   Marital status: Married    Spouse name: Not on file   Number of children: Not on file   Years of education: Not on file   Highest education level: Not on file  Occupational History   Not on file  Tobacco Use   Smoking status: Former    Current packs/day: 0.00    Average packs/day: 1 pack/day for 19.3 years (19.3 ttl pk-yrs)    Types: Cigarettes    Start date: 2    Quit date: 09/06/2007    Years since quitting: 16.6   Smokeless tobacco: Never  Vaping Use   Vaping status: Never Used  Substance and Sexual Activity   Alcohol use: Yes    Comment: occasional   Drug use: No   Sexual activity: Not Currently  Other Topics Concern   Not on file  Social History Narrative   Not on file   Social Drivers of Health   Financial Resource Strain: Not on file  Food Insecurity: Not on file  Transportation Needs: Not on file  Physical Activity: Not on file  Stress: Not on file  Social Connections: Not on file  Intimate Partner Violence: Not on file                                                                                                  Objective:  Physical Exam: BP 128/72   Pulse 79   Temp 97.8 F (36.6  C) (Temporal)   Ht 6' (1.829 m)   Wt 222 lb 9.6 oz (101 kg)   SpO2 99%   BMI 30.19 kg/m   Wt Readings from Last 3 Encounters:  04/16/24 222 lb 9.6 oz (101 kg)  03/07/24 235 lb 9.6 oz (106.9 kg)  02/29/24 220 lb 7.4 oz (100 kg)    Physical Exam           Physical Exam  Constitutional:      Appearance: Normal appearance.  HENT:     Head: Normocephalic and atraumatic.     Right Ear: Hearing normal.     Left Ear: Hearing normal.     Nose: Nose normal.  Eyes:     General: No scleral icterus.       Right eye: No discharge.        Left eye: No discharge.     Extraocular Movements: Extraocular movements intact.  Cardiovascular:     Rate and Rhythm: Normal rate and regular rhythm.     Heart sounds: Normal heart sounds.  Pulmonary:     Effort: Pulmonary effort is normal.     Breath sounds: Normal breath sounds.  Abdominal:     Palpations: Abdomen is soft.     Tenderness: There is no abdominal tenderness. There is no right CVA tenderness or left CVA tenderness.  Skin:    General: Skin is warm.     Findings: No rash.  Neurological:     General: No focal deficit present.     Mental Status: He is alert.     Cranial Nerves: No cranial nerve deficit.  Psychiatric:        Mood and Affect: Mood normal.        Behavior: Behavior normal.        Thought Content: Thought content normal.        Judgment: Judgment normal.     CT Head Wo Contrast Result Date: 03/01/2024 EXAM: CT HEAD WITHOUT CONTRAST 03/01/2024 05:12:13 AM TECHNIQUE: CT of the head was performed without the administration of intravenous contrast. Automated exposure control, iterative reconstruction, and/or weight based adjustment of the mA/kV was utilized to reduce the radiation dose to as low as reasonably achievable. COMPARISON: None available. CLINICAL HISTORY: Head trauma, minor (Age >= 65y). FINDINGS: BRAIN AND VENTRICLES: No acute hemorrhage. No evidence of acute infarct. No hydrocephalus. No extra-axial  collection. No mass effect or midline shift. Typically senescent bilateral basal gangliar mineralization. Atherosclerosis of skullbase vasculature without hyperdense vessel or abnormal calcification. ORBITS: No acute abnormality. SINUSES: No acute abnormality. SOFT TISSUES AND SKULL: No acute soft tissue abnormality. No skull fracture. IMPRESSION: 1. No acute intracranial abnormality related to head trauma. Electronically signed by: Evalene Coho MD 03/01/2024 05:13 AM EDT RP Workstation: [id number]    Recent Results (from the past 2160 hours)  CBG monitoring, ED     Status: Abnormal   Collection Time: 02/29/24 11:24 PM  Result Value Ref Range   Glucose-Capillary 285 (H) 70 - 99 mg/dL    Comment: Glucose reference range applies only to samples taken after fasting for at least 8 hours.  CBC     Status: Abnormal   Collection Time: 02/29/24 11:38 PM  Result Value Ref Range   WBC 8.7 4.0 - 10.5 K/uL   RBC 4.11 (L) 4.22 - 5.81 MIL/uL   Hemoglobin 12.6 (L) 13.0 - 17.0 g/dL   HCT 61.3 (L) 60.9 - 47.9 %   MCV 93.9 80.0 - 100.0 fL   MCH 30.7 26.0 - 34.0 pg   MCHC 32.6 30.0 - 36.0 g/dL   RDW 87.5 88.4 - 84.4 %   Platelets 250 150 - 400 K/uL   nRBC 0.0 0.0 - 0.2 %    Comment: Performed at [facility], [address] [place], [place] [address]  Urinalysis, Routine w reflex microscopic -Urine, Clean Catch     Status: Abnormal   Collection Time: 02/29/24 11:38 PM  Result Value Ref Range   Color, Urine YELLOW YELLOW   APPearance CLEAR CLEAR   Specific Gravity, Urine 1.021 1.005 - 1.030   pH 5.0 5.0 - 8.0   Glucose, UA >=500 (A) NEGATIVE mg/dL   Hgb urine dipstick NEGATIVE NEGATIVE   Bilirubin Urine NEGATIVE NEGATIVE   Ketones, ur NEGATIVE NEGATIVE mg/dL   Protein, ur NEGATIVE NEGATIVE mg/dL   Nitrite NEGATIVE NEGATIVE   Leukocytes,Ua NEGATIVE NEGATIVE   RBC / HPF 0-5 0 - 5 RBC/hpf   WBC, UA 0-5 0 - 5 WBC/hpf   Bacteria, UA RARE (A) NONE SEEN   Squamous Epithelial /  HPF 0-5 0 - 5 /HPF   Mucus PRESENT    Sperm, UA PRESENT     Comment: Performed at [facility], [address], [place], [place] [address]  Comprehensive metabolic panel     Status: Abnormal   Collection Time: 02/29/24 11:38 PM  Result Value Ref Range   Sodium 135 135 - 145 mmol/L   Potassium 4.1 3.5 - 5.1 mmol/L   Chloride 97 (L) 98 - 111 mmol/L   CO2 30 22 - 32 mmol/L   Glucose, Bld 253 (H) 70 - 99 mg/dL    Comment: Glucose reference range applies only to samples taken after fasting for at least 8 hours.   BUN 13 8 - 23 mg/dL   Creatinine, Ser 9.17 0.61 - 1.24 mg/dL   Calcium  9.9 8.9 - 10.3 mg/dL   Total Protein 7.3 6.5 - 8.1 g/dL   Albumin 4.1 3.5 - 5.0 g/dL   AST 20 15 - 41 U/L   ALT 14 0 - 44 U/L   Alkaline Phosphatase 81 38 - 126 U/L   Total Bilirubin 0.4 0.0 - 1.2 mg/dL   GFR, Estimated >39 >39 mL/min    Comment: (NOTE) Calculated using the CKD-EPI Creatinine Equation (2021)    Anion gap 8 5 - 15    Comment: Performed at [facility], [address] [place], [place] [address]  I-Stat CG4 Lactic Acid, ED     Status: None   Collection Time: 02/29/24 11:59 PM  Result Value Ref Range   Lactic Acid, Venous 1.3 0.5 - 1.9 mmol/L  I-stat chem 8, ed     Status: Abnormal   Collection Time: 03/01/24 12:01 AM  Result Value Ref Range   Sodium 137 135 - 145 mmol/L   Potassium 4.2 3.5 - 5.1 mmol/L   Chloride 95 (L) 98 - 111 mmol/L   BUN 13 8 - 23 mg/dL   Creatinine, Ser 8.99 0.61 - 1.24 mg/dL   Glucose, Bld 745 (H) 70 - 99 mg/dL    Comment: Glucose reference range applies only to samples taken after fasting for at least 8 hours.   Calcium , Ion 1.22 1.15 - 1.40 mmol/L   TCO2 28 22 - 32 mmol/L   Hemoglobin 13.6 13.0 - 17.0 g/dL   HCT 59.9 60.9 - 47.9 %  CBG monitoring, ED     Status: Abnormal   Collection Time: 03/01/24  5:25 AM  Result Value Ref Range   Glucose-Capillary 186 (H) 70 - 99 mg/dL    Comment: Glucose reference range applies  only to samples taken after fasting for at least 8 hours.  HM DIABETES EYE EXAM     Status: None   Collection Time: 03/06/24 12:00 AM  Result Value Ref Range   HM Diabetic Eye Exam No Retinopathy No Retinopathy    Comment: see result  in media tab  POCT HgB A1C     Status: Abnormal   Collection Time: 03/07/24  4:43 PM  Result Value Ref Range   Hemoglobin A1C 8.5 (A) 4.0 - 5.6 %   HbA1c POC (<> result, manual entry)     HbA1c, POC (prediabetic range)     HbA1c, POC (controlled diabetic range)    POCT glucose (manual entry)     Status: Abnormal   Collection Time: 04/14/24  4:51 PM  Result Value Ref Range   POCT Glucose ([facility]) 116 (A) 70 - 99 mg/dL  POC Urinalysis Dipstick     Status: Abnormal   Collection Time: 04/14/24  5:32 PM  Result Value Ref Range   Color, UA yellow yellow   Clarity, UA clear clear   Glucose, UA negative negative mg/dL   Bilirubin, UA negative negative   Ketones, POC UA negative negative mg/dL   Spec Grav, UA 8.974 8.989 - 1.025   Blood, UA small (A) negative   pH, UA 5.5 5.0 - 8.0   Protein Ur, POC negative negative mg/dL   Urobilinogen, UA 0.2 0.2 or 1.0 E.U./dL   Nitrite, UA Negative Negative   Leukocytes, UA Negative Negative  Urine Culture     Status: None   Collection Time: 04/14/24  5:49 PM   Specimen: Urine, Clean Catch  Result Value Ref Range   Specimen Description URINE, CLEAN CATCH    Special Requests NONE    Culture      NO GROWTH Performed at [facility], [address], [place], [place] [address]    Report Status 04/15/2024 FINAL         Beverley KATHEE Hummer, MD  I,Emily Lagle,acting as a scribe for Beverley KATHEE Hummer, MD.,have documented all relevant documentation on the behalf of Beverley KATHEE Hummer, MD.  LILLETTE Beverley KATHEE Hummer, MD, have reviewed all documentation for this visit. The documentation on 04/16/2024 for the exam, diagnosis, procedures, and orders are all accurate and complete.

## 2024-04-17 ENCOUNTER — Encounter: Payer: Self-pay | Admitting: Family Medicine

## 2024-04-17 LAB — URINALYSIS W MICROSCOPIC + REFLEX CULTURE
Bilirubin Urine: NEGATIVE
Glucose, UA: NEGATIVE
Ketones, ur: NEGATIVE
Leukocyte Esterase: NEGATIVE
Nitrites, Initial: NEGATIVE
Protein, ur: NEGATIVE
Specific Gravity, Urine: 1.019 (ref 1.001–1.035)
pH: <=5 — AB (ref 5.0–8.0)

## 2024-04-17 LAB — NO CULTURE INDICATED

## 2024-04-20 ENCOUNTER — Ambulatory Visit: Payer: Self-pay | Admitting: Family Medicine

## 2024-04-20 MED ORDER — FREESTYLE LIBRE 3 PLUS SENSOR MISC: 3 refills | Status: AC

## 2024-06-04 ENCOUNTER — Other Ambulatory Visit: Payer: Self-pay | Admitting: Family Medicine

## 2024-06-04 DIAGNOSIS — I10 Essential (primary) hypertension: Secondary | ICD-10-CM

## 2024-06-04 DIAGNOSIS — F419 Anxiety disorder, unspecified: Secondary | ICD-10-CM
